# Patient Record
Sex: Female | Born: 1945 | Race: White | Hispanic: No | Marital: Married | Smoking: Never smoker
Health system: Southern US, Community
[De-identification: ages and names within clinical notes are randomized; demographics above are authoritative.]

## PROBLEM LIST (undated history)

## (undated) DIAGNOSIS — H269 Unspecified cataract: Secondary | ICD-10-CM

## (undated) DIAGNOSIS — E0500 Thyrotoxicosis with diffuse goiter without thyrotoxic crisis or storm: Secondary | ICD-10-CM

## (undated) DIAGNOSIS — I10 Essential (primary) hypertension: Secondary | ICD-10-CM

## (undated) DIAGNOSIS — T8859XA Other complications of anesthesia, initial encounter: Secondary | ICD-10-CM

## (undated) DIAGNOSIS — I4891 Unspecified atrial fibrillation: Secondary | ICD-10-CM

## (undated) DIAGNOSIS — N6019 Diffuse cystic mastopathy of unspecified breast: Secondary | ICD-10-CM

## (undated) DIAGNOSIS — E669 Obesity, unspecified: Secondary | ICD-10-CM

## (undated) DIAGNOSIS — S4291XA Fracture of right shoulder girdle, part unspecified, initial encounter for closed fracture: Secondary | ICD-10-CM

## (undated) DIAGNOSIS — Z87442 Personal history of urinary calculi: Secondary | ICD-10-CM

## (undated) DIAGNOSIS — T884XXA Failed or difficult intubation, initial encounter: Secondary | ICD-10-CM

## (undated) DIAGNOSIS — D649 Anemia, unspecified: Secondary | ICD-10-CM

## (undated) DIAGNOSIS — K8020 Calculus of gallbladder without cholecystitis without obstruction: Secondary | ICD-10-CM

## (undated) DIAGNOSIS — T4145XA Adverse effect of unspecified anesthetic, initial encounter: Secondary | ICD-10-CM

## (undated) HISTORY — PX: TUBAL LIGATION: SHX77

## (undated) HISTORY — PX: ESOPHAGOGASTRODUODENOSCOPY ENDOSCOPY: SHX5814

## (undated) HISTORY — DX: Anemia, unspecified: D64.9

## (undated) HISTORY — DX: Fracture of right shoulder girdle, part unspecified, initial encounter for closed fracture: S42.91XA

## (undated) HISTORY — DX: Obesity, unspecified: E66.9

## (undated) HISTORY — DX: Unspecified atrial fibrillation: I48.91

## (undated) HISTORY — DX: Unspecified cataract: H26.9

## (undated) HISTORY — DX: Diffuse cystic mastopathy of unspecified breast: N60.19

## (undated) HISTORY — DX: Essential (primary) hypertension: I10

## (undated) HISTORY — DX: Thyrotoxicosis with diffuse goiter without thyrotoxic crisis or storm: E05.00

---

## 1998-12-26 ENCOUNTER — Other Ambulatory Visit: Admission: RE | Admit: 1998-12-26 | Discharge: 1998-12-26 | Payer: Self-pay | Admitting: *Deleted

## 1999-08-29 ENCOUNTER — Other Ambulatory Visit: Admission: RE | Admit: 1999-08-29 | Discharge: 1999-08-29 | Payer: Self-pay | Admitting: Family Medicine

## 2000-10-15 ENCOUNTER — Other Ambulatory Visit: Admission: RE | Admit: 2000-10-15 | Discharge: 2000-10-15 | Payer: Self-pay | Admitting: Family Medicine

## 2004-07-09 ENCOUNTER — Encounter (HOSPITAL_COMMUNITY): Admission: RE | Admit: 2004-07-09 | Discharge: 2004-10-07 | Payer: Self-pay | Admitting: Family Medicine

## 2004-08-16 ENCOUNTER — Encounter: Admission: RE | Admit: 2004-08-16 | Discharge: 2004-08-16 | Payer: Self-pay | Admitting: General Surgery

## 2005-01-25 ENCOUNTER — Observation Stay (HOSPITAL_COMMUNITY): Admission: RE | Admit: 2005-01-25 | Discharge: 2005-01-25 | Payer: Self-pay | Admitting: *Deleted

## 2005-01-25 ENCOUNTER — Encounter (INDEPENDENT_AMBULATORY_CARE_PROVIDER_SITE_OTHER): Payer: Self-pay | Admitting: *Deleted

## 2005-02-28 ENCOUNTER — Encounter: Admission: RE | Admit: 2005-02-28 | Discharge: 2005-02-28 | Payer: Self-pay | Admitting: Family Medicine

## 2005-04-10 ENCOUNTER — Ambulatory Visit: Payer: Self-pay | Admitting: Family Medicine

## 2005-08-01 ENCOUNTER — Encounter: Admission: RE | Admit: 2005-08-01 | Discharge: 2005-08-01 | Payer: Self-pay | Admitting: *Deleted

## 2005-08-27 ENCOUNTER — Ambulatory Visit: Payer: Self-pay | Admitting: Family Medicine

## 2005-09-16 ENCOUNTER — Ambulatory Visit: Payer: Self-pay | Admitting: Family Medicine

## 2006-09-05 ENCOUNTER — Encounter: Admission: RE | Admit: 2006-09-05 | Discharge: 2006-09-05 | Payer: Self-pay | Admitting: Family Medicine

## 2006-09-09 ENCOUNTER — Ambulatory Visit: Payer: Self-pay | Admitting: Family Medicine

## 2006-10-13 ENCOUNTER — Ambulatory Visit: Payer: Self-pay | Admitting: Family Medicine

## 2007-09-25 ENCOUNTER — Ambulatory Visit: Payer: Self-pay | Admitting: Family Medicine

## 2008-01-18 ENCOUNTER — Encounter (INDEPENDENT_AMBULATORY_CARE_PROVIDER_SITE_OTHER): Payer: Self-pay | Admitting: *Deleted

## 2008-04-26 ENCOUNTER — Ambulatory Visit: Payer: Self-pay | Admitting: Family Medicine

## 2008-04-26 DIAGNOSIS — E038 Other specified hypothyroidism: Secondary | ICD-10-CM | POA: Insufficient documentation

## 2008-04-26 DIAGNOSIS — I10 Essential (primary) hypertension: Secondary | ICD-10-CM | POA: Insufficient documentation

## 2008-04-26 DIAGNOSIS — E669 Obesity, unspecified: Secondary | ICD-10-CM | POA: Insufficient documentation

## 2008-04-26 DIAGNOSIS — E0590 Thyrotoxicosis, unspecified without thyrotoxic crisis or storm: Secondary | ICD-10-CM | POA: Insufficient documentation

## 2008-04-27 LAB — CONVERTED CEMR LAB
ALT: 15 units/L (ref 0–35)
AST: 20 units/L (ref 0–37)
Albumin: 3.7 g/dL (ref 3.5–5.2)
Alkaline Phosphatase: 83 units/L (ref 39–117)
BUN: 13 mg/dL (ref 6–23)
Basophils Absolute: 0 10*3/uL (ref 0.0–0.1)
Basophils Relative: 0.8 % (ref 0.0–1.0)
Bilirubin, Direct: 0.1 mg/dL (ref 0.0–0.3)
CO2: 29 meq/L (ref 19–32)
Calcium: 8.9 mg/dL (ref 8.4–10.5)
Chloride: 107 meq/L (ref 96–112)
Cholesterol: 160 mg/dL (ref 0–200)
Creatinine, Ser: 0.9 mg/dL (ref 0.4–1.2)
Eosinophils Absolute: 0.2 10*3/uL (ref 0.0–0.7)
Eosinophils Relative: 4.3 % (ref 0.0–5.0)
Free T4: 1.1 ng/dL (ref 0.6–1.6)
GFR calc Af Amer: 82 mL/min
GFR calc non Af Amer: 67 mL/min
Glucose, Bld: 92 mg/dL (ref 70–99)
HCT: 43.3 % (ref 36.0–46.0)
HDL: 35.5 mg/dL — ABNORMAL LOW (ref >39.0)
Hemoglobin: 14.8 g/dL (ref 12.0–15.0)
LDL Cholesterol: 102 mg/dL — ABNORMAL HIGH (ref 0–99)
Lymphocytes Relative: 29.2 % (ref 12.0–46.0)
MCHC: 34.3 g/dL (ref 30.0–36.0)
MCV: 88.4 fL (ref 78.0–100.0)
Monocytes Absolute: 0.5 10*3/uL (ref 0.1–1.0)
Monocytes Relative: 9.3 % (ref 3.0–12.0)
Neutro Abs: 3.1 10*3/uL (ref 1.4–7.7)
Neutrophils Relative %: 56.4 % (ref 43.0–77.0)
Phosphorus: 3.5 mg/dL (ref 2.3–4.6)
Platelets: 207 10*3/uL (ref 150–400)
Potassium: 4.1 meq/L (ref 3.5–5.1)
RBC: 4.9 M/uL (ref 3.87–5.11)
RDW: 12.4 % (ref 11.5–14.6)
Sodium: 141 meq/L (ref 135–145)
TSH: 1.31 microintl units/mL (ref 0.35–5.50)
Total Bilirubin: 0.7 mg/dL (ref 0.3–1.2)
Total CHOL/HDL Ratio: 4.5
Total Protein: 6.2 g/dL (ref 6.0–8.3)
Triglycerides: 115 mg/dL (ref 0–149)
VLDL: 23 mg/dL (ref 0–40)
WBC: 5.4 10*3/uL (ref 4.5–10.5)

## 2008-05-17 ENCOUNTER — Encounter: Admission: RE | Admit: 2008-05-17 | Discharge: 2008-05-17 | Payer: Self-pay | Admitting: Family Medicine

## 2008-05-18 ENCOUNTER — Encounter (INDEPENDENT_AMBULATORY_CARE_PROVIDER_SITE_OTHER): Payer: Self-pay | Admitting: *Deleted

## 2008-08-22 ENCOUNTER — Ambulatory Visit: Payer: Self-pay | Admitting: Family Medicine

## 2009-06-19 ENCOUNTER — Encounter: Admission: RE | Admit: 2009-06-19 | Discharge: 2009-06-19 | Payer: Self-pay | Admitting: Family Medicine

## 2009-06-21 ENCOUNTER — Ambulatory Visit: Payer: Self-pay | Admitting: Family Medicine

## 2009-06-21 ENCOUNTER — Encounter (INDEPENDENT_AMBULATORY_CARE_PROVIDER_SITE_OTHER): Payer: Self-pay | Admitting: *Deleted

## 2009-06-21 DIAGNOSIS — M8580 Other specified disorders of bone density and structure, unspecified site: Secondary | ICD-10-CM | POA: Insufficient documentation

## 2009-06-23 ENCOUNTER — Encounter: Payer: Self-pay | Admitting: Family Medicine

## 2009-06-23 LAB — CONVERTED CEMR LAB
ALT: 18 units/L (ref 0–35)
AST: 22 units/L (ref 0–37)
Albumin: 4.3 g/dL (ref 3.5–5.2)
Alkaline Phosphatase: 89 units/L (ref 39–117)
BUN: 15 mg/dL (ref 6–23)
Basophils Absolute: 0 10*3/uL (ref 0.0–0.1)
Basophils Relative: 0 % (ref 0.0–3.0)
Bilirubin, Direct: 0.1 mg/dL (ref 0.0–0.3)
CO2: 27 meq/L (ref 19–32)
Calcium: 9.3 mg/dL (ref 8.4–10.5)
Chloride: 108 meq/L (ref 96–112)
Cholesterol: 177 mg/dL (ref 0–200)
Creatinine, Ser: 1 mg/dL (ref 0.4–1.2)
Eosinophils Absolute: 0.2 10*3/uL (ref 0.0–0.7)
Eosinophils Relative: 2.9 % (ref 0.0–5.0)
Glucose, Bld: 82 mg/dL (ref 70–99)
HCT: 45.7 % (ref 36.0–46.0)
HDL: 43.9 mg/dL (ref >39.00)
Hemoglobin: 15.3 g/dL — ABNORMAL HIGH (ref 12.0–15.0)
LDL Cholesterol: 115 mg/dL — ABNORMAL HIGH (ref 0–99)
Lymphocytes Relative: 28.4 % (ref 12.0–46.0)
Lymphs Abs: 2 10*3/uL (ref 0.7–4.0)
MCHC: 33.5 g/dL (ref 30.0–36.0)
MCV: 91.4 fL (ref 78.0–100.0)
Monocytes Absolute: 0.3 10*3/uL (ref 0.1–1.0)
Monocytes Relative: 4.4 % (ref 3.0–12.0)
Neutro Abs: 4.7 10*3/uL (ref 1.4–7.7)
Neutrophils Relative %: 64.3 % (ref 43.0–77.0)
Phosphorus: 4.2 mg/dL (ref 2.3–4.6)
Platelets: 213 10*3/uL (ref 150.0–400.0)
Potassium: 4 meq/L (ref 3.5–5.1)
RBC: 5 M/uL (ref 3.87–5.11)
RDW: 13.1 % (ref 11.5–14.6)
Sodium: 142 meq/L (ref 135–145)
TSH: 1.13 microintl units/mL (ref 0.35–5.50)
Total Bilirubin: 0.8 mg/dL (ref 0.3–1.2)
Total CHOL/HDL Ratio: 4
Total Protein: 7 g/dL (ref 6.0–8.3)
Triglycerides: 90 mg/dL (ref 0.0–149.0)
VLDL: 18 mg/dL (ref 0.0–40.0)
Vit D, 25-Hydroxy: 27 ng/mL — ABNORMAL LOW (ref 30–89)
WBC: 7.2 10*3/uL (ref 4.5–10.5)

## 2009-10-12 ENCOUNTER — Encounter: Payer: Self-pay | Admitting: Family Medicine

## 2010-07-11 ENCOUNTER — Encounter (INDEPENDENT_AMBULATORY_CARE_PROVIDER_SITE_OTHER): Payer: Self-pay | Admitting: *Deleted

## 2010-08-14 ENCOUNTER — Encounter: Payer: Self-pay | Admitting: Family Medicine

## 2010-08-16 ENCOUNTER — Telehealth: Payer: Self-pay | Admitting: Family Medicine

## 2010-09-21 ENCOUNTER — Telehealth (INDEPENDENT_AMBULATORY_CARE_PROVIDER_SITE_OTHER): Payer: Self-pay | Admitting: *Deleted

## 2010-09-25 ENCOUNTER — Ambulatory Visit: Payer: Self-pay | Admitting: Family Medicine

## 2010-09-25 LAB — CONVERTED CEMR LAB
ALT: 16 units/L (ref 0–35)
AST: 18 units/L (ref 0–37)
Albumin: 3.9 g/dL (ref 3.5–5.2)
Alkaline Phosphatase: 81 units/L (ref 39–117)
BUN: 13 mg/dL (ref 6–23)
Basophils Absolute: 0 10*3/uL (ref 0.0–0.1)
Basophils Relative: 0.8 % (ref 0.0–3.0)
Bilirubin, Direct: 0.1 mg/dL (ref 0.0–0.3)
CO2: 27 meq/L (ref 19–32)
Calcium: 9 mg/dL (ref 8.4–10.5)
Chloride: 106 meq/L (ref 96–112)
Cholesterol: 169 mg/dL (ref 0–200)
Creatinine, Ser: 0.8 mg/dL (ref 0.4–1.2)
Eosinophils Absolute: 0.2 10*3/uL (ref 0.0–0.7)
Eosinophils Relative: 3.2 % (ref 0.0–5.0)
GFR calc non Af Amer: 74.32 mL/min (ref >60)
Glucose, Bld: 119 mg/dL — ABNORMAL HIGH (ref 70–99)
HCT: 43 % (ref 36.0–46.0)
HDL: 47.7 mg/dL (ref >39.00)
Hemoglobin: 14.9 g/dL (ref 12.0–15.0)
LDL Cholesterol: 106 mg/dL — ABNORMAL HIGH (ref 0–99)
Lymphocytes Relative: 27 % (ref 12.0–46.0)
Lymphs Abs: 1.6 10*3/uL (ref 0.7–4.0)
MCHC: 34.6 g/dL (ref 30.0–36.0)
MCV: 89.6 fL (ref 78.0–100.0)
Monocytes Absolute: 0.3 10*3/uL (ref 0.1–1.0)
Monocytes Relative: 5.9 % (ref 3.0–12.0)
Neutro Abs: 3.7 10*3/uL (ref 1.4–7.7)
Neutrophils Relative %: 63.1 % (ref 43.0–77.0)
Phosphorus: 3.1 mg/dL (ref 2.3–4.6)
Platelets: 220 10*3/uL (ref 150.0–400.0)
Potassium: 4.1 meq/L (ref 3.5–5.1)
RBC: 4.8 M/uL (ref 3.87–5.11)
RDW: 13.6 % (ref 11.5–14.6)
Sodium: 139 meq/L (ref 135–145)
TSH: 1.61 microintl units/mL (ref 0.35–5.50)
Total Bilirubin: 0.4 mg/dL (ref 0.3–1.2)
Total CHOL/HDL Ratio: 4
Total Protein: 6.2 g/dL (ref 6.0–8.3)
Triglycerides: 77 mg/dL (ref 0.0–149.0)
VLDL: 15.4 mg/dL (ref 0.0–40.0)
WBC: 5.9 10*3/uL (ref 4.5–10.5)

## 2010-09-26 LAB — CONVERTED CEMR LAB: Vit D, 25-Hydroxy: 33 ng/mL (ref 30–89)

## 2010-09-28 ENCOUNTER — Other Ambulatory Visit: Admission: RE | Admit: 2010-09-28 | Discharge: 2010-09-28 | Payer: Self-pay | Admitting: Family Medicine

## 2010-09-28 ENCOUNTER — Ambulatory Visit: Payer: Self-pay | Admitting: Family Medicine

## 2010-09-28 DIAGNOSIS — R7309 Other abnormal glucose: Secondary | ICD-10-CM | POA: Insufficient documentation

## 2010-09-28 LAB — CONVERTED CEMR LAB: Pap Smear: NORMAL

## 2010-10-08 ENCOUNTER — Encounter: Payer: Self-pay | Admitting: Family Medicine

## 2010-10-08 ENCOUNTER — Encounter (INDEPENDENT_AMBULATORY_CARE_PROVIDER_SITE_OTHER): Payer: Self-pay | Admitting: *Deleted

## 2010-10-08 LAB — CONVERTED CEMR LAB: Pap Smear: NEGATIVE

## 2010-10-09 ENCOUNTER — Ambulatory Visit: Payer: Self-pay | Admitting: Internal Medicine

## 2010-10-09 ENCOUNTER — Encounter: Payer: Self-pay | Admitting: Family Medicine

## 2010-10-31 ENCOUNTER — Encounter (INDEPENDENT_AMBULATORY_CARE_PROVIDER_SITE_OTHER): Payer: Self-pay | Admitting: *Deleted

## 2010-11-02 ENCOUNTER — Encounter
Admission: RE | Admit: 2010-11-02 | Discharge: 2010-11-02 | Payer: Self-pay | Source: Home / Self Care | Attending: Family Medicine | Admitting: Family Medicine

## 2010-11-06 ENCOUNTER — Encounter: Payer: Self-pay | Admitting: Family Medicine

## 2010-11-06 ENCOUNTER — Encounter (INDEPENDENT_AMBULATORY_CARE_PROVIDER_SITE_OTHER): Payer: Self-pay | Admitting: *Deleted

## 2010-11-26 ENCOUNTER — Telehealth: Payer: Self-pay | Admitting: Family Medicine

## 2010-11-26 DIAGNOSIS — R4589 Other symptoms and signs involving emotional state: Secondary | ICD-10-CM | POA: Insufficient documentation

## 2010-12-04 ENCOUNTER — Ambulatory Visit
Admission: RE | Admit: 2010-12-04 | Discharge: 2010-12-04 | Payer: Self-pay | Source: Home / Self Care | Attending: Psychology | Admitting: Psychology

## 2010-12-11 ENCOUNTER — Ambulatory Visit: Admit: 2010-12-11 | Payer: Self-pay | Admitting: Psychology

## 2010-12-11 NOTE — Assessment & Plan Note (Signed)
Summary: CPX/CLE   Vital Signs:  Patient profile:   65 year old female Height:      60.5 inches Weight:      168.75 pounds BMI:     32.53 Temp:     98 degrees F oral Pulse rate:   84 / minute Pulse rhythm:   regular BP sitting:   124 / 80  (left arm) Cuff size:   regular  Vitals Entered By: Lewanda Rife LPN (September 28, 2010 10:43 AM) CC: chronic med check up   Vision Screening:Left eye with correction: 20 / 20 Right eye with correction: 20 / 20 Both eyes with correction: 20 / 20        Vision Entered By: Lewanda Rife LPN (September 28, 2010 10:49 AM)  Hearing Screen 25db HL: Left  500 hz: 25db 1000 hz: 25db 2000 hz: 25db 4000 hz: 25db Right  500 hz: 25db 1000 hz: 25db 2000 hz: 20db 4000 hz: 25db    History of Present Illness: here for check up of chronic med problems and to review her health mt list   is feeling good no new medical issues   wt is down 22 lb with bmi of 32 was a stressful year and also cutting down eating  gained and then lost 30 lb   Htn well controlled with 124/80 today   mam 8/10-- did not get one this summer  she wants to make her own appt self exam   pap-- about 3 years ago  did see gyn  abn pap years and years ago with cryo  time for 3 year pap today   Td- 2004  had flu shot  pneumovax -- is interested today  never had shingles - is candidate   osteopenia -- dexa over 2y ago  is not taking ca and d for bones  vit D is 33   hx of graves dz tsh nl today  lipids are improved with trig 77 adnHDL 47 and LDL 106  sugar fasting was 119 father had mild DM  she is a big sugar eater - but uses splenda for sweetening things loves sweets      Allergies (verified): No Known Drug Allergies  Past History:  Past Medical History: Last updated: 04/26/2008 graves dz-- s/p tx with PTU iron def anemia obesity HTN fibrocystic breasts   endo- [facility]-- Idolina Primer   Family History: Last updated: 04/26/2008 F-  DM/HTN M HTN B HTN, prostate cancer  B- CAD with bypass   Social History: Last updated: 04/26/2008 never smoked  no alcohol 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 Eyes:  Denies blurring and eye irritation. CV:  Denies chest pain or discomfort, lightheadness, palpitations, and shortness of breath with exertion. Resp:  Denies cough, shortness of breath, and wheezing. GI:  Denies abdominal pain, change in bowel habits, indigestion, nausea, and vomiting. GU:  Denies abnormal vaginal bleeding, discharge, and dysuria. MS:  Denies joint pain, joint redness, and joint swelling. Derm:  Denies itching, lesion(s), poor wound healing, and rash. Neuro:  Denies numbness and tingling. Psych:  Denies anxiety and depression. Endo:  Denies cold intolerance, excessive thirst, excessive urination, and heat intolerance. Heme:  Denies abnormal bruising and bleeding.  Physical Exam  General:  overweight but generally well appearing wt loss  noted   Head:  normocephalic, atraumatic, and no abnormalities observed.   Eyes:  vision grossly intact, pupils equal, pupils round, and pupils reactive to light.  no conjunctival pallor, injection or icterus  Ears:  R ear normal and L ear normal.   Nose:  no nasal discharge.   Mouth:  pharynx pink and moist.   Neck:  supple with full rom and no masses or thyromegally, no JVD or carotid bruit  Chest Wall:  No deformities, masses, or tenderness noted. Breasts:  No mass, nodules, thickening, tenderness, bulging, retraction, inflamation, nipple discharge or skin changes noted.   Lungs:  Normal respiratory effort, chest expands symmetrically. Lungs are clear to auscultation, no crackles or wheezes. Heart:  Normal rate and regular rhythm. S1 and S2  normal without gallop, murmur, click, rub or other extra sounds. Abdomen:  Bowel sounds positive,abdomen soft and non-tender without masses, organomegaly or hernias noted. no renal bruits Genitalia:  Normal introitus for age, no external lesions, no vaginal discharge, mucosa pink and moist, no vaginal or cervical lesions, no vaginal atrophy, no friaility or hemorrhage, normal uterus size and position, no adnexal masses or tenderness Msk:  No deformity or scoliosis noted of thoracic or lumbar spine.  no acute joint changes petie frame Pulses:  R and L carotid,radial,femoral,dorsalis pedis and posterior tibial pulses are full and equal bilaterally Extremities:  No clubbing, cyanosis, edema, or deformity noted with normal full range of motion of all joints.   Neurologic:  sensation intact to light touch, gait normal, and DTRs symmetrical and normal.   Skin:  Intact without suspicious lesions or rashes lentigos diffusely  Cervical Nodes:  No lymphadenopathy noted Axillary Nodes:  No palpable lymphadenopathy Inguinal Nodes:  No significant adenopathy Psych:  normal affect, talkative and pleasant    Impression & Recommendations:  Problem # 1:  OSTEOPENIA (ICD-733.90) Assessment Unchanged due for dexa  rev ca and vit d- which she is not taking  rev vit D level - want to inc that  disc wt bearing exercise The following medications were removed from the medication list:    Vitamin D (ergocalciferol) 50000 Unit Caps (Ergocalciferol) .Marland Kitchen... Take one by mouth once a week for 10 weeks  Orders: Radiology Referral (Radiology)  Problem # 2:  HYPERTHYROIDISM (ICD-242.90) Assessment: Unchanged no changes and tsh is theraputic  rev lab with pt  Problem # 3:  HYPERTENSION, ESSENTIAL NOS (ICD-401.9) Assessment: Unchanged  this is well controlled- esp with wt loss  no change in med labs rev with pt  lifestyle is also improved - urged to get regular exercise  Her updated medication list for this  problem includes:    Sular 34 Mg Tb24 (Nisoldipine) .Marland Kitchen... Take one by mouth once daily    Fosinopril Sodium 20 Mg Tabs (Fosinopril sodium) .Marland Kitchen... Take one by mouth daily  BP today: 124/80 Prior BP: 110/80 (06/21/2009)  Labs Reviewed: K+: 4.1 (09/25/2010) Creat: : 0.8 (09/25/2010)   Chol: 169 (09/25/2010)   HDL: 47.70 (09/25/2010)   LDL: 106 (09/25/2010)   TG: 77.0 (09/25/2010)  Problem # 4:  HYPERGLYCEMIA (ICD-790.29) Assessment: New new and mild fasting hyperglycemia we need to follow disc healthy diet (low simple sugar/ choose complex carbs/ low sat fat) diet and exercise in detail  will try low glycemic diet - recommended sugarbusters book keep loosing wt check AIC 3 mo and comment  Problem # 5:  ROUTINE GYNECOLOGICAL EXAMINATION (ICD-V72.31) Assessment: Comment Only  exam done without symptoms or problems  will keep on  3 year schedule if no problems  Orders: Obtaining Screening PAP Smear (F6213) Pelvic & Breast Exam ( Medicare)  (Y8657)  Complete Medication List: 1)  Sular 34 Mg Tb24 (Nisoldipine) .... Take one by mouth once daily 2)  Fosinopril Sodium 20 Mg Tabs (Fosinopril sodium) .... Take one by mouth daily 3)  Fish Oil 1000 Mg Caps (Omega-3 fatty acids) .... Three capsules by mouth daily 4)  Vitamin B-12 1000 Mcg Tabs (Cyanocobalamin) .... Take 1 tablet by mouth once a day 5)  Aleve 220 Mg Tabs (Naproxen sodium) .... Otc as directed. 6)  Calcium  .Marland KitchenMarland Kitchen. 1200-1500 mg daily 7)  Vitamin D  .Marland Kitchen.. 1000 international units daily  Other Orders: Pneumococcal Vaccine (84696) Admin 1st Vaccine (29528)  Patient Instructions: 1)  get the book SUGAR BUSTERS   2)  try to eat a low glycemic diet  3)  keep working on weight loss  4)  you are overdue for a mammogram - please schedule that yourself  5)  if you are interested in shingles vaccine - call your insurance and then call us in 1 month or more to schedule  6)  the current recommendation for calcium intake is 1200-1500 mg  daily with 1000 IU of vitamin D  7)  we will schedule bone density test at check out  8)  schedule non fasting labs 3 months after better diet for [facility] for hyperglycemia  9)  pneumonia vaccine today    Orders Added: 1)  Obtaining Screening PAP Smear [Q0091] 2)  Pelvic & Breast Exam ( Medicare)  [G0101] 3)  Radiology Referral [Radiology] 4)  Pneumococcal Vaccine [90732] 5)  Admin 1st Vaccine [90471] 6)  Est. Patient Level IV [41324]   Immunizations Administered:  Pneumonia Vaccine:    Vaccine Type: Pneumovax    Site: left deltoid    Mfr: Merck    Dose: 0.5 ml    Route: IM    Given by: Lewanda Rife LPN    Exp. Date: 03/07/2012    Lot #: [id number]    VIS given: 10/16/09 version given September 28, 2010.   Immunizations Administered:  Pneumonia Vaccine:    Vaccine Type: Pneumovax    Site: left deltoid    Mfr: Merck    Dose: 0.5 ml    Route: IM    Given by: Lewanda Rife LPN    Exp. Date: 03/07/2012    Lot #: [id number]    VIS given: 10/16/09 version given September 28, 2010.  Current Allergies (reviewed today): No known allergies    Preventive Care Screening  Colonoscopy:    Date:  06/11/2004    Next Due:  06/2014    Results:  normal

## 2010-12-11 NOTE — Miscellaneous (Signed)
Summary: flu shot at [facility]   Clinical Lists Changes  Observations: Added new observation of FLU VAX: Historical (07/11/2010 15:30)      Immunization History:  Influenza Immunization History:    Influenza:  historical (07/11/2010) Pt received flu vaccine at [facility] [address] in [place].              Lowella Petties CMA  July 11, 2010 3:31 PM

## 2010-12-11 NOTE — Letter (Signed)
Summary: Results Follow up Letter  [place] at [facility]  [address] [place], [place] [address]   Phone: [phone]  Fax: [phone]    10/08/2010 MRN: [id number]  [facility] [address] [place], [place]  [address]  Dear Ms. Carrender,  The following are the results of your recent test(s):  Test         Result    Pap Smear:        Normal __X___  Not Normal _____ Comments: ______________________________________________________ Cholesterol: LDL(Bad cholesterol):         Your goal is less than:         HDL (Good cholesterol):       Your goal is more than: Comments:  ______________________________________________________ Mammogram:        Normal _____  Not Normal _____ Comments:  ___________________________________________________________________ Hemoccult:        Normal _____  Not normal _______ Comments:    _____________________________________________________________________ Other Tests:    We routinely do not discuss normal results over the telephone.  If you desire a copy of the results, or you have any questions about this information we can discuss them at your next office visit.   Sincerely,      Janee Morn, CMA for Dr. Roxy Manns

## 2010-12-11 NOTE — Miscellaneous (Signed)
Summary: BONE DENSITY  Clinical Lists Changes  Orders: Added new Test order of T-Bone Densitometry (77080) - Signed Added new Test order of T-Lumbar Vertebral Assessment (77082) - Signed 

## 2010-12-11 NOTE — Miscellaneous (Signed)
Summary: BONE DENSITY  Clinical Lists Changes 

## 2010-12-11 NOTE — Progress Notes (Signed)
----   Converted from flag ---- ---- 09/20/2010 6:45 PM, Colon Flattery Tower MD wrote: please check lipid/ renal / hepatic/ cbc with diff/ tsh/ vit D for 401.1 and 733.0   ---- 09/20/2010 1:50 PM, Liane Comber CMA (AAMA) wrote: Lab orders please! Good Morning! This pt is scheduled for cpx labs Tuesday, which labs to draw and dx codes to use? Thanks Tasha ------------------------------

## 2010-12-11 NOTE — Progress Notes (Signed)
Summary: refill request for sular, fosinopril  Phone Note Refill Request Message from:  Fax from Pharmacy  Refills Requested: Medication #1:  SULAR 34 MG  TB24 take one by mouth once daily  Medication #2:  FOSINOPRIL SODIUM 20 MG TABS take one by mouth daily Forms from right source are on your shelf.  Initial call taken by: Lowella Petties CMA,  August 16, 2010 9:26 AM  Follow-up for Phone Call        she has f/u with me in nov form done and in nurse in box  Follow-up by: Judith Part MD,  August 16, 2010 12:39 PM  Additional Follow-up for Phone Call Additional follow up Details #1::        Forms faxed.            Lowella Petties CMA  August 16, 2010 2:17 PM     Prescriptions: FOSINOPRIL SODIUM 20 MG TABS (FOSINOPRIL SODIUM) take one by mouth daily  #90 x 3   Entered and Authorized by:   Judith Part MD   Signed by:   Lowella Petties CMA on 08/16/2010   Method used:   Historical   RxID:   [id number] SULAR 34 MG  TB24 (NISOLDIPINE) take one by mouth once daily  #90 x 3   Entered and Authorized by:   Judith Part MD   Signed by:   Lowella Petties CMA on 08/16/2010   Method used:   Historical   RxID:   [id number]

## 2010-12-11 NOTE — Medication Information (Signed)
Summary: Nisoldipine/[facility]  Nisoldipine/[facility]   Imported By: Lanelle Bal 08/21/2010 12:17:06  _____________________________________________________________________  External Attachment:    Type:   Image     Comment:   External Document

## 2010-12-11 NOTE — Miscellaneous (Signed)
   Clinical Lists Changes  Observations: Added new observation of PAP SMEAR: normal (09/28/2010 13:19)      Preventive Care Screening  Pap Smear:    Date:  09/28/2010    Results:  normal

## 2010-12-13 NOTE — Letter (Signed)
Summary: Results Follow up Letter  [name] at [facility]  [address] [place], [place] [address]   Phone: [phone]  Fax: [phone]    10/31/2010 MRN: [id number]    [facility] [address] [place], [place]  [address]    Dear [name],  The following are the results of your recent test(s):  Test         Result    Pap Smear:        Normal _____  Not Normal _____ Comments: ______________________________________________________ Cholesterol: LDL(Bad cholesterol):         Your goal is less than:         HDL (Good cholesterol):       Your goal is more than: Comments:  ______________________________________________________ Mammogram:        Normal _____  Not Normal _____ Comments:  ___________________________________________________________________ Hemoccult:        Normal _____  Not normal _______ Comments:    _____________________________________________________________________ Other Tests:   Dexa scan shows osteopenia in the hip.  There  is a normal score in the spine.  It is  important to take the Calcium and Vitamin  D and exercise.  We can discuss this  in more detail at your next appointment.     We routinely do not discuss normal results over the telephone.  If you desire a copy of the results, or you have any questions about this information we can discuss them at your next office visit.   Sincerely,   Marne A. Milinda Antis, M.D.  MAT:lsf

## 2010-12-13 NOTE — Progress Notes (Signed)
Summary: wants referral to see Dr. Laymond Purser   Phone Note Call from Patient Call back at Home Phone [phone] Call back at [phone]   Caller: Patient Call For: Judith Part MD Summary of Call: Patient says that she is under alot of stress, she is having problems with her marriage, etc. She is asking if she can get a referral to see Dr. Laymond Purser.  Initial call taken by: Melody Comas,  November 26, 2010 1:40 PM  Follow-up for Phone Call        I will go ahead and do ref for stress reaction for [facility] Follow-up by: Judith Part MD,  November 26, 2010 1:59 PM  New Problems: STRESS REACTION, ACUTE, WITH EMOTIONAL DISTURBANCE (ICD-308.0)   New Problems: STRESS REACTION, ACUTE, WITH EMOTIONAL DISTURBANCE (ICD-308.0)

## 2010-12-13 NOTE — Letter (Signed)
Summary: Results Follow up Letter  [place] at [facility]  [address] [place], [place] [address]   Phone: [phone]  Fax: [phone]    11/06/2010 MRN: [id number]  [facility] [address] [place], [place]  [address]  Dear Kerry Simpson,  The following are the results of your recent test(s):  Test         Result    Pap Smear:        Normal _____  Not Normal _____ Comments: ______________________________________________________ Cholesterol: LDL(Bad cholesterol):         Your goal is less than:         HDL (Good cholesterol):       Your goal is more than: Comments:  ______________________________________________________ Mammogram:        Normal __X__  Not Normal _____ Comments: Repeat in 1 year  ___________________________________________________________________ Hemoccult:        Normal _____  Not normal _______ Comments:    _____________________________________________________________________ Other Tests:    We routinely do not discuss normal results over the telephone.  If you desire a copy of the results, or you have any questions about this information we can discuss them at your next office visit.   Sincerely,      Sharilyn Sites for Dr. Roxy Manns

## 2010-12-13 NOTE — Miscellaneous (Signed)
Summary: Mammogram update   Clinical Lists Changes  Observations: Added new observation of MAMMO DUE: 11/2011 (11/06/2010 13:28) Added new observation of MAMMOGRAM: normal (11/02/2010 13:28)      Preventive Care Screening  Mammogram:    Date:  11/02/2010    Next Due:  11/2011    Results:  normal

## 2010-12-19 ENCOUNTER — Ambulatory Visit (INDEPENDENT_AMBULATORY_CARE_PROVIDER_SITE_OTHER): Payer: Medicare Other | Admitting: Psychology

## 2010-12-19 DIAGNOSIS — F4323 Adjustment disorder with mixed anxiety and depressed mood: Secondary | ICD-10-CM

## 2010-12-25 ENCOUNTER — Ambulatory Visit: Payer: Self-pay | Admitting: Psychology

## 2010-12-25 ENCOUNTER — Ambulatory Visit: Payer: Medicare Other | Admitting: Family Medicine

## 2010-12-27 ENCOUNTER — Encounter: Payer: Self-pay | Admitting: Family Medicine

## 2010-12-27 ENCOUNTER — Ambulatory Visit (INDEPENDENT_AMBULATORY_CARE_PROVIDER_SITE_OTHER): Payer: Medicare Other | Admitting: Family Medicine

## 2010-12-27 DIAGNOSIS — R4589 Other symptoms and signs involving emotional state: Secondary | ICD-10-CM

## 2011-01-08 NOTE — Assessment & Plan Note (Signed)
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 infidelity (with a friend)- feels very betrayed  married 47 years  she just could not spend much time on her marriage  then son lost her job  daughter is pregnant again  husband is working a lot and no signs of retirement   thinks she may need some medication to get through this  her symptoms include anxiety over going to the events she is at  also wants to isolate herself in general  gets overwhelmed easily inc HR and sweaty/ shaky  occ symptoms while shopping and driving     Allergies (verified): No Known Drug Allergies  Past History:  Past Medical History: Last updated: 10/31/2010 graves dz-- s/p tx with PTU iron def anemia obesity HTN fibrocystic breasts osteopenia    endo- [facility]-- Idolina Primer   Past Surgical History: Last updated: 09/28/2010 fx shoulder dexa osteopenia 05 cervical stenosis  8/05 nl colonoscopy many years ago cervical dysplasia - cryo   Family History: Last updated: 04/26/2008 F- DM/HTN M HTN B HTN, prostate cancer  B- CAD with bypass   Social History: Last updated: 04/26/2008 never smoked  no alcohol 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 Eyes:  Denies blurring and eye irritation. CV:  Denies chest pain or discomfort, lightheadness, and palpitations. Resp:  Denies cough, shortness of breath, and wheezing. GI:  Denies abdominal pain, indigestion, and nausea. GU:  Denies dysuria and urinary frequency. Derm:  Denies rash. Neuro:  Complains of tremors; denies headaches, numbness, and tingling. Psych:  Complains of anxiety, depression, easily tearful, irritability, and panic attacks; denies sense of great danger and suicidal thoughts/plans. Endo:  Denies cold intolerance, excessive thirst, excessive urination, and heat intolerance. Heme:  Denies abnormal bruising and bleeding.  Physical Exam  General:  overwt and anxious appearing  Head:  normocephalic, atraumatic, and no abnormalities observed.   Eyes:  vision grossly intact, pupils equal, pupils round, and pupils reactive to light.  no conjunctival pallor, injection or icterus  Mouth:  pharynx pink and moist.   Neck:  supple with full rom and no masses or thyromegally, no JVD or carotid bruit  Chest Wall:  No deformities, masses, or tenderness noted. Lungs:  Normal respiratory effort, chest expands symmetrically. Lungs are clear to auscultation, no crackles or wheezes. Heart:  Normal rate and regular rhythm. S1 and S2 normal without gallop, murmur, click, rub or other extra sounds. Abdomen:  Bowel sounds positive,abdomen soft and non-tender without masses, organomegaly or hernias  noted. no renal bruits Msk:  No deformity or scoliosis noted of thoracic or lumbar spine.  no acute joint changes petie frame Extremities:  No clubbing, cyanosis, edema, or deformity noted with normal full range of motion of all joints.   Neurologic:  sensation intact to light touch, gait normal, and DTRs symmetrical and normal.  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 Take one by mouth once daily 2)  Fosinopril Sodium 20 Mg Tabs (Fosinopril sodium) .... Take one by mouth daily 3)  Fish Oil 1000 Mg Caps (Omega-3 fatty acids) .... Three capsules by mouth daily 4)  Vitamin B-12 1000 Mcg Tabs (Cyanocobalamin) .... Take 1 tablet by mouth once a day 5)  Aleve 220 Mg Tabs (Naproxen sodium) .... Two tablets by mouth daily 6)  Vitamin D  .Marland Kitchen.. 1000 international units daily 7)  Tums ?mg  .... Chews two tums daily 8)  St Johns Wort ?mg  .... Six capsules by mouth daily 9)  Paxil 10 Mg Tabs (Paroxetine hcl) .Marland Kitchen.. 1 by mouth each evening  Patient Instructions: 1)  continue open conversation with your husband  2)  consider couples counseling in the future  3)  start paxil 10 mg each eve 4)  if side effects or this makes you worse- stop it and let me know  5)  follow up with me in 1 month Prescriptions: PAXIL 10 MG TABS (PAROXETINE HCL) 1 by mouth each evening  #30 x 11   Entered and Authorized by:   Judith Part MD   Signed by:   Judith Part MD on 12/27/2010   Method used:   Electronically to        [id number] (retail)       [address]       [place], [place]  [address]       Ph: [phone]       Fax: [phone]   RxID:   [id number]    Orders Added: 1)  Est. Patient Level IV  [24401]    Current Allergies (reviewed today): No known allergies

## 2011-01-10 ENCOUNTER — Encounter: Payer: Self-pay | Admitting: Family Medicine

## 2011-01-10 LAB — HM COLONOSCOPY

## 2011-01-10 LAB — HM MAMMOGRAPHY

## 2011-01-10 LAB — HM PAP SMEAR

## 2011-01-17 ENCOUNTER — Ambulatory Visit: Payer: Medicare Other | Admitting: Family Medicine

## 2011-01-23 ENCOUNTER — Ambulatory Visit: Payer: Medicare Other | Admitting: Family Medicine

## 2011-02-26 ENCOUNTER — Ambulatory Visit: Payer: Medicare Other | Admitting: Family Medicine

## 2011-03-06 ENCOUNTER — Encounter: Payer: Medicare Other | Admitting: Family Medicine

## 2011-03-29 NOTE — Discharge Summary (Signed)
NAMEHALENA, Kerry Simpson               ACCOUNT NO.:  [id number]   MEDICAL RECORD NO.:  [id number]          PATIENT TYPE:  OBV   LOCATION:  9316                          FACILITY:  [facility]   PHYSICIAN:  Pershing Cox, M.D.DATE OF BIRTH:  [birth year]   DATE OF ADMISSION:  01/25/2005  DATE OF DISCHARGE:  01/25/2005                                 DISCHARGE SUMMARY   ADMISSION DIAGNOSES:  Status post uterine perforation following ultrasound  assisted attempt at cervical dilatation. The patient has postmenopausal  bleeding.   HOSPITAL COURSE:  The patient was admitted from the recovery room to the  floor for close observation. Hemoglobin was obtained in the recovery room  and then on the floor. The patient has been up and about, has no symptoms,  needed no medications for pain, has been voiding spontaneously and is ready  for discharge. A repeat hemoglobin on the floor is stable equivalent to the  hemoglobin on admission. I examined the patient this afternoon, her abdomen  is soft. Her vital signs are stable and her laboratory values are stable as  well.   PLAN:  The patient will be discharged to home now and will return to my  office in one week. At that time we will make a disposition regarding  further workup of her postmenopausal bleeding.      MAJ/MEDQ  D:  01/25/2005  T:  01/25/2005  Job:  [id number]

## 2011-03-29 NOTE — Op Note (Signed)
NAMEHANA, Kerry Simpson               ACCOUNT NO.:  [id number]   MEDICAL RECORD NO.:  [id number]          PATIENT TYPE:  OBV   LOCATION:  9399                          FACILITY:  [facility]   PHYSICIAN:  Pershing Cox, M.D.DATE OF BIRTH:  05/03/1946   DATE OF PROCEDURE:  01/25/2005  DATE OF DISCHARGE:                                 OPERATIVE REPORT   PREOPERATIVE DIAGNOSIS:  Postmenopausal bleeding and severe cervical  stenosis.   POSTOPERATIVE DIAGNOSIS:  Uterine perforation.   PROCEDURE:  Examination under anesthesia, endocervical curettage, ultrasound-  guided attempt at cervical dilatation with uterine perforation and  hysteroscopy.   SURGEON:  Pershing Cox, M.D.   ASSISTANT:  None.   ANESTHESIA:  General by LMA.   INDICATIONS FOR PROCEDURE:  Kerry Simpson was seen for her annual  examination in September.  At that time, she had no bleeding for 1 year.  She had a spontaneous bleed in December, bleeding from December 9 to  December 13, and then has continued to spot.  She had recently been  diagnosed with Graves disease and we decided to bring her to our office for  sonogram.  At that time, uterus was 8 weeks in size, retroverted with 0.8 cm  lining.  Endometrial biopsy was obtained at that time, passing to 7 cm.  This showed focal simple hyperplasia without atypia, no carcinoma noted.  After another bleed in January, a sonogram was again thick with a question  of a small polyp.  Hydrosonogram was attempted, but due to significant  cervical stenosis at that time, I was not able to place the balloon.  At  that time, we decided that she should proceed for [facility] hysteroscopy.  The  patient was seen on the day of surgery for placement of a Laminaria to  attempt dilatation of her cervix.  The medium size Laminaria was placed  about 1 cm into her cervix and packed with gauze.  The patient retained this  in her vagina until this morning at which time it fell out while voiding.  She is brought to the operating room today to try to enter her endometrial  canal under ultrasound guidance.   FINDINGS:  The patient's cervix was somewhat patulous and endocervical  curettings were obtained from the lower endocervix.  The uterus was in a  more midline position than it had been before.  Ultrasound was able to  visualize the cervix and uterus throughout the procedure.  Under ultrasound  guidance, the cervix was dilated, but when the hysteroscope was passed, we  were clearly not in the endometrial canal and the procedure was terminated.   DESCRIPTION OF PROCEDURE:  Kerry Simpson was brought to the operating room  with an IV in place.  She received 1 gram of Ancef in the holding area.  She  was brought to the operating room where on the OR table, IV sedation was  administered and an LMA was placed.  She was then placed into Allen stirrups  and examination under anesthesia was performed.  Betadine was used to prep  and the anterior abdominal wall,  perineum, and vagina and she was draped for  a sterile vaginal procedure.  Foley catheter was inserted into her bladder  and 200 mL of saline were instilled into the cavity.  The ultrasound was  done transabdominally.  We could see the cervix easily and a bivalve  speculum was placed.  0.25% Marcaine was injected into the anterior cervix  and a paracervical block was administered at the 3, 4, 7, and 8 positions  using a total volume of 10 mL.  Kevorkian curet was used to obtain  endocervical curettings.  A second tenaculum was placed on the posterior  cervix so that we would have maximal resistance when dilating.  A uterine  sound would not pass through her cervix.  I used a Materials engineer dilator  started at size 11 and then dilating to size 25.  We were able to watch  throughout the procedure and it appeared that we were in the uterine canal  with the dilators.  The hysteroscope was then introduced into the  endometrial cavity and  through-and-through Sorbitol irrigation was  administered.  Watching with the hysteroscope, I was clearly not in the  endometrial cavity.  The procedure was promptly halted and using the  transvaginal sonogram we were able to confirm that there was extravasation  of fluid into the left broad ligament and beneath the bladder.  The bladder  itself was intact throughout the procedure.  The procedure was terminated.  The patient was taken to the recovery room in excellent condition and will  be admitted for observation.      MAJ/MEDQ  D:  01/25/2005  T:  01/25/2005  Job:  [id number]

## 2011-08-15 ENCOUNTER — Encounter: Payer: Self-pay | Admitting: Family Medicine

## 2011-09-08 ENCOUNTER — Other Ambulatory Visit: Payer: Self-pay | Admitting: Family Medicine

## 2011-10-25 ENCOUNTER — Other Ambulatory Visit: Payer: Self-pay | Admitting: Family Medicine

## 2011-10-25 DIAGNOSIS — Z1231 Encounter for screening mammogram for malignant neoplasm of breast: Secondary | ICD-10-CM

## 2011-10-31 ENCOUNTER — Other Ambulatory Visit: Payer: Medicare Other

## 2011-11-06 ENCOUNTER — Ambulatory Visit: Payer: Medicare Other

## 2011-12-12 ENCOUNTER — Other Ambulatory Visit: Payer: Self-pay | Admitting: Family Medicine

## 2011-12-12 NOTE — Telephone Encounter (Signed)
Medication refill... Pharmacy [facility]

## 2011-12-13 ENCOUNTER — Other Ambulatory Visit: Payer: Self-pay | Admitting: *Deleted

## 2011-12-13 MED ORDER — NISOLDIPINE ER 34 MG PO TB24: 34.0000 mg | ORAL_TABLET | Freq: Every day | ORAL | Status: DC

## 2011-12-13 MED ORDER — FOSINOPRIL SODIUM 20 MG PO TABS: 20.0000 mg | ORAL_TABLET | Freq: Every day | ORAL | Status: DC

## 2011-12-13 NOTE — Telephone Encounter (Signed)
Opened in error

## 2011-12-13 NOTE — Telephone Encounter (Signed)
error 

## 2011-12-16 ENCOUNTER — Other Ambulatory Visit: Payer: Medicare Other

## 2011-12-17 ENCOUNTER — Other Ambulatory Visit: Payer: Self-pay | Admitting: *Deleted

## 2011-12-17 MED ORDER — PAROXETINE HCL 10 MG PO TABS: 10.0000 mg | ORAL_TABLET | Freq: Every evening | ORAL | Status: DC

## 2011-12-17 NOTE — Telephone Encounter (Signed)
Patient had cpx appt later this month

## 2011-12-31 ENCOUNTER — Telehealth: Payer: Self-pay | Admitting: Family Medicine

## 2011-12-31 DIAGNOSIS — I10 Essential (primary) hypertension: Secondary | ICD-10-CM

## 2011-12-31 DIAGNOSIS — M949 Disorder of cartilage, unspecified: Secondary | ICD-10-CM

## 2011-12-31 DIAGNOSIS — E0590 Thyrotoxicosis, unspecified without thyrotoxic crisis or storm: Secondary | ICD-10-CM

## 2011-12-31 DIAGNOSIS — R7309 Other abnormal glucose: Secondary | ICD-10-CM

## 2011-12-31 DIAGNOSIS — M899 Disorder of bone, unspecified: Secondary | ICD-10-CM

## 2011-12-31 NOTE — Telephone Encounter (Signed)
Message copied by Judy Pimple on Tue Dec 31, 2011  9:51 PM ------      Message from: Alvina Chou      Created: Wed Dec 25, 2011 12:51 PM      Regarding: lab orders for 2.20.13       Patient is scheduled for CPX labs, please order future labs, Thanks , Camelia Eng

## 2012-01-01 ENCOUNTER — Other Ambulatory Visit (INDEPENDENT_AMBULATORY_CARE_PROVIDER_SITE_OTHER): Payer: Medicare Other

## 2012-01-01 DIAGNOSIS — E0590 Thyrotoxicosis, unspecified without thyrotoxic crisis or storm: Secondary | ICD-10-CM

## 2012-01-01 DIAGNOSIS — R7309 Other abnormal glucose: Secondary | ICD-10-CM

## 2012-01-01 DIAGNOSIS — M899 Disorder of bone, unspecified: Secondary | ICD-10-CM

## 2012-01-01 DIAGNOSIS — I10 Essential (primary) hypertension: Secondary | ICD-10-CM

## 2012-01-01 LAB — COMPREHENSIVE METABOLIC PANEL
ALT: 18 U/L (ref 0–35)
Albumin: 3.8 g/dL (ref 3.5–5.2)
CO2: 26 mEq/L (ref 19–32)
Chloride: 103 mEq/L (ref 96–112)
GFR: 70.07 mL/min (ref >60.00)
Glucose, Bld: 99 mg/dL (ref 70–99)
Potassium: 3.8 mEq/L (ref 3.5–5.1)
Sodium: 141 mEq/L (ref 135–145)
Total Protein: 6.9 g/dL (ref 6.0–8.3)

## 2012-01-01 LAB — TSH: TSH: 1.18 u[IU]/mL (ref 0.35–5.50)

## 2012-01-01 LAB — LIPID PANEL: Total CHOL/HDL Ratio: 3

## 2012-01-01 LAB — CBC WITH DIFFERENTIAL/PLATELET
Eosinophils Relative: 4.1 % (ref 0.0–5.0)
HCT: 42.9 % (ref 36.0–46.0)
Lymphocytes Relative: 21.3 % (ref 12.0–46.0)
Lymphs Abs: 1.6 10*3/uL (ref 0.7–4.0)
Monocytes Relative: 6.6 % (ref 3.0–12.0)
Neutrophils Relative %: 67.5 % (ref 43.0–77.0)
Platelets: 270 10*3/uL (ref 150.0–400.0)
WBC: 7.5 10*3/uL (ref 4.5–10.5)

## 2012-01-01 LAB — HEMOGLOBIN A1C: Hgb A1c MFr Bld: 5.9 % (ref 4.6–6.5)

## 2012-01-06 ENCOUNTER — Other Ambulatory Visit: Payer: Self-pay | Admitting: *Deleted

## 2012-01-08 ENCOUNTER — Telehealth: Payer: Self-pay | Admitting: Family Medicine

## 2012-01-08 ENCOUNTER — Encounter: Payer: Self-pay | Admitting: Family Medicine

## 2012-01-08 ENCOUNTER — Ambulatory Visit (INDEPENDENT_AMBULATORY_CARE_PROVIDER_SITE_OTHER): Payer: Medicare Other | Admitting: Family Medicine

## 2012-01-08 VITALS — BP 124/80 | HR 88 | Temp 98.0°F | Ht 60.25 in | Wt 175.0 lb

## 2012-01-08 DIAGNOSIS — E0590 Thyrotoxicosis, unspecified without thyrotoxic crisis or storm: Secondary | ICD-10-CM

## 2012-01-08 DIAGNOSIS — R7309 Other abnormal glucose: Secondary | ICD-10-CM

## 2012-01-08 DIAGNOSIS — M949 Disorder of cartilage, unspecified: Secondary | ICD-10-CM

## 2012-01-08 DIAGNOSIS — M899 Disorder of bone, unspecified: Secondary | ICD-10-CM

## 2012-01-08 DIAGNOSIS — Z1231 Encounter for screening mammogram for malignant neoplasm of breast: Secondary | ICD-10-CM

## 2012-01-08 DIAGNOSIS — I10 Essential (primary) hypertension: Secondary | ICD-10-CM

## 2012-01-08 DIAGNOSIS — E669 Obesity, unspecified: Secondary | ICD-10-CM

## 2012-01-08 MED ORDER — PAROXETINE HCL 10 MG PO TABS: 10.0000 mg | ORAL_TABLET | Freq: Every evening | ORAL | Status: DC

## 2012-01-08 MED ORDER — FOSINOPRIL SODIUM 20 MG PO TABS: 20.0000 mg | ORAL_TABLET | Freq: Every day | ORAL | Status: DC

## 2012-01-08 MED ORDER — NISOLDIPINE ER 34 MG PO TB24: 34.0000 mg | ORAL_TABLET | Freq: Every day | ORAL | Status: DC

## 2012-01-08 MED ORDER — ZOSTER VACCINE LIVE 19400 UNT/0.65ML ~~LOC~~ SOLR: 0.6500 mL | Freq: Once | SUBCUTANEOUS | Status: AC

## 2012-01-08 NOTE — Progress Notes (Signed)
Subjective:    Patient ID: Kerry Simpson, female    DOB: June 10, 1946, 66 y.o.   MRN: [id number]  HPI Here for check up of chronic medical conditions and to review health mt list    Wt is up 4lb  bmi is over 30 Does not have a plan for wt loss  Will loose in summer  Not motivated    bp is  124/80   Today No cp or palpitations or headaches or edema  No side effects to medicines   Pays for 90 d supply -- upper tier  Will call insurance and see what is affordible  Hypothyroid Lab Results  Component Value Date   TSH 1.18 01/01/2012  clinically-- feels about the same No change in her skin/ hair or energy level   Osteopenia  Last dexa--- more than 2 years ago  Ca and d- is good about taking that tums and vit D pills  Will schedule dexa  Not a lot of exercise- chases a grandchild  Will start walking   Hyperglycemia Lab Results  Component Value Date   HGBA1C 5.9 01/01/2012   overall good - not staying away from the sugar- has a sweet tooth  Knows she needs to loose weight   Due for tetnus shot--has been within 10 years- she will call with the date   Zoster status-- needs px for pharmacy - will cover some - she can pay the 95$ Sent to [facility]   mammo 12/11-needs to schedule that -- at breast  Self exam - no lumps   colonosc 8/05 Recall - was 10 years   Pap 11/11 -normal  No problems  No abn paps No new partners   Lab Results  Component Value Date   CHOL 137 01/01/2012   CHOL 169 09/25/2010   CHOL 177 06/21/2009   Lab Results  Component Value Date   HDL 42.70 01/01/2012   HDL 44.01 09/25/2010   HDL 43.90 06/21/2009   Lab Results  Component Value Date   LDLCALC 69 01/01/2012   LDLCALC 106* 09/25/2010   LDLCALC 115* 06/21/2009   Lab Results  Component Value Date   TRIG 126.0 01/01/2012   TRIG 77.0 09/25/2010   TRIG 90.0 06/21/2009   Lab Results  Component Value Date   CHOLHDL 3 01/01/2012   CHOLHDL 4 09/25/2010   CHOLHDL 4 06/21/2009   No results found  for this basename: LDLDIRECT   came down this year   Patient Active Problem List  Diagnoses  . HYPERTHYROIDISM  . OBESITY  . HYPERTENSION, ESSENTIAL NOS  . OSTEOPENIA  . HYPERGLYCEMIA  . STRESS REACTION, ACUTE, WITH EMOTIONAL DISTURBANCE  . Other screening mammogram   Past Medical History  Diagnosis Date  . Grave's disease   . Anemia     iron def  . Obesity   . Hypertension   . Fibrocystic breast   . Osteoporosis     osteopenia   Past Surgical History  Procedure Date  . Fracture surgery     shoulder  . Cervical stenosis    History  Substance Use Topics  . Smoking status: Never Smoker   . Smokeless tobacco: Not on file  . Alcohol Use: No   Family History  Problem Relation Age of Onset  . Hypertension Mother   . Diabetes Father   . Hypertension Father   . Hypertension Brother   . Cancer Brother     prostate  . Heart disease Brother     with  bypass   No Known Allergies Current Outpatient Prescriptions on File Prior to Visit  Medication Sig Dispense Refill  . calcium carbonate (TUMS) 500 MG chewable tablet Chew 2 tablets by mouth daily.        . cholecalciferol (VITAMIN D) 1000 UNITS tablet Take 1,000 Units by mouth daily.        . fish oil-omega-3 fatty acids 1000 MG capsule Three capsules by mouth daily       . naproxen sodium (ANAPROX) 220 MG tablet Take 220 mg by mouth 2 (two) times daily with a meal.        . vitamin B-12 (CYANOCOBALAMIN) 1000 MCG tablet Take 1,000 mcg by mouth daily.             Review of Systems Review of Systems  Constitutional: Negative for fever, appetite change, fatigue and unexpected weight change.  Eyes: Negative for pain and visual disturbance.  Respiratory: Negative for cough and shortness of breath.   Cardiovascular: Negative for cp or palpitations    Gastrointestinal: Negative for nausea, diarrhea and constipation.  Genitourinary: Negative for urgency and frequency.  Skin: Negative for pallor or rash   Neurological:  Negative for weakness, light-headedness, numbness and headaches.  Hematological: Negative for adenopathy. Does not bruise/bleed easily.  Psychiatric/Behavioral: Negative for dysphoric mood. The patient is not nervous/anxious.          Objective:   Physical Exam  Constitutional: She appears well-developed and well-nourished. No distress.       Obese and well appearing   HENT:  Head: Normocephalic and atraumatic.  Right Ear: External ear normal.  Left Ear: External ear normal.  Mouth/Throat: Oropharynx is clear and moist.  Eyes: Conjunctivae and EOM are normal. Pupils are equal, round, and reactive to light. No scleral icterus.  Neck: Normal range of motion. Neck supple. No JVD present. Carotid bruit is not present. No thyromegaly present.  Cardiovascular: Normal rate, regular rhythm, normal heart sounds and intact distal pulses.  Exam reveals no gallop.   Pulmonary/Chest: Effort normal and breath sounds normal. No respiratory distress. She has no wheezes.  Abdominal: Soft. Bowel sounds are normal. She exhibits no distension, no abdominal bruit and no mass. There is no tenderness.  Genitourinary: No breast swelling, tenderness, discharge or bleeding.       Breast exam: No mass, nodules, thickening, tenderness, bulging, retraction, inflamation, nipple discharge or skin changes noted.  No axillary or clavicular LA.  Chaperoned exam.    Musculoskeletal: Normal range of motion. She exhibits no edema and no tenderness.  Lymphadenopathy:    She has no cervical adenopathy.  Neurological: She is alert. She has normal reflexes. No cranial nerve deficit. She exhibits normal muscle tone. Coordination normal.  Skin: Skin is warm and dry. No rash noted. No erythema. No pallor.  Psychiatric: She has a normal mood and affect.          Assessment & Plan:

## 2012-01-08 NOTE — Assessment & Plan Note (Signed)
Overdue dexa  Disc ca and D and exercise Enc to start walking

## 2012-01-08 NOTE — Assessment & Plan Note (Signed)
tsh stable and tx  No symptoms

## 2012-01-08 NOTE — Assessment & Plan Note (Signed)
bp in fair control at this time  No changes needed  Disc lifstyle change with low sodium diet and exercise   Rev labs 

## 2012-01-08 NOTE — Assessment & Plan Note (Signed)
Stable with a1c of 5.9 Adv to work on wt loss and low glycemic diet and exercise

## 2012-01-08 NOTE — Assessment & Plan Note (Signed)
Scheduled annual screening mammogram Nl breast exam today  Encouraged monthly self exams   

## 2012-01-08 NOTE — Telephone Encounter (Signed)
Great - she will be due in 2014 then, thanks

## 2012-01-08 NOTE — Telephone Encounter (Signed)
Patient notified as instructed by telephone. 

## 2012-01-08 NOTE — Telephone Encounter (Signed)
Patient called to let you know her last tetanus shot was 07/21/03.

## 2012-01-08 NOTE — Patient Instructions (Addendum)
We will refer you for dexa and mammogram at check out  Call us with the date of your last tetnus shot  I sent a px for shingles vaccine to [facility]  Think about working on weight loss

## 2012-01-08 NOTE — Assessment & Plan Note (Signed)
Discussed how this problem influences overall health and the risks it imposes  Reviewed plan for weight loss with lower calorie diet (via better food choices and also portion control or program like weight watchers) and exercise building up to or more than 30 minutes 5 days per week including some aerobic activity   Pt just not motivated or willing to make wt loss a priority yet- she is honest about that

## 2012-01-15 ENCOUNTER — Ambulatory Visit: Payer: Medicare Other

## 2012-01-17 ENCOUNTER — Ambulatory Visit
Admission: RE | Admit: 2012-01-17 | Discharge: 2012-01-17 | Disposition: A | Discharge status: Home / Self Care | Payer: Medicare Other | Source: Ambulatory Visit | Attending: Family Medicine | Admitting: Family Medicine

## 2012-01-17 DIAGNOSIS — Z1231 Encounter for screening mammogram for malignant neoplasm of breast: Secondary | ICD-10-CM

## 2012-01-20 ENCOUNTER — Encounter: Payer: Self-pay | Admitting: *Deleted

## 2012-11-23 ENCOUNTER — Ambulatory Visit (INDEPENDENT_AMBULATORY_CARE_PROVIDER_SITE_OTHER): Payer: Medicare Other | Admitting: Family Medicine

## 2012-11-23 ENCOUNTER — Encounter: Payer: Self-pay | Admitting: Family Medicine

## 2012-11-23 VITALS — BP 146/74 | HR 92 | Temp 98.4°F | Ht 60.25 in | Wt 184.2 lb

## 2012-11-23 DIAGNOSIS — J209 Acute bronchitis, unspecified: Secondary | ICD-10-CM

## 2012-11-23 MED ORDER — GUAIFENESIN-CODEINE 100-10 MG/5ML PO SYRP: 5.0000 mL | ORAL_SOLUTION | Freq: Four times a day (QID) | ORAL | Status: DC | PRN

## 2012-11-23 MED ORDER — AZITHROMYCIN 250 MG PO TABS: ORAL_TABLET | ORAL | Status: DC

## 2012-11-23 MED ORDER — PREDNISONE 10 MG PO TABS: ORAL_TABLET | ORAL | Status: DC

## 2012-11-23 NOTE — Patient Instructions (Addendum)
You have bronchitis with some reactive airways (wheezing)  Drink fluids and get some rest  Take zpak and prednisone as directed Prednisone will make you hungry and thirsty and hyper- avoid sweets/ sugar while on it because it raises blood sugar Take the codeine cough syrup with caution Update if not starting to improve in a week or if worsening  - especially if wheezing worsens

## 2012-11-23 NOTE — Progress Notes (Signed)
Subjective:    Patient ID: Kerry Simpson, female    DOB: 1946-02-05, 67 y.o.   MRN: [id number]  HPI Here with c/o of a chronic cough   Has HTN and is on ace inhibitor  She had a uri both before and after xmas (was ok until then)  Is worse at night  Cough is overall dry / occ productive and mucous is thick white in color  If any fever/ low grade -no aches or chills  No post nasal drip / not clearing her throat a lot  No sinus pain   occ a little wheeze/ but not sob occ hears a rattle   She has taken Vics (day and night cold and flu) and delsym They help a little   Patient Active Problem List  Diagnosis  . HYPERTHYROIDISM  . OBESITY  . HYPERTENSION, ESSENTIAL NOS  . OSTEOPENIA  . HYPERGLYCEMIA  . STRESS REACTION, ACUTE, WITH EMOTIONAL DISTURBANCE  . Other screening mammogram   Past Medical History  Diagnosis Date  . Grave's disease   . Anemia     iron def  . Obesity   . Hypertension   . Fibrocystic breast   . Osteoporosis     osteopenia   Past Surgical History  Procedure Date  . Fracture surgery     shoulder  . Cervical stenosis    History  Substance Use Topics  . Smoking status: Never Smoker   . Smokeless tobacco: Not on file  . Alcohol Use: No   Family History  Problem Relation Age of Onset  . Hypertension Mother   . Diabetes Father   . Hypertension Father   . Hypertension Brother   . Cancer Brother     prostate  . Heart disease Brother     with bypass   No Known Allergies Current Outpatient Prescriptions on File Prior to Visit  Medication Sig Dispense Refill  . calcium carbonate (TUMS) 500 MG chewable tablet Chew 2 tablets by mouth daily.        . fish oil-omega-3 fatty acids 1000 MG capsule Three capsules by mouth daily       . fosinopril (MONOPRIL) 20 MG tablet Take 1 tablet (20 mg total) by mouth daily.  90 tablet  3  . naproxen sodium (ANAPROX) 220 MG tablet Take 220 mg by mouth 2 (two) times daily with a meal.       . nisoldipine (SULAR)  34 MG 24 hr tablet Take 1 tablet (34 mg total) by mouth daily.  90 tablet  3  . PARoxetine (PAXIL) 10 MG tablet Take 1 tablet (10 mg total) by mouth every evening.  90 tablet  3  . cholecalciferol (VITAMIN D) 1000 UNITS tablet Take 1,000 Units by mouth daily.             Review of Systems Review of Systems  Constitutional: Negative for , appetite change,  and unexpected weight change.  ENT pos for drainage and neg for sinus pain  Eyes: Negative for pain and visual disturbance.  Respiratory: Negative for shortness of breath.   Cardiovascular: Negative for cp or palpitations    Gastrointestinal: Negative for nausea, diarrhea and constipation.  Genitourinary: Negative for urgency and frequency.  Skin: Negative for pallor or rash   Neurological: Negative for weakness, light-headedness, numbness and headaches.  Hematological: Negative for adenopathy. Does not bruise/bleed easily.  Psychiatric/Behavioral: Negative for dysphoric mood. The patient is not nervous/anxious.         Objective:  Physical Exam  Constitutional: She appears well-developed and well-nourished. No distress.  HENT:  Head: Normocephalic and atraumatic.  Right Ear: External ear normal.  Left Ear: External ear normal.  Mouth/Throat: Oropharynx is clear and moist. No oropharyngeal exudate.       Nares are injected and congested  No sinus tenderness  Eyes: Conjunctivae normal and EOM are normal. Pupils are equal, round, and reactive to light. Right eye exhibits no discharge. Left eye exhibits no discharge.  Neck: Normal range of motion. Neck supple.  Cardiovascular: Normal rate and regular rhythm.   Pulmonary/Chest: Effort normal. No respiratory distress. She has wheezes. She has no rales.       Harsh bs Wheeze on forced exp  Diffuse rhonchi Loud cough  Lymphadenopathy:    She has no cervical adenopathy.  Neurological: She is alert.  Skin: Skin is warm and dry. No rash noted.  Psychiatric: She has a normal mood  and affect.          Assessment & Plan:

## 2012-11-23 NOTE — Assessment & Plan Note (Signed)
Will tx with zpak (given length of illness) and also prednisone 30 mg taper (disc side eff) Disc symptomatic care - see instructions on AVS  Will try robitussin ac for cough with caution  Update if not starting to improve in a week or if worsening  - esp if worse wheezing

## 2012-12-04 ENCOUNTER — Telehealth: Payer: Self-pay

## 2012-12-04 NOTE — Telephone Encounter (Signed)
Patient advised.

## 2012-12-04 NOTE — Telephone Encounter (Signed)
Pt seen 11/23/12 pt has taken medicine,wheezing is gone but still slight cough; main issue is pt is extremely weak and wants to know if needs to have lab test done.Please advise.

## 2012-12-04 NOTE — Telephone Encounter (Signed)
Please have her follow up next week if not feeling better - she could be experiencing a post viral/ post illness fatigue syndrome, but I want to make sure she does not have pneumonia or other new issue - and we will do labs if warranted

## 2012-12-07 ENCOUNTER — Ambulatory Visit (INDEPENDENT_AMBULATORY_CARE_PROVIDER_SITE_OTHER)
Admission: RE | Admit: 2012-12-07 | Discharge: 2012-12-07 | Disposition: A | Discharge status: Home / Self Care | Payer: Medicare Other | Source: Ambulatory Visit | Attending: Family Medicine | Admitting: Family Medicine

## 2012-12-07 ENCOUNTER — Ambulatory Visit (INDEPENDENT_AMBULATORY_CARE_PROVIDER_SITE_OTHER): Payer: Medicare Other | Admitting: Family Medicine

## 2012-12-07 ENCOUNTER — Encounter: Payer: Self-pay | Admitting: Family Medicine

## 2012-12-07 VITALS — BP 156/86 | HR 91 | Temp 98.4°F | Ht 60.25 in | Wt 184.8 lb

## 2012-12-07 DIAGNOSIS — J209 Acute bronchitis, unspecified: Secondary | ICD-10-CM

## 2012-12-07 DIAGNOSIS — R071 Chest pain on breathing: Secondary | ICD-10-CM

## 2012-12-07 DIAGNOSIS — R0789 Other chest pain: Secondary | ICD-10-CM

## 2012-12-07 NOTE — Assessment & Plan Note (Signed)
Overall much improved after zpak and prednisone with reassuring exam- but pt is worried by excessive fatigue and chest wall soreness cxr and update  Adv to use warm compress on R ribs as needed  Adv if acutely sob to go to ER

## 2012-12-07 NOTE — Patient Instructions (Addendum)
I think you are recovering slowly but well  For chest wall pain and fatigue - we will do a chest xray on the way out and update you with results  Use a warm compress on chest/ ribs when needed If short of breath or other symptoms/ especially if severe call and go to the ER

## 2012-12-07 NOTE — Assessment & Plan Note (Signed)
Suspect strain/ costochondritis from recent bronchitis and cough cxr today

## 2012-12-07 NOTE — Progress Notes (Signed)
Subjective:    Patient ID: Kerry Simpson, female    DOB: Jan 30, 1946, 67 y.o.   MRN: [id number]  HPI Pt here for f/u of bronchitis - with weakness  Was seen 1/13 Given zpak/prednisone/ robitussin ac for bronchitis with bronchospasm Called fri saying she was weak  Wheezing is much much better  Cough is almost gone but not quite - mucous is thick and white  Is really really tired -moreso than she thinks she should be   Last night - sharp pain in R lower ribs sitting in recliner last night  Took husband's hydrocodone 10-325mg  - and it helped some  More uncomfortable lying down- had to sit up  Not as bad today- just a twinge   Patient Active Problem List  Diagnosis  . HYPERTHYROIDISM  . OBESITY  . HYPERTENSION, ESSENTIAL NOS  . OSTEOPENIA  . HYPERGLYCEMIA  . STRESS REACTION, ACUTE, WITH EMOTIONAL DISTURBANCE  . Other screening mammogram  . Bronchitis with bronchospasm   Past Medical History  Diagnosis Date  . Grave's disease   . Anemia     iron def  . Obesity   . Hypertension   . Fibrocystic breast   . Osteoporosis     osteopenia   Past Surgical History  Procedure Date  . Fracture surgery     shoulder  . Cervical stenosis    History  Substance Use Topics  . Smoking status: Never Smoker   . Smokeless tobacco: Not on file  . Alcohol Use: No   Family History  Problem Relation Age of Onset  . Hypertension Mother   . Diabetes Father   . Hypertension Father   . Hypertension Brother   . Cancer Brother     prostate  . Heart disease Brother     with bypass   No Known Allergies Current Outpatient Prescriptions on File Prior to Visit  Medication Sig Dispense Refill  . aspirin 325 MG EC tablet Take 325 mg by mouth daily.      . calcium carbonate (TUMS) 500 MG chewable tablet Chew 2 tablets by mouth daily.        . cholecalciferol (VITAMIN D) 1000 UNITS tablet Take 1,000 Units by mouth daily.        . fish oil-omega-3 fatty acids 1000 MG capsule Three capsules by  mouth daily       . fosinopril (MONOPRIL) 20 MG tablet Take 1 tablet (20 mg total) by mouth daily.  90 tablet  3  . naproxen sodium (ANAPROX) 220 MG tablet Take 220 mg by mouth 2 (two) times daily with a meal.       . nisoldipine (SULAR) 34 MG 24 hr tablet Take 1 tablet (34 mg total) by mouth daily.  90 tablet  3  . PARoxetine (PAXIL) 10 MG tablet Take 1 tablet (10 mg total) by mouth every evening.  90 tablet  3          Review of Systems Review of Systems  Constitutional: Negative for fever, appetite change,  and unexpected weight change. pos for fatigue  Eyes: Negative for pain and visual disturbance.  ENT pos for congestion and post nasal drip  Respiratory: Negative for shortness of breath. Pos for chest wall pain   Cardiovascular: Negative for  palpitations    Gastrointestinal: Negative for nausea, diarrhea and constipation.  Genitourinary: Negative for urgency and frequency.  Skin: Negative for pallor or rash   Neurological: Negative for weakness, light-headedness, numbness and headaches.  Hematological: Negative for  adenopathy. Does not bruise/bleed easily.  Psychiatric/Behavioral: Negative for dysphoric mood. The patient is not nervous/anxious.         Objective:   Physical Exam  Constitutional: She appears well-developed and well-nourished. No distress.       obese and well appearing   HENT:  Head: Normocephalic and atraumatic.  Right Ear: External ear normal.  Left Ear: External ear normal.  Mouth/Throat: Oropharynx is clear and moist. No oropharyngeal exudate.       Nares are boggy but clear No sinus tenderness  Eyes: Conjunctivae normal and EOM are normal. Pupils are equal, round, and reactive to light. Right eye exhibits no discharge. Left eye exhibits no discharge. No scleral icterus.  Neck: Normal range of motion. Neck supple. No JVD present. No thyromegaly present.  Cardiovascular: Normal rate, regular rhythm and normal heart sounds.   Pulmonary/Chest: Effort  normal and breath sounds normal. No respiratory distress. She has no wheezes. She has no rales. She exhibits tenderness.       No crackles or rhonchi- good air exch Mild R anterior rib tenderness  Musculoskeletal: She exhibits no tenderness.  Lymphadenopathy:    She has no cervical adenopathy.  Neurological: She is alert. She has normal reflexes. No cranial nerve deficit. She exhibits normal muscle tone. Coordination normal.  Skin: Skin is warm and dry. No rash noted. No erythema. No pallor.  Psychiatric: She has a normal mood and affect.          Assessment & Plan:

## 2013-01-14 ENCOUNTER — Other Ambulatory Visit: Payer: Self-pay | Admitting: *Deleted

## 2013-01-14 MED ORDER — PAROXETINE HCL 10 MG PO TABS: 10.0000 mg | ORAL_TABLET | Freq: Every evening | ORAL | Status: DC

## 2013-02-11 ENCOUNTER — Ambulatory Visit
Admission: RE | Admit: 2013-02-11 | Discharge: 2013-02-11 | Disposition: A | Discharge status: Home / Self Care | Payer: Medicare Other | Source: Ambulatory Visit | Attending: Family Medicine | Admitting: Family Medicine

## 2013-02-11 DIAGNOSIS — Z1231 Encounter for screening mammogram for malignant neoplasm of breast: Secondary | ICD-10-CM

## 2013-02-12 ENCOUNTER — Encounter: Payer: Self-pay | Admitting: *Deleted

## 2013-02-24 ENCOUNTER — Other Ambulatory Visit: Payer: Self-pay | Admitting: *Deleted

## 2013-02-24 MED ORDER — FOSINOPRIL SODIUM 20 MG PO TABS: 20.0000 mg | ORAL_TABLET | Freq: Every day | ORAL | Status: DC

## 2013-03-02 ENCOUNTER — Other Ambulatory Visit: Payer: Self-pay | Admitting: Family Medicine

## 2013-03-02 MED ORDER — NISOLDIPINE ER 34 MG PO TB24: 34.0000 mg | ORAL_TABLET | Freq: Every day | ORAL | Status: DC

## 2013-03-30 ENCOUNTER — Telehealth: Payer: Self-pay | Admitting: Family Medicine

## 2013-03-30 DIAGNOSIS — R7309 Other abnormal glucose: Secondary | ICD-10-CM

## 2013-03-30 DIAGNOSIS — Z0000 Encounter for general adult medical examination without abnormal findings: Secondary | ICD-10-CM

## 2013-03-30 DIAGNOSIS — E0590 Thyrotoxicosis, unspecified without thyrotoxic crisis or storm: Secondary | ICD-10-CM

## 2013-03-30 DIAGNOSIS — M899 Disorder of bone, unspecified: Secondary | ICD-10-CM

## 2013-03-30 DIAGNOSIS — I10 Essential (primary) hypertension: Secondary | ICD-10-CM

## 2013-03-30 NOTE — Telephone Encounter (Signed)
Message copied by Judy Pimple on Tue Mar 30, 2013 10:22 PM ------      Message from: Alvina Chou      Created: Thu Mar 18, 2013  3:01 PM      Regarding: lab orders for Wednesday, 5.21.14       Patient is scheduled for CPX labs, please order future labs, Thanks , Terri       ------

## 2013-03-31 ENCOUNTER — Other Ambulatory Visit (INDEPENDENT_AMBULATORY_CARE_PROVIDER_SITE_OTHER): Payer: Medicare Other

## 2013-03-31 DIAGNOSIS — E0590 Thyrotoxicosis, unspecified without thyrotoxic crisis or storm: Secondary | ICD-10-CM

## 2013-03-31 DIAGNOSIS — I10 Essential (primary) hypertension: Secondary | ICD-10-CM

## 2013-03-31 DIAGNOSIS — M899 Disorder of bone, unspecified: Secondary | ICD-10-CM

## 2013-03-31 DIAGNOSIS — R7309 Other abnormal glucose: Secondary | ICD-10-CM

## 2013-03-31 DIAGNOSIS — Z0000 Encounter for general adult medical examination without abnormal findings: Secondary | ICD-10-CM

## 2013-03-31 LAB — CBC WITH DIFFERENTIAL/PLATELET
Basophils Absolute: 0.1 10*3/uL (ref 0.0–0.1)
Hemoglobin: 15.3 g/dL — ABNORMAL HIGH (ref 12.0–15.0)
Lymphocytes Relative: 33.5 % (ref 12.0–46.0)
Monocytes Relative: 8.6 % (ref 3.0–12.0)
Neutro Abs: 2.9 10*3/uL (ref 1.4–7.7)
Neutrophils Relative %: 51.3 % (ref 43.0–77.0)
RBC: 5.12 Mil/uL — ABNORMAL HIGH (ref 3.87–5.11)
RDW: 13.3 % (ref 11.5–14.6)

## 2013-03-31 LAB — COMPREHENSIVE METABOLIC PANEL
ALT: 21 U/L (ref 0–35)
AST: 21 U/L (ref 0–37)
Albumin: 3.9 g/dL (ref 3.5–5.2)
CO2: 29 mEq/L (ref 19–32)
Calcium: 9.1 mg/dL (ref 8.4–10.5)
Chloride: 107 mEq/L (ref 96–112)
Creatinine, Ser: 0.9 mg/dL (ref 0.4–1.2)
GFR: 70.76 mL/min (ref >60.00)
Potassium: 4.4 mEq/L (ref 3.5–5.1)
Total Protein: 7 g/dL (ref 6.0–8.3)

## 2013-03-31 LAB — LIPID PANEL
LDL Cholesterol: 90 mg/dL (ref 0–99)
Total CHOL/HDL Ratio: 3
Triglycerides: 61 mg/dL (ref 0.0–149.0)

## 2013-03-31 LAB — TSH: TSH: 1.67 u[IU]/mL (ref 0.35–5.50)

## 2013-04-07 ENCOUNTER — Encounter: Payer: Medicare Other | Admitting: Family Medicine

## 2013-04-12 ENCOUNTER — Other Ambulatory Visit: Payer: Self-pay | Admitting: *Deleted

## 2013-04-12 MED ORDER — PAROXETINE HCL 10 MG PO TABS: 10.0000 mg | ORAL_TABLET | Freq: Every evening | ORAL | Status: DC

## 2013-04-14 ENCOUNTER — Encounter: Payer: Self-pay | Admitting: Family Medicine

## 2013-04-14 ENCOUNTER — Other Ambulatory Visit (HOSPITAL_COMMUNITY)
Admission: RE | Admit: 2013-04-14 | Discharge: 2013-04-14 | Disposition: A | Discharge status: Home / Self Care | Payer: Medicare Other | Source: Ambulatory Visit | Attending: Family Medicine | Admitting: Family Medicine

## 2013-04-14 ENCOUNTER — Ambulatory Visit (INDEPENDENT_AMBULATORY_CARE_PROVIDER_SITE_OTHER): Payer: Medicare Other | Admitting: Family Medicine

## 2013-04-14 VITALS — BP 142/80 | HR 69 | Temp 98.6°F | Ht 60.25 in | Wt 180.5 lb

## 2013-04-14 DIAGNOSIS — Z0000 Encounter for general adult medical examination without abnormal findings: Secondary | ICD-10-CM

## 2013-04-14 DIAGNOSIS — R7309 Other abnormal glucose: Secondary | ICD-10-CM

## 2013-04-14 DIAGNOSIS — Z124 Encounter for screening for malignant neoplasm of cervix: Secondary | ICD-10-CM | POA: Insufficient documentation

## 2013-04-14 DIAGNOSIS — Z01419 Encounter for gynecological examination (general) (routine) without abnormal findings: Secondary | ICD-10-CM

## 2013-04-14 DIAGNOSIS — I10 Essential (primary) hypertension: Secondary | ICD-10-CM

## 2013-04-14 DIAGNOSIS — E0590 Thyrotoxicosis, unspecified without thyrotoxic crisis or storm: Secondary | ICD-10-CM

## 2013-04-14 DIAGNOSIS — M899 Disorder of bone, unspecified: Secondary | ICD-10-CM

## 2013-04-14 DIAGNOSIS — M949 Disorder of cartilage, unspecified: Secondary | ICD-10-CM

## 2013-04-14 DIAGNOSIS — E669 Obesity, unspecified: Secondary | ICD-10-CM

## 2013-04-14 NOTE — Progress Notes (Signed)
Subjective:    Patient ID: Kerry Simpson, female    DOB: 1946-03-20, 67 y.o.   MRN: [id number]  HPI I have personally reviewed the Medicare Annual Wellness questionnaire and have noted 1. The patient's medical and social history 2. Their use of alcohol, tobacco or illicit drugs 3. Their current medications and supplements 4. The patient's functional ability including ADL's, fall risks, home safety risks and hearing or visual             impairment. 5. Diet and physical activities 6. Evidence for depression or mood disorders  No new medicine problems - feels good   The patients weight, height, BMI have been recorded in the chart and visual acuity is per eye clinic.  I have made referrals, counseling and provided education to the patient based review of the above and I have provided the pt with a written personalized care plan for preventive services.  See scanned forms.  Routine anticipatory guidance given to patient.  See health maintenance. Flu 10/13  Shingles vaccine was 2/13 PNA was 11/11- complete  Tetanus 9/04  Colon 2005 - will be due in a year  Breast cancer screening- mammogram in 4/14  Self exam- no lumps at all  Pap 11/11 normal - no gyn problems or symptoms  Advance directive--has that set up with living will  Cognitive function addressed- see scanned forms- and if abnormal then additional documentation follows. No memory concerns    PMH and SH reviewed  Meds, vitals, and allergies reviewed.   ROS: See HPI.  Otherwise negative.    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   Falls- one fall / slipped on gravel in flip flops - landed on buttocks and was fine   Mood-has been very good - better now  Does well with her paxil- it works well   Hx of graves dz Lab Results  Component Value Date   TSH 1.67 03/31/2013    bp is stable today  No cp or  palpitations or headaches or edema  No side effects to medicines  BP Readings from Last 3 Encounters:  04/14/13 142/80  12/07/12 156/86  11/23/12 146/74      Lab Results  Component Value Date   CHOL 146 03/31/2013   HDL 43.70 03/31/2013   LDLCALC 90 03/31/2013   TRIG 61.0 03/31/2013   CHOLHDL 3 03/31/2013    Hyperglycemia Lab Results  Component Value Date   HGBA1C 5.8 03/31/2013    Stable overall, knows she needs to loose weight  Diet- has good knowledge but not committed to weight loss  Exercise- active      Patient Active Problem List   Diagnosis Date Noted  . Encounter for routine gynecological examination 04/14/2013  . Encounter for Medicare annual wellness exam 03/30/2013  . Right-sided chest wall pain 12/07/2012  . Other screening mammogram 01/08/2012  . STRESS REACTION, ACUTE, WITH EMOTIONAL DISTURBANCE 11/26/2010  . HYPERGLYCEMIA 09/28/2010  . OSTEOPENIA 06/21/2009  . HYPERTHYROIDISM 04/26/2008  . OBESITY 04/26/2008  . HYPERTENSION, ESSENTIAL NOS 04/26/2008   Past Medical History  Diagnosis Date  . Grave's disease   . Anemia     iron def  . Obesity   . Hypertension   . Fibrocystic breast   . Osteoporosis     osteopenia   Past Surgical History  Procedure Laterality Date  .  Fracture surgery      shoulder  . Cervical stenosis     History  Substance Use Topics  . Smoking status: Never Smoker   . Smokeless tobacco: Not on file  . Alcohol Use: No   Family History  Problem Relation Age of Onset  . Hypertension Mother   . Diabetes Father   . Hypertension Father   . Hypertension Brother   . Cancer Brother     prostate  . Heart disease Brother     with bypass   No Known Allergies Current Outpatient Prescriptions on File Prior to Visit  Medication Sig Dispense Refill  . aspirin 325 MG EC tablet Take 325 mg by mouth daily.      . calcium carbonate (TUMS) 500 MG chewable tablet Chew 2 tablets by mouth daily.        . fish oil-omega-3 fatty acids  1000 MG capsule Three capsules by mouth daily       . fosinopril (MONOPRIL) 20 MG tablet Take 1 tablet (20 mg total) by mouth daily.  90 tablet  0  . naproxen sodium (ANAPROX) 220 MG tablet Take 220 mg by mouth 2 (two) times daily with a meal.       . nisoldipine (SULAR) 34 MG 24 hr tablet Take 1 tablet (34 mg total) by mouth daily.  90 tablet  1  . PARoxetine (PAXIL) 10 MG tablet Take 1 tablet (10 mg total) by mouth every evening.  90 tablet  1   No current facility-administered medications on file prior to visit.    Review of Systems Review of Systems  Constitutional: Negative for fever, appetite change, fatigue and unexpected weight change.  Eyes: Negative for pain and visual disturbance.  Respiratory: Negative for cough and shortness of breath.   Cardiovascular: Negative for cp or palpitations    Gastrointestinal: Negative for nausea, diarrhea and constipation.  Genitourinary: Negative for urgency and frequency.  Skin: Negative for pallor or rash   Neurological: Negative for weakness, light-headedness, numbness and headaches.  Hematological: Negative for adenopathy. Does not bruise/bleed easily.  Psychiatric/Behavioral: Negative for dysphoric mood. The patient is not nervous/anxious.         Objective:   Physical Exam  Constitutional: She appears well-developed and well-nourished. No distress.  obese and well appearing   HENT:  Head: Normocephalic and atraumatic.  Right Ear: External ear normal.  Left Ear: External ear normal.  Nose: Nose normal.  Mouth/Throat: Oropharynx is clear and moist. No oropharyngeal exudate.  Eyes: Conjunctivae and EOM are normal. Pupils are equal, round, and reactive to light. Right eye exhibits no discharge. Left eye exhibits no discharge. No scleral icterus.  Neck: Normal range of motion. Neck supple. No JVD present. Carotid bruit is not present. No thyromegaly present.  Cardiovascular: Normal rate, regular rhythm, normal heart sounds and intact  distal pulses.  Exam reveals no gallop.   Pulmonary/Chest: Effort normal and breath sounds normal. No respiratory distress. She has no wheezes. She exhibits no tenderness.  Abdominal: Soft. Bowel sounds are normal. She exhibits no distension, no abdominal bruit and no mass. There is no tenderness.  Genitourinary: Vagina normal and uterus normal. No vaginal discharge found.  o External genitalia nl appearing  o Urethral meatus nl appearing  o Urethra  Nl appearing/nt and nl mobility o Bladder nt and nl by palpation o Vagina nl appearing-no bleeding or d/c o Cervix  Nl appearing/ posterior /no lesions o Uterus  Not enl/ tender or fixed  o  Adnexa/parametria nl by palp o Anus and perineum nl app and nl tone  Breast exam: No mass, nodules, thickening, tenderness, bulging, retraction, inflamation, nipple discharge or skin changes noted.  No axillary or clavicular LA.  Chaperoned exam.    Musculoskeletal: Normal range of motion. She exhibits no edema and no tenderness.  Lymphadenopathy:    She has no cervical adenopathy.  Neurological: She is alert. She has normal reflexes. No cranial nerve deficit. She exhibits normal muscle tone. Coordination normal.  Skin: Skin is warm and dry. No rash noted. No erythema. No pallor.  Psychiatric: She has a normal mood and affect.          Assessment & Plan:

## 2013-04-14 NOTE — Assessment & Plan Note (Signed)
3 year exam and pap done Pt is doing well -no complaints

## 2013-04-14 NOTE — Assessment & Plan Note (Signed)
tsh tx and stable  Clinically no change Remote hx of graves dz

## 2013-04-14 NOTE — Assessment & Plan Note (Signed)
bp in fair control at this time  No changes needed  Disc lifstyle change with low sodium diet and exercise   Rev labs 

## 2013-04-14 NOTE — Assessment & Plan Note (Signed)
Discussed how this problem influences overall health and the risks it imposes  Reviewed plan for weight loss with lower calorie diet (via better food choices and also portion control or program like weight watchers) and exercise building up to or more than 30 minutes 5 days per week including some aerobic activity    

## 2013-04-14 NOTE — Patient Instructions (Addendum)
Call the health department to see about getting a tetanus shot  Gyn exam and pap today  We will schedule bone density testing when you check out  Keep working on weight loss and take care of yourself

## 2013-04-14 NOTE — Assessment & Plan Note (Signed)
Scheduled dexa D level is tx  Enc ca and D and exercise Fall risk prev discussed Pt has hx of hyperthyroidism and 1 fall this year

## 2013-04-14 NOTE — Assessment & Plan Note (Signed)
Stable A1c Enc wt loss to prevent DM

## 2013-04-14 NOTE — Assessment & Plan Note (Signed)
Reviewed health habits including diet and exercise and skin cancer prevention Also reviewed health mt list, fam hx and immunizations  See HPI Wellness labs rev

## 2013-04-20 ENCOUNTER — Encounter: Payer: Self-pay | Admitting: Family Medicine

## 2013-04-20 ENCOUNTER — Ambulatory Visit: Payer: Self-pay | Admitting: Family Medicine

## 2013-04-20 ENCOUNTER — Encounter: Payer: Self-pay | Admitting: *Deleted

## 2013-04-23 ENCOUNTER — Encounter: Payer: Self-pay | Admitting: *Deleted

## 2013-04-23 ENCOUNTER — Encounter: Payer: Self-pay | Admitting: Family Medicine

## 2013-06-04 ENCOUNTER — Other Ambulatory Visit: Payer: Self-pay | Admitting: *Deleted

## 2013-06-04 MED ORDER — FOSINOPRIL SODIUM 20 MG PO TABS: 20.0000 mg | ORAL_TABLET | Freq: Every day | ORAL | Status: DC

## 2013-08-24 ENCOUNTER — Ambulatory Visit: Payer: Medicare Other | Admitting: Family Medicine

## 2013-09-06 ENCOUNTER — Other Ambulatory Visit: Payer: Self-pay | Admitting: Family Medicine

## 2013-09-06 MED ORDER — NISOLDIPINE ER 34 MG PO TB24: 34.0000 mg | ORAL_TABLET | Freq: Every day | ORAL | Status: DC

## 2013-09-07 ENCOUNTER — Ambulatory Visit (INDEPENDENT_AMBULATORY_CARE_PROVIDER_SITE_OTHER): Payer: Medicare Other | Admitting: Family Medicine

## 2013-09-07 ENCOUNTER — Encounter: Payer: Self-pay | Admitting: Family Medicine

## 2013-09-07 VITALS — BP 122/74 | HR 84 | Temp 98.1°F | Ht 60.25 in | Wt 182.5 lb

## 2013-09-07 DIAGNOSIS — T148XXA Other injury of unspecified body region, initial encounter: Secondary | ICD-10-CM

## 2013-09-07 NOTE — Patient Instructions (Signed)
I think you have a traumatic bruise on your abdomen Not sure what caused it  Keep an eye on it and if no improvement or new symptoms

## 2013-09-07 NOTE — Progress Notes (Signed)
Subjective:    Patient ID: Kerry Simpson, female    DOB: [birth year], 67 y.o.   MRN: [id number]  HPI Here to check a spot on her stomach- on skin It looks kind of like a bruise -comes and goes for 4 months  Does not itch or hurt   She occ lifts heavy objects and may prop them there ? Trauma ? Leaning on counters   Patient Active Problem List   Diagnosis Date Noted  . Encounter for routine gynecological examination 04/14/2013  . Encounter for Medicare annual wellness exam 03/30/2013  . Right-sided chest wall pain 12/07/2012  . Other screening mammogram 01/08/2012  . STRESS REACTION, ACUTE, WITH EMOTIONAL DISTURBANCE 11/26/2010  . HYPERGLYCEMIA 09/28/2010  . OSTEOPENIA 06/21/2009  . HYPERTHYROIDISM 04/26/2008  . OBESITY 04/26/2008  . HYPERTENSION, ESSENTIAL NOS 04/26/2008   Past Medical History  Diagnosis Date  . Grave's disease   . Anemia     iron def  . Obesity   . Hypertension   . Fibrocystic breast   . Osteoporosis     osteopenia   Past Surgical History  Procedure Laterality Date  . Fracture surgery      shoulder  . Cervical stenosis     History  Substance Use Topics  . Smoking status: Never Smoker   . Smokeless tobacco: Not on file  . Alcohol Use: No   Family History  Problem Relation Age of Onset  . Hypertension Mother   . Diabetes Father   . Hypertension Father   . Hypertension Brother   . Cancer Brother     prostate  . Heart disease Brother     with bypass   No Known Allergies Current Outpatient Prescriptions on File Prior to Visit  Medication Sig Dispense Refill  . aspirin 325 MG EC tablet Take 325 mg by mouth daily.      . calcium carbonate (TUMS) 500 MG chewable tablet Chew 2 tablets by mouth daily.        . fish oil-omega-3 fatty acids 1000 MG capsule Three capsules by mouth daily       . fosinopril (MONOPRIL) 20 MG tablet Take 1 tablet (20 mg total) by mouth daily.  90 tablet  1  . Multiple Vitamin (MULTIVITAMIN) capsule Take 1 capsule by  mouth daily.      . naproxen sodium (ANAPROX) 220 MG tablet Take 220 mg by mouth 2 (two) times daily with a meal.       . nisoldipine (SULAR) 34 MG 24 hr tablet Take 1 tablet (34 mg total) by mouth daily.  90 tablet  0  . PARoxetine (PAXIL) 10 MG tablet Take 1 tablet (10 mg total) by mouth every evening.  90 tablet  1   No current facility-administered medications on file prior to visit.    Review of Systems Review of Systems  Constitutional: Negative for fever, appetite change, fatigue and unexpected weight change.  Eyes: Negative for pain and visual disturbance.  Respiratory: Negative for cough and shortness of breath.   Cardiovascular: Negative for cp or palpitations    Gastrointestinal: Negative for nausea, diarrhea and constipation.  Genitourinary: Negative for urgency and frequency.  Skin: Negative for pallor or rash   Neurological: Negative for weakness, light-headedness, numbness and headaches.  Hematological: Negative for adenopathy. Does not bruise/bleed easily.  Psychiatric/Behavioral: Negative for dysphoric mood. The patient is not nervous/anxious.         Objective:   Physical Exam  Constitutional: She appears well-developed and well-nourished.  obese and well appearing   HENT:  Head: Normocephalic and atraumatic.  Eyes: EOM are normal. Pupils are equal, round, and reactive to light. No scleral icterus.  Neck: Normal range of motion. Neck supple.  Cardiovascular: Normal rate and regular rhythm.   Abdominal: Soft. Bowel sounds are normal. She exhibits no distension and no mass. There is no tenderness. There is no rebound and no guarding.  No HSM  Musculoskeletal: She exhibits no edema.  Lymphadenopathy:    She has no cervical adenopathy.  Neurological: She is alert.  Skin: Skin is warm and dry.  3-4 cm oval ecchymosis on L abdomen- purple in color-no swelling or deep hematoma noted  Medial is a yellow discoloration of prev bruise No tenderness  Psychiatric: She  has a normal mood and affect.          Assessment & Plan:

## 2013-09-08 NOTE — Assessment & Plan Note (Signed)
Recurrent on L abd Looks to be traumatic ? What she is doing to create pressure in this area Watch for resolution and update if more occur  Reassuring exam

## 2013-10-18 ENCOUNTER — Other Ambulatory Visit: Payer: Self-pay

## 2013-10-18 MED ORDER — PAROXETINE HCL 10 MG PO TABS: 10.0000 mg | ORAL_TABLET | Freq: Every evening | ORAL | Status: DC

## 2013-11-29 ENCOUNTER — Other Ambulatory Visit: Payer: Self-pay | Admitting: Family Medicine

## 2013-12-06 ENCOUNTER — Other Ambulatory Visit: Payer: Self-pay | Admitting: Family Medicine

## 2014-03-07 ENCOUNTER — Other Ambulatory Visit: Payer: Self-pay | Admitting: Family Medicine

## 2014-03-07 NOTE — Telephone Encounter (Signed)
Please schedule an annual exam with labs prior and refill until then thanks 

## 2014-03-07 NOTE — Telephone Encounter (Signed)
Electronic refill request, no recent/future appt., please advise  

## 2014-03-10 NOTE — Telephone Encounter (Signed)
appt scheduled and med refilled 

## 2014-04-09 ENCOUNTER — Other Ambulatory Visit: Payer: Self-pay | Admitting: Family Medicine

## 2014-04-11 ENCOUNTER — Other Ambulatory Visit: Payer: Self-pay | Admitting: *Deleted

## 2014-04-11 NOTE — Telephone Encounter (Signed)
Please refill for a month 

## 2014-04-11 NOTE — Telephone Encounter (Signed)
Electronic refill request, next OV is a CPE on 04/18/14, please advise

## 2014-04-11 NOTE — Telephone Encounter (Signed)
Done, pt gets 90 day supplies so refilled x once

## 2014-04-13 ENCOUNTER — Telehealth: Payer: Self-pay | Admitting: Family Medicine

## 2014-04-13 ENCOUNTER — Encounter: Payer: Self-pay | Admitting: Internal Medicine

## 2014-04-13 DIAGNOSIS — R7309 Other abnormal glucose: Secondary | ICD-10-CM

## 2014-04-13 DIAGNOSIS — E0590 Thyrotoxicosis, unspecified without thyrotoxic crisis or storm: Secondary | ICD-10-CM

## 2014-04-13 DIAGNOSIS — M949 Disorder of cartilage, unspecified: Secondary | ICD-10-CM

## 2014-04-13 DIAGNOSIS — M899 Disorder of bone, unspecified: Secondary | ICD-10-CM

## 2014-04-13 DIAGNOSIS — I10 Essential (primary) hypertension: Secondary | ICD-10-CM

## 2014-04-13 NOTE — Telephone Encounter (Signed)
Message copied by Abner Greenspan on Wed Apr 13, 2014 10:08 PM ------      Message from: Marchia Bond      Created: Mon Apr 11, 2014 10:15 AM      Regarding: Cpx labs 04/15/14       Please order  future cpx labs for pt's upcoming lab appt.      Thanks      Tasha       ------

## 2014-04-15 ENCOUNTER — Other Ambulatory Visit (INDEPENDENT_AMBULATORY_CARE_PROVIDER_SITE_OTHER): Payer: Medicare Other

## 2014-04-15 DIAGNOSIS — M899 Disorder of bone, unspecified: Secondary | ICD-10-CM

## 2014-04-15 DIAGNOSIS — E0590 Thyrotoxicosis, unspecified without thyrotoxic crisis or storm: Secondary | ICD-10-CM

## 2014-04-15 DIAGNOSIS — Z0000 Encounter for general adult medical examination without abnormal findings: Secondary | ICD-10-CM

## 2014-04-15 DIAGNOSIS — R7309 Other abnormal glucose: Secondary | ICD-10-CM

## 2014-04-15 DIAGNOSIS — M949 Disorder of cartilage, unspecified: Secondary | ICD-10-CM

## 2014-04-15 DIAGNOSIS — I10 Essential (primary) hypertension: Secondary | ICD-10-CM

## 2014-04-15 LAB — LIPID PANEL
CHOLESTEROL: 142 mg/dL (ref 0–200)
HDL: 41.9 mg/dL (ref >39.00)
LDL Cholesterol: 86 mg/dL (ref 0–99)
NonHDL: 100.1
Total CHOL/HDL Ratio: 3
Triglycerides: 73 mg/dL (ref 0.0–149.0)
VLDL: 14.6 mg/dL (ref 0.0–40.0)

## 2014-04-15 LAB — CBC WITH DIFFERENTIAL/PLATELET
Basophils Absolute: 0 10*3/uL (ref 0.0–0.1)
Basophils Relative: 0.6 % (ref 0.0–3.0)
EOS PCT: 4.4 % (ref 0.0–5.0)
Eosinophils Absolute: 0.3 10*3/uL (ref 0.0–0.7)
HCT: 44.2 % (ref 36.0–46.0)
Hemoglobin: 14.8 g/dL (ref 12.0–15.0)
Lymphocytes Relative: 26.6 % (ref 12.0–46.0)
Lymphs Abs: 1.7 10*3/uL (ref 0.7–4.0)
MCHC: 33.5 g/dL (ref 30.0–36.0)
MCV: 87.6 fl (ref 78.0–100.0)
MONOS PCT: 7.7 % (ref 3.0–12.0)
Monocytes Absolute: 0.5 10*3/uL (ref 0.1–1.0)
NEUTROS PCT: 60.7 % (ref 43.0–77.0)
Neutro Abs: 3.9 10*3/uL (ref 1.4–7.7)
PLATELETS: 250 10*3/uL (ref 150.0–400.0)
RBC: 5.04 Mil/uL (ref 3.87–5.11)
RDW: 13.7 % (ref 11.5–15.5)
WBC: 6.4 10*3/uL (ref 4.0–10.5)

## 2014-04-15 LAB — COMPREHENSIVE METABOLIC PANEL
ALBUMIN: 3.7 g/dL (ref 3.5–5.2)
ALK PHOS: 89 U/L (ref 39–117)
ALT: 18 U/L (ref 0–35)
AST: 19 U/L (ref 0–37)
BUN: 16 mg/dL (ref 6–23)
CALCIUM: 9 mg/dL (ref 8.4–10.5)
CO2: 28 meq/L (ref 19–32)
Chloride: 107 mEq/L (ref 96–112)
Creatinine, Ser: 0.8 mg/dL (ref 0.4–1.2)
GFR: 80.26 mL/min (ref >60.00)
GLUCOSE: 104 mg/dL — AB (ref 70–99)
POTASSIUM: 4.2 meq/L (ref 3.5–5.1)
Sodium: 140 mEq/L (ref 135–145)
Total Bilirubin: 0.2 mg/dL (ref 0.2–1.2)
Total Protein: 6.6 g/dL (ref 6.0–8.3)

## 2014-04-15 LAB — VITAMIN D 25 HYDROXY (VIT D DEFICIENCY, FRACTURES): VITD: 30.66 ng/mL

## 2014-04-15 LAB — HEMOGLOBIN A1C: Hgb A1c MFr Bld: 5.9 % (ref 4.6–6.5)

## 2014-04-15 LAB — TSH: TSH: 0.64 u[IU]/mL (ref 0.35–4.50)

## 2014-04-18 ENCOUNTER — Encounter (INDEPENDENT_AMBULATORY_CARE_PROVIDER_SITE_OTHER): Payer: Self-pay

## 2014-04-18 ENCOUNTER — Encounter: Payer: Self-pay | Admitting: Family Medicine

## 2014-04-18 ENCOUNTER — Ambulatory Visit (INDEPENDENT_AMBULATORY_CARE_PROVIDER_SITE_OTHER): Payer: Medicare Other | Admitting: Family Medicine

## 2014-04-18 VITALS — BP 128/88 | HR 79 | Temp 98.4°F | Ht 60.5 in | Wt 187.8 lb

## 2014-04-18 DIAGNOSIS — M949 Disorder of cartilage, unspecified: Secondary | ICD-10-CM

## 2014-04-18 DIAGNOSIS — E669 Obesity, unspecified: Secondary | ICD-10-CM

## 2014-04-18 DIAGNOSIS — I10 Essential (primary) hypertension: Secondary | ICD-10-CM

## 2014-04-18 DIAGNOSIS — E0590 Thyrotoxicosis, unspecified without thyrotoxic crisis or storm: Secondary | ICD-10-CM

## 2014-04-18 DIAGNOSIS — M899 Disorder of bone, unspecified: Secondary | ICD-10-CM

## 2014-04-18 DIAGNOSIS — Z0000 Encounter for general adult medical examination without abnormal findings: Secondary | ICD-10-CM

## 2014-04-18 DIAGNOSIS — R7309 Other abnormal glucose: Secondary | ICD-10-CM

## 2014-04-18 DIAGNOSIS — Z23 Encounter for immunization: Secondary | ICD-10-CM

## 2014-04-18 MED ORDER — PAROXETINE HCL 10 MG PO TABS: ORAL_TABLET | ORAL | Status: DC

## 2014-04-18 MED ORDER — NISOLDIPINE ER 34 MG PO TB24: ORAL_TABLET | ORAL | Status: DC

## 2014-04-18 MED ORDER — FOSINOPRIL SODIUM 20 MG PO TABS: ORAL_TABLET | ORAL | Status: DC

## 2014-04-18 NOTE — Patient Instructions (Signed)
Don't forget to schedule your colonoscopy and also your mammogram  Get your tetanus shot at the [facility] department prevnar vaccine today  Get vitamin D3 over the counter - and take 2000 iu daily   Labs are ok  So is blood pressure  Work on weight loss and a low sugar diet

## 2014-04-18 NOTE — Assessment & Plan Note (Signed)
tsh stable  No clinical changes Remote hx of graves  Nl exam

## 2014-04-18 NOTE — Assessment & Plan Note (Signed)
Reviewed health habits including diet and exercise and skin cancer prevention Reviewed appropriate screening tests for age  Also reviewed health mt list, fam hx and immunization status , as well as social and family history   Pt will schedule her own mammogram/ colonosc and Td prevnar today Lab rev

## 2014-04-18 NOTE — Progress Notes (Signed)
Subjective:    Patient ID: Kerry Simpson, female    DOB: 1946-08-30, 68 y.o.   MRN: [id number]  HPI I have personally reviewed the Medicare Annual Wellness questionnaire and have noted 1. The patient's medical and social history 2. Their use of alcohol, tobacco or illicit drugs 3. Their current medications and supplements 4. The patient's functional ability including ADL's, fall risks, home safety risks and hearing or visual             impairment. 5. Diet and physical activities 6. Evidence for depression or mood disorders  The patients weight, height, BMI have been recorded in the chart and visual acuity is per eye clinic.  I have made referrals, counseling and provided education to the patient based review of the above and I have provided the pt with a written personalized care plan for preventive services.  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  Routine anticipatory guidance given to patient.  See health maintenance. Colon cancer screening 3/12 colonoscopy - just got a letter that it is due - will schedule that  Breast cancer screening 4/14 mammogram - will schedule that also- has the phone number  Self breast exam-no lumps  Did pap in 2014- up to date  Flu vaccine did not get the flu vaccine this past season  Tetanus vaccine - needs that , last 9/04 -- will call health dept about that  Pneumovax 11/11- the pneumovax 23- will get prevnar today Zoster vaccine 2/13- up to date   Advance directive- has a living will set up  Cognitive function addressed- see scanned forms- and if abnormal then additional documentation follows. - no major worries , occ forgets names   PMH and SH reviewed  Meds, vitals, and allergies reviewed.   ROS: See HPI.  Otherwise negative  .   bp is stable today    No cp or palpitations or headaches or edema  No side effects to medicines  BP Readings from Last 3 Encounters:  04/18/14 128/88  09/07/13 122/74  04/14/13 142/80     Lab Results  Component Value Date   TSH 0.64 04/15/2014   hx of Graves in the past   D level 30.65- low normal range  Not taking any D  dexa 6/14 -osteopenia  No fractures  She will begin taking supplements   Hyperglycemia Lab Results  Component Value Date   HGBA1C 5.9 04/15/2014   knows goal is to loose wt  No excessive thirst or urination Eats too much sugar     Lab Results  Component Value Date   CHOL 142 04/15/2014   HDL 41.90 04/15/2014   LDLCALC 86 04/15/2014   TRIG 73.0 04/15/2014   CHOLHDL 3 04/15/2014   good profile    Patient Active Problem List   Diagnosis Date Noted  . Bruise 09/07/2013  . Encounter for routine gynecological examination 04/14/2013  . Encounter for Medicare annual wellness exam 03/30/2013  . Right-sided chest wall pain 12/07/2012  . Other screening mammogram 01/08/2012  . STRESS REACTION, ACUTE, WITH EMOTIONAL DISTURBANCE 11/26/2010  . HYPERGLYCEMIA 09/28/2010  . OSTEOPENIA 06/21/2009  . HYPERTHYROIDISM 04/26/2008  . OBESITY 04/26/2008  . HYPERTENSION, ESSENTIAL NOS 04/26/2008   Past Medical History  Diagnosis Date  .  Grave's disease   . Anemia     iron def  . Obesity   . Hypertension   . Fibrocystic breast   . Osteoporosis     osteopenia   Past Surgical History  Procedure Laterality Date  . Fracture surgery      shoulder  . Cervical stenosis     History  Substance Use Topics  . Smoking status: Never Smoker   . Smokeless tobacco: Not on file  . Alcohol Use: No   Family History  Problem Relation Age of Onset  . Hypertension Mother   . Diabetes Father   . Hypertension Father   . Hypertension Brother   . Cancer Brother     prostate  . Heart disease Brother     with bypass   No Known Allergies Current Outpatient Prescriptions on File Prior to Visit   Medication Sig Dispense Refill  . aspirin 325 MG EC tablet Take 325 mg by mouth daily.      . fosinopril (MONOPRIL) 20 MG tablet TAKE 1 TABLET BY MOUTH ONCE A DAY  90 tablet  1  . naproxen sodium (ANAPROX) 220 MG tablet Take 220 mg by mouth 2 (two) times daily with a meal.       . nisoldipine (SULAR) 34 MG 24 hr tablet TAKE 1 TABLET BY MOUTH ONCE A DAY  90 tablet  0  . PARoxetine (PAXIL) 10 MG tablet TAKE 1 TABLET BY MOUTH EVERY EVENING  90 tablet  0   No current facility-administered medications on file prior to visit.    Review of Systems Review of Systems  Constitutional: Negative for fever, appetite change, fatigue and unexpected weight change.  Eyes: Negative for pain and visual disturbance.  Respiratory: Negative for cough and shortness of breath.   Cardiovascular: Negative for cp or palpitations    Gastrointestinal: Negative for nausea, diarrhea and constipation.  Genitourinary: Negative for urgency and frequency.  Skin: Negative for pallor or rash   Neurological: Negative for weakness, light-headedness, numbness and headaches.  Hematological: Negative for adenopathy. Does not bruise/bleed easily.  Psychiatric/Behavioral: Negative for dysphoric mood. The patient is not nervous/anxious.         Objective:   Physical Exam  Constitutional: She appears well-developed and well-nourished. No distress.  obese and well appearing   HENT:  Head: Normocephalic and atraumatic.  Right Ear: External ear normal.  Left Ear: External ear normal.  Nose: Nose normal.  Mouth/Throat: Oropharynx is clear and moist.  Eyes: Conjunctivae and EOM are normal. Pupils are equal, round, and reactive to light. Right eye exhibits no discharge. Left eye exhibits no discharge. No scleral icterus.  Neck: Normal range of motion. Neck supple. No JVD present. Carotid bruit is not present. No thyromegaly present.  Cardiovascular: Normal rate, regular rhythm, normal heart sounds and intact distal pulses.  Exam  reveals no gallop.   Pulmonary/Chest: Effort normal and breath sounds normal. No respiratory distress. She has no wheezes. She has no rales.  Abdominal: Soft. Bowel sounds are normal. She exhibits no distension and no mass. There is no tenderness.  Genitourinary: No breast swelling, tenderness, discharge or bleeding.  Breast exam: No mass, nodules, thickening, tenderness, bulging, retraction, inflamation, nipple discharge or skin changes noted.  No axillary or clavicular LA.      Musculoskeletal: She exhibits no edema and no tenderness.  Lymphadenopathy:    She has no cervical adenopathy.  Neurological: She is alert. She has normal reflexes. No cranial nerve deficit. She exhibits normal  muscle tone. Coordination normal.  Skin: Skin is warm and dry. No rash noted. No erythema. No pallor.  Psychiatric: She has a normal mood and affect.          Assessment & Plan:   Problem List Items Addressed This Visit     Cardiovascular and Mediastinum   HYPERTENSION, ESSENTIAL NOS - Primary   Relevant Medications      fosinopril (MONOPRIL) tablet      nisoldipine (SULAR) 24 hr tablet     Endocrine   HYPERTHYROIDISM     tsh stable  No clinical changes Remote hx of graves  Nl exam      Musculoskeletal and Integument   OSTEOPENIA     dexa 6/14 Urged to start vit D(level in 30s) Disc need for calcium/ vitamin D/ wt bearing exercise and bone density test every 2 y to monitor Disc safety/ fracture risk in detail        Other   OBESITY     Discussed how this problem influences overall health and the risks it imposes  Reviewed plan for weight loss with lower calorie diet (via better food choices and also portion control or program like weight watchers) and exercise building up to or more than 30 minutes 5 days per week including some aerobic activity       HYPERGLYCEMIA      Urged wt loss for prev of DM Stable A1C  Lab Results  Component Value Date   HGBA1C 5.9 04/15/2014         Encounter for Medicare annual wellness exam     Reviewed health habits including diet and exercise and skin cancer prevention Reviewed appropriate screening tests for age  Also reviewed health mt list, fam hx and immunization status , as well as social and family history   Pt will schedule her own mammogram/ colonosc and Td prevnar today Lab rev     Other Visit Diagnoses   Need for vaccination with 13-polyvalent pneumococcal conjugate vaccine        Relevant Orders       Pneumococcal conjugate vaccine 13-valent (Completed)

## 2014-04-18 NOTE — Assessment & Plan Note (Signed)
Discussed how this problem influences overall health and the risks it imposes  Reviewed plan for weight loss with lower calorie diet (via better food choices and also portion control or program like weight watchers) and exercise building up to or more than 30 minutes 5 days per week including some aerobic activity    

## 2014-04-18 NOTE — Assessment & Plan Note (Signed)
dexa 6/14 Urged to start vit D(level in 30s) Disc need for calcium/ vitamin D/ wt bearing exercise and bone density test every 2 y to monitor Disc safety/ fracture risk in detail

## 2014-04-18 NOTE — Progress Notes (Signed)
Pre visit review using our clinic review tool, if applicable. No additional management support is needed unless otherwise documented below in the visit note. 

## 2014-04-18 NOTE — Assessment & Plan Note (Signed)
Urged wt loss for prev of DM Stable A1C  Lab Results  Component Value Date   HGBA1C 5.9 04/15/2014

## 2014-04-19 ENCOUNTER — Telehealth: Payer: Self-pay | Admitting: Family Medicine

## 2014-04-19 NOTE — Telephone Encounter (Signed)
Relevant patient education assigned to patient using Emmi. ° °

## 2014-07-01 ENCOUNTER — Other Ambulatory Visit: Payer: Self-pay

## 2014-07-01 DIAGNOSIS — Z1231 Encounter for screening mammogram for malignant neoplasm of breast: Secondary | ICD-10-CM

## 2014-07-15 ENCOUNTER — Encounter (INDEPENDENT_AMBULATORY_CARE_PROVIDER_SITE_OTHER): Payer: Self-pay

## 2014-07-15 ENCOUNTER — Ambulatory Visit
Admission: RE | Admit: 2014-07-15 | Discharge: 2014-07-15 | Disposition: A | Discharge status: Home / Self Care | Payer: Medicare Other | Source: Ambulatory Visit

## 2014-07-15 DIAGNOSIS — Z1231 Encounter for screening mammogram for malignant neoplasm of breast: Secondary | ICD-10-CM

## 2014-07-19 ENCOUNTER — Encounter: Payer: Self-pay | Admitting: *Deleted

## 2014-09-15 ENCOUNTER — Ambulatory Visit (AMBULATORY_SURGERY_CENTER): Payer: Self-pay | Admitting: *Deleted

## 2014-09-15 VITALS — Ht 60.0 in | Wt 176.0 lb

## 2014-09-15 DIAGNOSIS — Z1211 Encounter for screening for malignant neoplasm of colon: Secondary | ICD-10-CM

## 2014-09-15 MED ORDER — MOVIPREP 100 G PO SOLR
ORAL | Status: DC
Start: 2014-09-15 — End: 2014-09-29

## 2014-09-15 NOTE — Progress Notes (Signed)
Patient denies any allergies to eggs or soy. Patient denies any problems with anesthesia/sedation. Patient denies any oxygen use at home and does not take any diet/weight loss medications. EMMI education assisgned to patient on colonoscopy, this was explained and instructions given to patient. 

## 2014-09-29 ENCOUNTER — Ambulatory Visit (AMBULATORY_SURGERY_CENTER): Payer: Medicare Other | Admitting: Internal Medicine

## 2014-09-29 ENCOUNTER — Encounter: Payer: Self-pay | Admitting: Internal Medicine

## 2014-09-29 VITALS — BP 140/77 | HR 72 | Temp 97.6°F | Resp 14 | Ht 61.0 in | Wt 176.0 lb

## 2014-09-29 DIAGNOSIS — Z1211 Encounter for screening for malignant neoplasm of colon: Secondary | ICD-10-CM

## 2014-09-29 HISTORY — PX: COLONOSCOPY: SHX174

## 2014-09-29 MED ORDER — SODIUM CHLORIDE 0.9 % IV SOLN: 500.0000 mL | INTRAVENOUS | Status: DC

## 2014-09-29 NOTE — Patient Instructions (Signed)

## 2014-09-29 NOTE — Progress Notes (Signed)
Report to PACU, RN, vss, BBS= Clear.  

## 2014-09-29 NOTE — Op Note (Signed)
[facility]  [address] [place], [address]   COLONOSCOPY PROCEDURE REPORT  PATIENT: Kerry Simpson, Kerry Simpson  MR#: [id number] BIRTHDATE: 09/16/1946 , 68  yrs. old GENDER: female ENDOSCOPIST: Lafayette Dragon, MD REFERRED QJ:JHERD Vernell Morgans, M.D. PROCEDURE DATE:  09/29/2014 PROCEDURE:   Colonoscopy, screening First Screening Colonoscopy - Avg.  risk and is 50 yrs.  old or older - No.  Prior Negative Screening - Now for repeat screening. 10 or more years since last screening  History of Adenoma - Now for follow-up colonoscopy & has been > or = to 3 yrs.  N/A  Polyps Removed Today? No. ASA CLASS:   Class II INDICATIONS:last colon 06/2004 was normal. MEDICATIONS: Monitored anesthesia care and Propofol 200 mg IV  DESCRIPTION OF PROCEDURE:   After the risks benefits and alternatives of the procedure were thoroughly explained, informed consent was obtained.  The digital rectal exam revealed no abnormalities of the rectum.   The LB [id number]  endoscope was introduced through the anus and advanced to the cecum, which was identified by both the appendix and ileocecal valve. No adverse events experienced.   The quality of the prep was good, using MoviPrep  The instrument was then slowly withdrawn as the colon was fully examined.      COLON FINDINGS: A normal appearing cecum, ileocecal valve, and appendiceal orifice were identified.  The ascending, transverse, descending, sigmoid colon, and rectum appeared unremarkable. Retroflexed views revealed no abnormalities. The time to cecum=5 minutes 47 seconds.  Withdrawal time=6 minutes 35 seconds.  The scope was withdrawn and the procedure completed. COMPLICATIONS: There were no immediate complications.  ENDOSCOPIC IMPRESSION: Normal colonoscopy  RECOMMENDATIONS: High fiber diet recall colonoscopy 10 years  eSigned:  Lafayette Dragon, MD 09/29/2014 9:11 AM   cc:

## 2014-09-30 ENCOUNTER — Telehealth: Payer: Self-pay

## 2014-09-30 NOTE — Telephone Encounter (Signed)
No answer, left vm 

## 2014-12-05 ENCOUNTER — Ambulatory Visit (INDEPENDENT_AMBULATORY_CARE_PROVIDER_SITE_OTHER): Payer: Medicare Other | Admitting: Family Medicine

## 2014-12-05 ENCOUNTER — Encounter: Payer: Self-pay | Admitting: Family Medicine

## 2014-12-05 VITALS — BP 116/70 | HR 104 | Temp 98.5°F | Ht 61.0 in | Wt 169.4 lb

## 2014-12-05 DIAGNOSIS — R1011 Right upper quadrant pain: Secondary | ICD-10-CM

## 2014-12-05 LAB — HEPATIC FUNCTION PANEL
ALT: 16 U/L (ref 0–35)
AST: 18 U/L (ref 0–37)
Albumin: 3.6 g/dL (ref 3.5–5.2)
Alkaline Phosphatase: 89 U/L (ref 39–117)
BILIRUBIN DIRECT: 0.1 mg/dL (ref 0.0–0.3)
TOTAL PROTEIN: 6.3 g/dL (ref 6.0–8.3)
Total Bilirubin: 0.2 mg/dL (ref 0.2–1.2)

## 2014-12-05 LAB — CBC WITH DIFFERENTIAL/PLATELET
Basophils Absolute: 0 10*3/uL (ref 0.0–0.1)
Basophils Relative: 0.7 % (ref 0.0–3.0)
Eosinophils Absolute: 0.2 10*3/uL (ref 0.0–0.7)
Eosinophils Relative: 3.7 % (ref 0.0–5.0)
HCT: 32.2 % — ABNORMAL LOW (ref 36.0–46.0)
Hemoglobin: 10.7 g/dL — ABNORMAL LOW (ref 12.0–15.0)
LYMPHS ABS: 1.5 10*3/uL (ref 0.7–4.0)
Lymphocytes Relative: 21.7 % (ref 12.0–46.0)
MCHC: 33.3 g/dL (ref 30.0–36.0)
MCV: 84.1 fl (ref 78.0–100.0)
Monocytes Absolute: 0.7 10*3/uL (ref 0.1–1.0)
Monocytes Relative: 10.7 % (ref 3.0–12.0)
Neutro Abs: 4.3 10*3/uL (ref 1.4–7.7)
Neutrophils Relative %: 63.2 % (ref 43.0–77.0)
PLATELETS: 376 10*3/uL (ref 150.0–400.0)
RBC: 3.84 Mil/uL — AB (ref 3.87–5.11)
RDW: 14.4 % (ref 11.5–15.5)
WBC: 6.8 10*3/uL (ref 4.0–10.5)

## 2014-12-05 LAB — LIPASE: LIPASE: 29 U/L (ref 11.0–59.0)

## 2014-12-05 LAB — BASIC METABOLIC PANEL
BUN: 16 mg/dL (ref 6–23)
CALCIUM: 8.8 mg/dL (ref 8.4–10.5)
CO2: 25 meq/L (ref 19–32)
Chloride: 105 mEq/L (ref 96–112)
Creatinine, Ser: 0.71 mg/dL (ref 0.40–1.20)
GFR: 86.66 mL/min (ref >60.00)
GLUCOSE: 106 mg/dL — AB (ref 70–99)
Potassium: 4.1 mEq/L (ref 3.5–5.1)
SODIUM: 138 meq/L (ref 135–145)

## 2014-12-05 LAB — AMYLASE: Amylase: 57 U/L (ref 27–131)

## 2014-12-05 NOTE — Assessment & Plan Note (Signed)
With tenderness  Also nausea/bloating after eating  Suspect gallbladder etiology -disc this  Lab today abd [place] planned  Then make plan  inst to call/ seek care if suddenly worse

## 2014-12-05 NOTE — Patient Instructions (Signed)
I wonder if your gallbladder is bothering you  Lab today  Stop at check out to order an ultrasound  Then we will make a plan  Eat a low fat bland diet - avoid grease   Update me if symptoms suddenly worsen

## 2014-12-05 NOTE — Progress Notes (Signed)
Subjective:    Patient ID: Kerry Simpson, female    DOB: 10-06-1946, 69 y.o.   MRN: [id number]  HPI Here with RUQ abd pain   Spells  Sometimes a sharp pain  occ radiates to the left and down  When she eats - she feels very bloated and sometimes nausea (no vomiting) occ loose stool Once had a dark stool - that stopped (no iron pills/ occ pepto bismol)  No heartburn   She still has a gallbladder   Stomach bubbles a lot   Patient Active Problem List   Diagnosis Date Noted  . Encounter for routine gynecological examination 04/14/2013  . Encounter for Medicare annual wellness exam 03/30/2013  . Other screening mammogram 01/08/2012  . STRESS REACTION, ACUTE, WITH EMOTIONAL DISTURBANCE 11/26/2010  . HYPERGLYCEMIA 09/28/2010  . OSTEOPENIA 06/21/2009  . HYPERTHYROIDISM 04/26/2008  . OBESITY 04/26/2008  . HYPERTENSION, ESSENTIAL NOS 04/26/2008   Past Medical History  Diagnosis Date  . Grave's disease   . Anemia     iron def  . Obesity   . Hypertension   . Fibrocystic breast   . Osteoporosis     osteopenia  . Fracture of right shoulder    Past Surgical History  Procedure Laterality Date  . Tubal ligation     History  Substance Use Topics  . Smoking status: Never Smoker   . Smokeless tobacco: Never Used  . Alcohol Use: No   Family History  Problem Relation Age of Onset  . Hypertension Mother   . Diabetes Father   . Hypertension Father   . Hypertension Brother   . Cancer Brother     prostate  . Heart disease Brother     with bypass  . Colon cancer Neg Hx    No Known Allergies Current Outpatient Prescriptions on File Prior to Visit  Medication Sig Dispense Refill  . aspirin 325 MG EC tablet Take 325 mg by mouth daily.    . Cholecalciferol (VITAMIN D PO) Take 1 tablet by mouth daily.    . Cyanocobalamin (VITAMIN B 12 PO) Take 1 tablet by mouth daily.    . fosinopril (MONOPRIL) 20 MG tablet TAKE 1 TABLET BY MOUTH ONCE A DAY 90 tablet 3  . Multiple Vitamin  (MULTIVITAMIN) tablet Take 1 tablet by mouth daily.    . naproxen sodium (ANAPROX) 220 MG tablet Take 220 mg by mouth 2 (two) times daily with a meal.     . nisoldipine (SULAR) 34 MG 24 hr tablet TAKE 1 TABLET BY MOUTH ONCE A DAY 90 tablet 3  . PARoxetine (PAXIL) 10 MG tablet TAKE 1 TABLET BY MOUTH EVERY EVENING 90 tablet 3   No current facility-administered medications on file prior to visit.     Review of Systems Review of Systems  Constitutional: Negative for fever, appetite change, fatigue and unexpected weight change.  Eyes: Negative for pain and visual disturbance.  Respiratory: Negative for cough and shortness of breath.   Cardiovascular: Negative for cp or palpitations    Gastrointestinal: Negative for nausea, diarrhea and constipation. pos for RUQ pain and bloating/ indigestion , neg for blood in stool  Genitourinary: Negative for urgency and frequency. neg for change in urine color  Skin: Negative for pallor or rash   Neurological: Negative for weakness, light-headedness, numbness and headaches.  Hematological: Negative for adenopathy. Does not bruise/bleed easily.  Psychiatric/Behavioral: Negative for dysphoric mood. The patient is not nervous/anxious.         Objective:  Physical Exam  Constitutional: She appears well-developed and well-nourished. No distress.  obese and well appearing   HENT:  Head: Normocephalic and atraumatic.  Mouth/Throat: Oropharynx is clear and moist.  Eyes: Conjunctivae and EOM are normal. Pupils are equal, round, and reactive to light. No scleral icterus.  Neck: Normal range of motion. Neck supple. No JVD present.  Cardiovascular: Normal rate, regular rhythm and normal heart sounds.   Pulmonary/Chest: Effort normal and breath sounds normal. No respiratory distress. She has no wheezes. She has no rales.  Abdominal: Soft. Bowel sounds are normal. She exhibits no distension and no mass. There is no hepatosplenomegaly. There is tenderness in the  right upper quadrant. There is no rebound, no guarding and no CVA tenderness.  Musculoskeletal: She exhibits no edema.  Lymphadenopathy:    She has no cervical adenopathy.  Neurological: She is alert. She has normal reflexes.  Skin: Skin is warm and dry. No rash noted. No erythema. No pallor.  No jaundice   Psychiatric: She has a normal mood and affect.          Assessment & Plan:   Problem List Items Addressed This Visit      Other   RUQ pain - Primary    With tenderness  Also nausea/bloating after eating  Suspect gallbladder etiology -disc this  Lab today abd [place] planned  Then make plan  inst to call/ seek care if suddenly worse       Relevant Orders   CBC with Differential/Platelet (Completed)   Hepatic function panel (Completed)   Basic metabolic panel (Completed)   Amylase (Completed)   Lipase (Completed)   US Abdomen Complete

## 2014-12-05 NOTE — Progress Notes (Signed)
Pre visit review using our clinic review tool, if applicable. No additional management support is needed unless otherwise documented below in the visit note. 

## 2014-12-06 ENCOUNTER — Other Ambulatory Visit: Payer: Self-pay | Admitting: *Deleted

## 2014-12-06 MED ORDER — OMEPRAZOLE 20 MG PO CPDR: 20.0000 mg | DELAYED_RELEASE_CAPSULE | Freq: Every day | ORAL | Status: DC

## 2014-12-06 NOTE — Telephone Encounter (Signed)
Called in Rx and inform patient.

## 2014-12-07 ENCOUNTER — Telehealth: Payer: Self-pay | Admitting: Family Medicine

## 2014-12-07 ENCOUNTER — Ambulatory Visit: Payer: Self-pay | Admitting: Family Medicine

## 2014-12-07 ENCOUNTER — Encounter: Payer: Self-pay | Admitting: Family Medicine

## 2014-12-07 DIAGNOSIS — K8020 Calculus of gallbladder without cholecystitis without obstruction: Secondary | ICD-10-CM | POA: Insufficient documentation

## 2014-12-07 DIAGNOSIS — R1011 Right upper quadrant pain: Secondary | ICD-10-CM

## 2014-12-07 NOTE — Telephone Encounter (Signed)
Will ref to gen surgery for eval of gallstone

## 2014-12-07 NOTE — Telephone Encounter (Signed)
-----   Message from [place], [place] sent at 12/07/2014  6:22 PM EST ----- Called patient and inform her of Dr. Alba Cory comments. She agree to the referral. Patient states pain on her right side mainly when she eats and feels bloated.

## 2014-12-09 ENCOUNTER — Telehealth: Payer: Self-pay | Admitting: Family Medicine

## 2014-12-09 NOTE — Telephone Encounter (Signed)
Kerry Simpson stated she was not getting any labs results through my chart

## 2014-12-10 ENCOUNTER — Encounter: Payer: Self-pay | Admitting: Family Medicine

## 2014-12-12 ENCOUNTER — Other Ambulatory Visit: Payer: Self-pay | Admitting: General Surgery

## 2014-12-12 ENCOUNTER — Ambulatory Visit (INDEPENDENT_AMBULATORY_CARE_PROVIDER_SITE_OTHER): Payer: Medicare Other | Admitting: General Surgery

## 2014-12-12 ENCOUNTER — Encounter: Payer: Self-pay | Admitting: General Surgery

## 2014-12-12 VITALS — BP 110/66 | HR 74 | Resp 12 | Ht 60.0 in | Wt 174.0 lb

## 2014-12-12 DIAGNOSIS — K8020 Calculus of gallbladder without cholecystitis without obstruction: Secondary | ICD-10-CM

## 2014-12-12 DIAGNOSIS — Z8719 Personal history of other diseases of the digestive system: Secondary | ICD-10-CM

## 2014-12-12 NOTE — Patient Instructions (Addendum)

## 2014-12-12 NOTE — Progress Notes (Signed)
Patient ID: Kerry Simpson, female   DOB: 1946-09-21, 69 y.o.   MRN: [id number]  Chief Complaint  Patient presents with  . Abdominal Pain    HPI Kerry Simpson is a 69 y.o. female here today for abdominal pain. Patient states she has been having abdominal pain for six months now .Patient states pain starts in her right outer quadrant with radiation to the back and occasionally into the epigastium. This occurs when she eats, with sharp pain fleeting but a longer period where she  feels bloated. She has not been able to pinpoint any particular food that is troublesome. No report of nocturnal awakening or reflux symptoms. A very short trial of Prilosec (1-2 days last week)was associated with increased loading and she discontinued the medication. Patient had a ultrasound done 12/07/14 showing cholelithiasis. The patient reports making use of phentermine beginning in September 2015 with a 20 pound weight loss over the next 4 months. She reports at this time her weight has plateaued. Her present symptoms of abdominal pain pre-date the initiation of phentermine.  She had dark black stool during the first week of January 2016. This lasted for about 5 days. She denied any change in her abdominal symptoms during this time. The patient was not making use of Pepto-Bismol during this time.  HPI  Past Medical History  Diagnosis Date  . Grave's disease   . Anemia     iron def  . Obesity   . Hypertension   . Fibrocystic breast   . Osteoporosis     osteopenia  . Fracture of right shoulder     Past Surgical History  Procedure Laterality Date  . Tubal ligation    . Colonoscopy  09/29/2014    Normal, Delfin Edis, MD    Family History  Problem Relation Age of Onset  . Hypertension Mother   . Diabetes Father   . Hypertension Father   . Hypertension Brother   . Cancer Brother     prostate  . Heart disease Brother     with bypass  . Colon cancer Neg Hx     Social History History  Substance  Use Topics  . Smoking status: Never Smoker   . Smokeless tobacco: Never Used  . Alcohol Use: No    No Known Allergies  Current Outpatient Prescriptions  Medication Sig Dispense Refill  . aspirin 325 MG EC tablet Take 325 mg by mouth daily.    . Cholecalciferol (VITAMIN D PO) Take 1 tablet by mouth daily.    . Cyanocobalamin (VITAMIN B 12 PO) Take 1 tablet by mouth daily.    . fosinopril (MONOPRIL) 20 MG tablet TAKE 1 TABLET BY MOUTH ONCE A DAY 90 tablet 3  . Multiple Vitamin (MULTIVITAMIN) tablet Take 1 tablet by mouth daily.    . naproxen sodium (ANAPROX) 220 MG tablet Take 220 mg by mouth 2 (two) times daily with a meal.     . nisoldipine (SULAR) 34 MG 24 hr tablet TAKE 1 TABLET BY MOUTH ONCE A DAY 90 tablet 3  . omeprazole (PRILOSEC) 20 MG capsule Take 1 capsule (20 mg total) by mouth daily. 30 capsule 1  . PARoxetine (PAXIL) 10 MG tablet TAKE 1 TABLET BY MOUTH EVERY EVENING 90 tablet 3  . phentermine 15 MG capsule Take 15 mg by mouth every morning.    . Probiotic Product (PROBIOTIC DAILY PO) Take 1 capsule by mouth.     No current facility-administered medications for this visit.  Review of Systems Review of Systems  Constitutional: Negative.   Respiratory: Negative.   Cardiovascular: Negative.   Gastrointestinal: Positive for nausea and abdominal pain. Negative for vomiting, diarrhea, constipation, blood in stool, abdominal distention, anal bleeding and rectal pain.    Blood pressure 110/66, pulse 74, resp. rate 12, height 5' (1.524 m), weight 174 lb (78.926 kg).  Physical Exam Physical Exam  Constitutional: She is oriented to person, place, and time. She appears well-developed and well-nourished.  Eyes: Conjunctivae are normal. No scleral icterus.  Neck: Neck supple.  Cardiovascular: Normal rate, regular rhythm and normal heart sounds.   Pulmonary/Chest: Effort normal and breath sounds normal.  Abdominal: Soft. Normal appearance and bowel sounds are normal. There is  no hepatomegaly. There is no tenderness. No hernia.  Lymphadenopathy:    She has no cervical adenopathy.    She has no axillary adenopathy.  Neurological: She is alert and oriented to person, place, and time.  Skin: Skin is warm and dry.    Data Reviewed Laboratory review shows a fall in her hemoglobin from 14.8 8 months ago with an MCV of 87 to 10.7 with an MCV of 84 last week. She reports that she is a regular blood donor, her last donation was Christmas week 2015, one month before the most recent blood sample. Liver function studies are unchanged over the past year and are normal.  The patient was given stool Hemoccult slides to complete but has not done this today.  Abdominal ultrasound of 12/07/2014 was reviewed. Mobile gallstones measuring up to 1.9 cm. No sonographic Murphy sign. No gallbladder wall thickening. Common bile duct 3.2 mm. Suspected parapelvic cyst in the left kidney. Nonobstructing renal calculus on the right.   Assessment    Likely symptomatic cholelithiasis.  History strongly suggestive of upper GI bleeding early last month with a significant fall in her hemoglobin.  Colonoscopy of 09/29/2014 was normal.    Plan Discussed gallbladder surgery, patient agrees. Prior to consideration for surgery upper endoscopy is indicated. The opportunity to return to Dr. Olevia Perches was offered but declined.  Arrangements have been made for patient to have upper endoscopy completed tomorrow, 12-13-14.    Patient's gallbladder surgery has been scheduled for 12-27-14 at [facility]. This patient has been asked to decrease current 325 mg aspirin to 81 mg aspirin starting one week prior to procedure.   PCP:  [place], Kerry Simpson W 12/12/2014, 8:13 PM

## 2014-12-13 ENCOUNTER — Ambulatory Visit: Payer: Self-pay | Admitting: General Surgery

## 2014-12-13 DIAGNOSIS — K921 Melena: Secondary | ICD-10-CM

## 2014-12-13 NOTE — Telephone Encounter (Signed)
Left a voicemail for patient for call back. I have been checking the labs in mychart. It show that the labs are release but some kind of delay.

## 2014-12-14 ENCOUNTER — Encounter: Payer: Self-pay | Admitting: General Surgery

## 2014-12-14 ENCOUNTER — Telehealth: Payer: Self-pay | Admitting: *Deleted

## 2014-12-14 NOTE — Telephone Encounter (Signed)
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 Thanks.

## 2014-12-14 NOTE — Telephone Encounter (Signed)
Patient had called the answering service yesterday at 6:24 pm and informed them that she was having pain in jaw status post upper endoscopy which was completed yesterday. I spoke with patient this morning and she reports that jaw pain has been resolved.  This patient has been scheduled for a CT abdomen with contrast at [facility] for 12-15-14 at 2 pm (arrive 1:45 pm). Prep: no solids 4 hours prior but patient may have clear liquids up until exam time, pick up prep kit, and take medication list. Patient verbalizes understanding.

## 2014-12-15 ENCOUNTER — Ambulatory Visit: Payer: Self-pay | Admitting: General Surgery

## 2014-12-16 ENCOUNTER — Telehealth: Payer: Self-pay | Admitting: General Surgery

## 2014-12-16 ENCOUNTER — Encounter: Payer: Self-pay | Admitting: General Surgery

## 2014-12-16 ENCOUNTER — Telehealth: Payer: Self-pay

## 2014-12-16 NOTE — Telephone Encounter (Signed)
Patient is scheduled for an upper GI series at [facility] on 12/20/14 at 9:30 am. She is to have nothing to eat or drink after midnight the night before. She is to arrive by 9:15 am. Patient is aware of date, time, and instructions.

## 2014-12-16 NOTE — Telephone Encounter (Signed)
Patient called the day after her procedure report bilateral jaw pain. This was likely secondary to the jaw thrust next surgery to provide adequate oxygenation during sedation. This has resolved.  CT scan of the abdomen obtained yesterday was reviewed independently and with the radiologist. There is loss of luminal diameter corresponding to the area seen on endoscopy. No extraluminal source noted. No adenopathy. No abnormality of the head of the pancreas.  We'll arrange for an upper GI to better delineate the defect. 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  Patient may be a candidate for endoscopic ultrasound, this is being investigated with the GI department of [facility].

## 2014-12-16 NOTE — Telephone Encounter (Signed)
-----   Message from Robert Bellow, MD sent at 12/16/2014 12:35 PM EST ----- Please arrange for an upper GI series. 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 Patient will be expecting your call

## 2014-12-20 ENCOUNTER — Encounter: Payer: Self-pay | Admitting: General Surgery

## 2014-12-20 ENCOUNTER — Ambulatory Visit: Payer: Self-pay | Admitting: General Surgery

## 2014-12-21 ENCOUNTER — Other Ambulatory Visit: Payer: Medicare Other

## 2014-12-22 ENCOUNTER — Telehealth: Payer: Self-pay | Admitting: General Surgery

## 2014-12-22 NOTE — Telephone Encounter (Signed)
Reviewed plans for GI evaluation w/ Dr. Ardis Hughs at [facility] prior to cholecystectomy. Clarified that patient has taken two Aleve daily for decades, no recent increase. Stopped after recent EGD.  Case discussed w/ Dr. Ardis Hughs re: abnormal imaging of proximal second portion of the duodenum.  Will hold cholecystectomy pending GI evaluation.

## 2014-12-23 ENCOUNTER — Other Ambulatory Visit: Payer: Medicare Other

## 2014-12-23 ENCOUNTER — Other Ambulatory Visit: Payer: Self-pay | Admitting: Family Medicine

## 2014-12-23 DIAGNOSIS — Z1211 Encounter for screening for malignant neoplasm of colon: Secondary | ICD-10-CM

## 2014-12-26 ENCOUNTER — Other Ambulatory Visit (INDEPENDENT_AMBULATORY_CARE_PROVIDER_SITE_OTHER): Payer: Medicare Other

## 2014-12-26 ENCOUNTER — Other Ambulatory Visit: Payer: Self-pay | Admitting: Family Medicine

## 2014-12-26 DIAGNOSIS — Z1211 Encounter for screening for malignant neoplasm of colon: Secondary | ICD-10-CM

## 2014-12-26 LAB — HEMOCCULT SLIDES (X 3 CARDS)
FECAL OCCULT BLD: NEGATIVE
OCCULT 1: NEGATIVE
OCCULT 2: NEGATIVE
OCCULT 3: NEGATIVE
OCCULT 4: NEGATIVE
OCCULT 5: NEGATIVE

## 2014-12-27 ENCOUNTER — Telehealth: Payer: Self-pay | Admitting: *Deleted

## 2014-12-27 NOTE — Telephone Encounter (Signed)
Pt was calling to see if you have spoken with Dr.Jacobs at [facility]? She was just wanted to know what to do next.

## 2014-12-28 ENCOUNTER — Telehealth: Payer: Self-pay

## 2014-12-28 NOTE — Telephone Encounter (Signed)
I spoke with Abigail Butts at Dr [facility] office and inquired about having the patient being seen by him. I let her know that Selinda Eon had spoken with Patty there last week about this and that the patient had not been contacted yet. She will leave a message with Chong Sicilian about this.

## 2014-12-28 NOTE — Telephone Encounter (Signed)
-----   Message from Kathline Magic sent at 12/28/2014  8:46 AM EST ----- Hoyle Sauer @ [facility] is f/u on getting this patient scheduled for an EUS. [phone].

## 2014-12-28 NOTE — Telephone Encounter (Signed)
-----   Message from Robert Bellow, MD sent at 12/28/2014  7:39 AM EST ----- I spoke with Dr. Ardis Hughs from GI at [facility], and I thought that his office had been contacted to schedule an appointment with the patient for repeat upper endoscopy and endoscopic ultrasound. Last contact with the patient yesterday reported that she had not been contacted by GI. Please contact his office and see what is up. Thanks.

## 2014-12-28 NOTE — Telephone Encounter (Signed)
Dr Jacobs please review for EUS  

## 2014-12-29 ENCOUNTER — Other Ambulatory Visit: Payer: Self-pay

## 2014-12-29 DIAGNOSIS — K319 Disease of stomach and duodenum, unspecified: Secondary | ICD-10-CM

## 2014-12-29 NOTE — Telephone Encounter (Signed)
EUS scheduled, pt instructed and medications reviewed.  Patient instructions in My Chart.  Patient to call with any questions or concerns.

## 2014-12-29 NOTE — Telephone Encounter (Signed)
She needs upper EUS, radial +/- linear, next available EUS Thursday with MAC, for abnormal duodenum.  Thanks

## 2015-01-02 ENCOUNTER — Encounter (HOSPITAL_COMMUNITY): Payer: Self-pay | Admitting: *Deleted

## 2015-01-03 ENCOUNTER — Telehealth: Payer: Self-pay | Admitting: Gastroenterology

## 2015-01-03 NOTE — Telephone Encounter (Signed)
Pt aware she does not need to stop her Iron for EUS

## 2015-01-04 ENCOUNTER — Encounter: Payer: Self-pay | Admitting: Family Medicine

## 2015-01-05 ENCOUNTER — Encounter: Payer: Self-pay | Admitting: General Surgery

## 2015-01-05 ENCOUNTER — Telehealth: Payer: Self-pay | Admitting: Family Medicine

## 2015-01-05 MED ORDER — AMLODIPINE BESYLATE 5 MG PO TABS: 5.0000 mg | ORAL_TABLET | Freq: Every day | ORAL | Status: DC

## 2015-01-05 NOTE — Telephone Encounter (Signed)
Switching sular to amlodipine

## 2015-01-09 ENCOUNTER — Encounter: Payer: Self-pay | Admitting: General Surgery

## 2015-01-10 ENCOUNTER — Telehealth: Payer: Self-pay | Admitting: Gastroenterology

## 2015-01-10 ENCOUNTER — Telehealth: Payer: Self-pay | Admitting: *Deleted

## 2015-01-10 NOTE — Telephone Encounter (Signed)
The patient had sent a Emessage asking about having an EKG prior to her endoscopy Thursday by Dr. Ardis Hughs at [facility]. Advised that they would be the ones to make that decision and to call them, she agrees.

## 2015-01-11 NOTE — Telephone Encounter (Signed)
FYI pt states she stopped breathing during her last EGD.  She wants to make sure you are aware prior to procedure on 01/12/15

## 2015-01-11 NOTE — Telephone Encounter (Signed)
Left message on machine to call back  

## 2015-01-12 ENCOUNTER — Encounter (HOSPITAL_COMMUNITY)
Admission: RE | Disposition: A | Discharge status: Home / Self Care | Payer: Self-pay | Source: Ambulatory Visit | Attending: Gastroenterology

## 2015-01-12 ENCOUNTER — Encounter (HOSPITAL_COMMUNITY): Payer: Self-pay | Admitting: Gastroenterology

## 2015-01-12 ENCOUNTER — Ambulatory Visit (HOSPITAL_COMMUNITY): Payer: Medicare Other | Admitting: Registered Nurse

## 2015-01-12 ENCOUNTER — Ambulatory Visit (HOSPITAL_COMMUNITY)
Admission: RE | Admit: 2015-01-12 | Discharge: 2015-01-12 | Disposition: A | Discharge status: Home / Self Care | Payer: Medicare Other | Source: Ambulatory Visit | Attending: Gastroenterology | Admitting: Gastroenterology

## 2015-01-12 DIAGNOSIS — K219 Gastro-esophageal reflux disease without esophagitis: Secondary | ICD-10-CM | POA: Insufficient documentation

## 2015-01-12 DIAGNOSIS — Z9889 Other specified postprocedural states: Secondary | ICD-10-CM | POA: Insufficient documentation

## 2015-01-12 DIAGNOSIS — K8020 Calculus of gallbladder without cholecystitis without obstruction: Secondary | ICD-10-CM | POA: Diagnosis not present

## 2015-01-12 DIAGNOSIS — Z87442 Personal history of urinary calculi: Secondary | ICD-10-CM | POA: Insufficient documentation

## 2015-01-12 DIAGNOSIS — K319 Disease of stomach and duodenum, unspecified: Secondary | ICD-10-CM

## 2015-01-12 DIAGNOSIS — K2950 Unspecified chronic gastritis without bleeding: Secondary | ICD-10-CM | POA: Insufficient documentation

## 2015-01-12 DIAGNOSIS — R140 Abdominal distension (gaseous): Secondary | ICD-10-CM | POA: Diagnosis present

## 2015-01-12 DIAGNOSIS — M810 Age-related osteoporosis without current pathological fracture: Secondary | ICD-10-CM | POA: Diagnosis not present

## 2015-01-12 DIAGNOSIS — E669 Obesity, unspecified: Secondary | ICD-10-CM | POA: Insufficient documentation

## 2015-01-12 DIAGNOSIS — Z6832 Body mass index (BMI) 32.0-32.9, adult: Secondary | ICD-10-CM | POA: Diagnosis not present

## 2015-01-12 DIAGNOSIS — M8580 Other specified disorders of bone density and structure, unspecified site: Secondary | ICD-10-CM | POA: Insufficient documentation

## 2015-01-12 DIAGNOSIS — K269 Duodenal ulcer, unspecified as acute or chronic, without hemorrhage or perforation: Secondary | ICD-10-CM | POA: Diagnosis not present

## 2015-01-12 DIAGNOSIS — K315 Obstruction of duodenum: Secondary | ICD-10-CM | POA: Insufficient documentation

## 2015-01-12 DIAGNOSIS — R1011 Right upper quadrant pain: Secondary | ICD-10-CM | POA: Diagnosis present

## 2015-01-12 DIAGNOSIS — I10 Essential (primary) hypertension: Secondary | ICD-10-CM | POA: Diagnosis not present

## 2015-01-12 HISTORY — PX: EUS: SHX5427

## 2015-01-12 HISTORY — DX: Personal history of urinary calculi: Z87.442

## 2015-01-12 HISTORY — DX: Adverse effect of unspecified anesthetic, initial encounter: T41.45XA

## 2015-01-12 HISTORY — DX: Other complications of anesthesia, initial encounter: T88.59XA

## 2015-01-12 SURGERY — UPPER ENDOSCOPIC ULTRASOUND (EUS) LINEAR
Anesthesia: Monitor Anesthesia Care

## 2015-01-12 MED ORDER — GLYCOPYRROLATE 0.2 MG/ML IJ SOLN
INTRAMUSCULAR | Status: DC | PRN
  Administered 2015-01-12: 0.2 mg via INTRAVENOUS

## 2015-01-12 MED ORDER — ONDANSETRON HCL 4 MG/2ML IJ SOLN
INTRAMUSCULAR | Status: DC | PRN
  Administered 2015-01-12: 4 mg via INTRAVENOUS

## 2015-01-12 MED ORDER — LIDOCAINE HCL (CARDIAC) 20 MG/ML IV SOLN
INTRAVENOUS | Status: DC | PRN
  Administered 2015-01-12: 100 mg via INTRAVENOUS

## 2015-01-12 MED ORDER — BUTAMBEN-TETRACAINE-BENZOCAINE 2-2-14 % EX AERO
INHALATION_SPRAY | CUTANEOUS | Status: DC | PRN
  Administered 2015-01-12: 2 via TOPICAL

## 2015-01-12 MED ORDER — DEXAMETHASONE SODIUM PHOSPHATE 10 MG/ML IJ SOLN
INTRAMUSCULAR | Status: DC | PRN
  Administered 2015-01-12: 10 mg via INTRAVENOUS

## 2015-01-12 MED ORDER — SUCCINYLCHOLINE CHLORIDE 20 MG/ML IJ SOLN
INTRAMUSCULAR | Status: DC | PRN
  Administered 2015-01-12: 100 mg via INTRAVENOUS

## 2015-01-12 MED ORDER — GLYCOPYRROLATE 0.2 MG/ML IJ SOLN
INTRAMUSCULAR | Status: AC
  Filled 2015-01-12: qty 1

## 2015-01-12 MED ORDER — LACTATED RINGERS IV SOLN
INTRAVENOUS | Status: DC | PRN
  Administered 2015-01-12: 08:00:00 via INTRAVENOUS

## 2015-01-12 MED ORDER — LIDOCAINE HCL (CARDIAC) 20 MG/ML IV SOLN
INTRAVENOUS | Status: AC
  Filled 2015-01-12: qty 5

## 2015-01-12 MED ORDER — PROPOFOL INFUSION 10 MG/ML OPTIME
INTRAVENOUS | Status: DC | PRN
  Administered 2015-01-12: 50 ug/kg/min via INTRAVENOUS

## 2015-01-12 MED ORDER — PROPOFOL 10 MG/ML IV BOLUS
INTRAVENOUS | Status: AC
  Filled 2015-01-12: qty 20

## 2015-01-12 MED ORDER — PROPOFOL 10 MG/ML IV BOLUS: INTRAVENOUS | Status: DC | PRN

## 2015-01-12 MED ORDER — SODIUM CHLORIDE 0.9 % IV SOLN: INTRAVENOUS | Status: DC

## 2015-01-12 MED ORDER — PROPOFOL 10 MG/ML IV BOLUS
INTRAVENOUS | Status: DC | PRN
  Administered 2015-01-12: 120 mg via INTRAVENOUS

## 2015-01-12 MED ORDER — LACTATED RINGERS IV SOLN
INTRAVENOUS | Status: DC
  Administered 2015-01-12: 1000 mL via INTRAVENOUS

## 2015-01-12 NOTE — Anesthesia Postprocedure Evaluation (Signed)
  Anesthesia Post-op Note  Patient: Kerry Simpson  Procedure(s) Performed: Procedure(s): UPPER ENDOSCOPIC ULTRASOUND (EUS) LINEAR (N/A)  Patient Location: PACU  Anesthesia Type:General  Level of Consciousness: awake and alert   Airway and Oxygen Therapy: Patient Spontanous Breathing  Post-op Pain: none  Post-op Assessment: Post-op Vital signs reviewed, Patient's Cardiovascular Status Stable, Respiratory Function Stable, Patent Airway, No signs of Nausea or vomiting and Pain level controlled  Post-op Vital Signs: Reviewed and stable  Last Vitals:  Filed Vitals:   01/12/15 0923  BP: 141/79  Pulse:   Temp:   Resp:     Complications: No apparent anesthesia complications

## 2015-01-12 NOTE — Op Note (Signed)
[facility] [place], [address]   ENDOSCOPIC ULTRASOUND PROCEDURE REPORT  PATIENT: Kerry Simpson, Kerry Simpson  MR#: [id number] BIRTHDATE: 1946-08-19  GENDER: female ENDOSCOPIST: Milus Banister, MD REFERRED BY:  Hervey Ard, MD PROCEDURE DATE:  01/12/2015 PROCEDURE:   Upper EUS, EGD with biopsy ASA CLASS:      Class II INDICATIONS:   intermitent RUQ pains, bloating; found to have gallstones and also duodenal stricture (Dr.  Bary Castilla EGD 12/2014); CT suggested thickened duodenum, UGI patent lumen. MEDICATIONS: Per Anesthesia  DESCRIPTION OF PROCEDURE:   After the risks benefits and alternatives of the procedure were  explained, informed consent was obtained. The patient was then placed in the left, lateral, decubitus postion and IV sedation was administered. Throughout the procedure, the patients blood pressure, pulse and oxygen saturations were monitored continuously.  Under direct visualization, the Pentax Radial EUS [id number]  endoscope was introduced through the mouth  and advanced to the bulb of duodenum .  Water was used as necessary to provide an acoustic interface. Upon completion of the imaging, water was removed and the patient was sent to the recovery room in satisfactory condition.  Endoscopic findings (with standard adult gastroscope and radial echoendoscope): 1. Normal esophagus. 2. Mild non-specific gastritis (biopsies taken distally). 3. Normal duodenal bulb, however there was a benign appearing stricture immediately post bulbar that limited passage of gastroscope and echoendoscope. The lumen through the stricture was 5-45mm, the mucosa within the stricture was biopsied.  EUS findings: 1. The wall of the duodenum (limited to bulb and proximal aspect of the sticture) was mildly, non-specifically thickened. There was not clear mass, tumor. 2. No perigastric, periportal adenopathy. 3. Pancreatic parenchyma was normal. 4. Main pancreatic  duct was normal. 5. CBD was normal, non-dilated 6. Shadowing 7-27mm gallstone in the gallbladder. 7. Limited  views of liver, spleen, portal and splenic vessels were all normal.  ENDOSCOPIC IMPRESSION: Benign appearing post bulbar stricture, mild gastritis.  Limited views of the duodenum with echoendoscope were normal.  The stomach and duodenal stricture were both biopsied. I suspect the stricture is related to years of daily "unopposed" NSAIDs (alleve and 325 aspirin).  Her symptoms have improved somewhat since limiting the NSAIDs and started daily PPI.  Await final pathology.  Will likely follow clinically.   _______________________________ eSignedMilus Banister, MD 01/12/2015 9:20 AM

## 2015-01-12 NOTE — Anesthesia Preprocedure Evaluation (Signed)
Anesthesia Evaluation  Patient identified by MRN, date of birth, ID band Patient awake    Reviewed: Allergy & Precautions, NPO status , Patient's Chart, lab work & pertinent test results  History of Anesthesia Complications Negative for: history of anesthetic complications  Airway Mallampati: III  TM Distance: <3 FB Neck ROM: Full    Dental  (+) Teeth Intact   Pulmonary neg pulmonary ROS,  breath sounds clear to auscultation        Cardiovascular hypertension, Pt. on medications - angina- Past MI and - CHF Rhythm:Regular     Neuro/Psych PSYCHIATRIC DISORDERS Depression negative neurological ROS     GI/Hepatic Neg liver ROS, GERD-  Medicated and Controlled,  Endo/Other  Morbid obesityH/o Graves disease  Renal/GU negative Renal ROS     Musculoskeletal   Abdominal   Peds  Hematology   Anesthesia Other Findings   Reproductive/Obstetrics                             Anesthesia Physical Anesthesia Plan  ASA: II  Anesthesia Plan: MAC   Post-op Pain Management:    Induction: Intravenous  Airway Management Planned: Natural Airway  Additional Equipment:   Intra-op Plan:   Post-operative Plan:   Informed Consent: I have reviewed the patients History and Physical, chart, labs and discussed the procedure including the risks, benefits and alternatives for the proposed anesthesia with the patient or authorized representative who has indicated his/her understanding and acceptance.   Dental advisory given  Plan Discussed with: CRNA and Surgeon  Anesthesia Plan Comments:         Anesthesia Quick Evaluation

## 2015-01-12 NOTE — Discharge Instructions (Signed)
TRY TO COMPLETELY REFRAIN FROM ANY FURTHER ALLEVE, MOTRIN, IBUPROFEN, ALL OTHER NSAIDS AS BEST AS POSSIBLE.   YOU HAD AN ENDOSCOPIC PROCEDURE TODAY: Refer to the procedure report that was given to you for any specific questions about what was found during the examination.  If the procedure report does not answer your questions, please call your gastroenterologist to clarify.  YOU SHOULD EXPECT: Some feelings of bloating in the abdomen. Passage of more gas than usual.  Walking can help get rid of the air that was put into your GI tract during the procedure and reduce the bloating. If you had a lower endoscopy (such as a colonoscopy or flexible sigmoidoscopy) you may notice spotting of blood in your stool or on the toilet paper.   DIET: Your first meal following the procedure should be a light meal and then it is ok to progress to your normal diet.  A half-sandwich or bowl of soup is an example of a good first meal.  Heavy or fried foods are harder to digest and may make you feel nasueas or bloated.  Drink plenty of fluids but you should avoid alcoholic beverages for 24 hours.  ACTIVITY: Your care partner should take you home directly after the procedure.  You should plan to take it easy, moving slowly for the rest of the day.  You can resume normal activity the day after the procedure however you should NOT DRIVE or use heavy machinery for 24 hours (because of the sedation medicines used during the test).    SYMPTOMS TO REPORT IMMEDIATELY  A gastroenterologist can be reached at any hour.  Please call your doctor's office for any of the following symptoms:   Following lower endoscopy (colonoscopy, flexible sigmoidoscopy)  Excessive amounts of blood in the stool  Significant tenderness, worsening of abdominal pains  Swelling of the abdomen that is new, acute  Fever of 100 or higher  Following upper endoscopy (EGD, EUS, ERCP)  Vomiting of blood or coffee ground material  New, significant  abdominal pain  New, significant chest pain or pain under the shoulder blades  Painful or persistently difficult swallowing  New shortness of breath  Black, tarry-looking stools  FOLLOW UP: If any biopsies were taken you will be contacted by phone or by letter within the next 1-3 weeks.  Call your gastroenterologist if you have not heard about the biopsies in 3 weeks.  Please also call your gastroenterologist's office with any specific questions about appointments or follow up tests.

## 2015-01-12 NOTE — Transfer of Care (Signed)
Immediate Anesthesia Transfer of Care Note  Patient: Kerry Simpson  Procedure(s) Performed: Procedure(s): UPPER ENDOSCOPIC ULTRASOUND (EUS) LINEAR (N/A)  Patient Location: PACU  Anesthesia Type:General  Level of Consciousness: awake, alert , oriented and patient cooperative  Airway & Oxygen Therapy: Patient Spontanous Breathing and Patient connected to face mask oxygen  Post-op Assessment: Report given to RN, Post -op Vital signs reviewed and stable and Patient moving all extremities X 4  Post vital signs: stable  Last Vitals:  Filed Vitals:   01/12/15 0923  BP: 141/79  Pulse:   Temp:   Resp:     Complications: No apparent anesthesia complications

## 2015-01-12 NOTE — Telephone Encounter (Signed)
Dr. Bary Castilla did not tell me about that.  I will pass it on to anesthesia this AM.

## 2015-01-12 NOTE — H&P (Signed)
  HPI: This is a very pleasant woman, sent by Dr. Bary Castilla for EUS.  Has intermittent RUQ pain, bloating, post prandial.  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  CT suggested thickening of duodenum. UGI no obstruction  Has been on alleve 2 pills daily and ASA one pill daily for many years.  Just recently alleve stopped, asa changed to 81 mg and omep started   Past Medical History  Diagnosis Date  . Grave's disease   . Anemia     iron def  . Obesity   . Hypertension   . Fibrocystic breast   . Osteoporosis     osteopenia  . Fracture of right shoulder   . Complication of anesthesia     "quit breathing" -Endoscopy( 12-22-14)  . History of kidney stones     not a problem at present    Past Surgical History  Procedure Laterality Date  . Tubal ligation    . Colonoscopy  09/29/2014    Normal, Delfin Edis, MD  . Esophagogastroduodenoscopy endoscopy      Current Facility-Administered Medications  Medication Dose Route Frequency Provider Last Rate Last Dose  . 0.9 %  sodium chloride infusion   Intravenous Continuous Milus Banister, MD        Allergies as of 12/29/2014  . (No Known Allergies)    Family History  Problem Relation Age of Onset  . Hypertension Mother   . Diabetes Father   . Hypertension Father   . Hypertension Brother   . Cancer Brother     prostate  . Heart disease Brother     with bypass  . Colon cancer Neg Hx     History   Social History  . Marital Status: Married    Spouse Name: N/A  . Number of Children: N/A  . Years of Education: N/A   Occupational History  . Retired    Social History Main Topics  . Smoking status: Never Smoker   . Smokeless tobacco: Never Used  . Alcohol Use: No  . Drug Use: No  . Sexual Activity: Not on file   Other Topics Concern  . 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  For EUS, EGD today.

## 2015-01-13 ENCOUNTER — Encounter (HOSPITAL_COMMUNITY): Payer: Self-pay | Admitting: Gastroenterology

## 2015-01-17 ENCOUNTER — Telehealth: Payer: Self-pay | Admitting: General Surgery

## 2015-01-17 NOTE — Telephone Encounter (Signed)
Just called the  patient to let her know I had received Dr. Ardis Hughs notes as well as the good results on her biopsies. She was encouraged to call if she had recurrent symptoms.  It seems likely that her gallstones are asymptomatic, and may not require surgical intervention.

## 2015-01-18 ENCOUNTER — Encounter: Payer: Self-pay | Admitting: General Surgery

## 2015-01-19 ENCOUNTER — Telehealth: Payer: Self-pay | Admitting: *Deleted

## 2015-01-19 NOTE — Telephone Encounter (Signed)
-----   Message from Robert Bellow, MD sent at 01/19/2015  9:17 AM EST ----- Notify the patient that if she is doing well she does not need to come for a follow up visit. If she begins to have pain under her right rib cage or shoulder blade, that is likely the gallbladder and she should call for re-evaluation. Thanks.

## 2015-01-19 NOTE — Telephone Encounter (Signed)
Notify the patient that if she is doing well she does not need to come for a follow up visit. If she begins to have pain under her right rib cage or shoulder blade, that is likely the gallbladder and she should call for re-evaluation.  Thanks.

## 2015-01-23 ENCOUNTER — Encounter: Payer: Self-pay | Admitting: Family Medicine

## 2015-01-24 ENCOUNTER — Telehealth: Payer: Self-pay | Admitting: Family Medicine

## 2015-01-24 MED ORDER — OMEPRAZOLE 20 MG PO CPDR: 20.0000 mg | DELAYED_RELEASE_CAPSULE | Freq: Every day | ORAL | Status: DC

## 2015-01-24 NOTE — Telephone Encounter (Signed)
Needs px for omeprazole 20  Dr Fleet Contras originally px it  Needs refills and he wants her to stay on it Will send to her pharmacy

## 2015-03-06 LAB — SURGICAL PATHOLOGY

## 2015-06-07 ENCOUNTER — Other Ambulatory Visit: Payer: Self-pay | Admitting: Family Medicine

## 2015-06-07 NOTE — Telephone Encounter (Signed)
Please schedule annual exam for winter and refill until then

## 2015-06-07 NOTE — Telephone Encounter (Signed)
Electronic refill request, no recent/future appt., please advise  

## 2015-06-08 NOTE — Telephone Encounter (Signed)
appt scheduled and med refilled 

## 2015-07-13 ENCOUNTER — Other Ambulatory Visit: Payer: Self-pay | Admitting: Family Medicine

## 2015-07-13 NOTE — Telephone Encounter (Signed)
Electronic refill request, pt has CPE scheduled on 10/13/15, last refilled on 04/18/14 #90 with 3 additional refills, please advise

## 2015-07-13 NOTE — Telephone Encounter (Signed)
Please refil for a year

## 2015-07-13 NOTE — Telephone Encounter (Signed)
done

## 2015-08-25 ENCOUNTER — Ambulatory Visit (INDEPENDENT_AMBULATORY_CARE_PROVIDER_SITE_OTHER): Payer: Medicare Other | Admitting: Family Medicine

## 2015-08-25 ENCOUNTER — Encounter: Payer: Self-pay | Admitting: Family Medicine

## 2015-08-25 VITALS — BP 126/68 | HR 97 | Temp 98.2°F | Ht 60.0 in | Wt 194.2 lb

## 2015-08-25 DIAGNOSIS — E669 Obesity, unspecified: Secondary | ICD-10-CM | POA: Diagnosis not present

## 2015-08-25 DIAGNOSIS — R7309 Other abnormal glucose: Secondary | ICD-10-CM

## 2015-08-25 DIAGNOSIS — R5382 Chronic fatigue, unspecified: Secondary | ICD-10-CM | POA: Diagnosis not present

## 2015-08-25 DIAGNOSIS — Z8719 Personal history of other diseases of the digestive system: Secondary | ICD-10-CM

## 2015-08-25 DIAGNOSIS — R5383 Other fatigue: Secondary | ICD-10-CM | POA: Insufficient documentation

## 2015-08-25 LAB — CBC WITH DIFFERENTIAL/PLATELET
BASOS PCT: 0 % (ref 0–1)
Basophils Absolute: 0 10*3/uL (ref 0.0–0.1)
EOS ABS: 0.3 10*3/uL (ref 0.0–0.7)
EOS PCT: 3 % (ref 0–5)
HCT: 40.2 % (ref 36.0–46.0)
Hemoglobin: 13.4 g/dL (ref 12.0–15.0)
LYMPHS ABS: 2.8 10*3/uL (ref 0.7–4.0)
Lymphocytes Relative: 32 % (ref 12–46)
MCH: 27.8 pg (ref 26.0–34.0)
MCHC: 33.3 g/dL (ref 30.0–36.0)
MCV: 83.4 fL (ref 78.0–100.0)
MPV: 10.3 fL (ref 8.6–12.4)
Monocytes Absolute: 0.9 10*3/uL (ref 0.1–1.0)
Monocytes Relative: 10 % (ref 3–12)
NEUTROS PCT: 55 % (ref 43–77)
Neutro Abs: 4.8 10*3/uL (ref 1.7–7.7)
PLATELETS: 257 10*3/uL (ref 150–400)
RBC: 4.82 MIL/uL (ref 3.87–5.11)
RDW: 14.3 % (ref 11.5–15.5)
WBC: 8.8 10*3/uL (ref 4.0–10.5)

## 2015-08-25 LAB — FERRITIN: FERRITIN: 18 ng/mL (ref 10–291)

## 2015-08-25 LAB — COMPREHENSIVE METABOLIC PANEL
ALBUMIN: 3.8 g/dL (ref 3.6–5.1)
ALK PHOS: 99 U/L (ref 33–130)
ALT: 16 U/L (ref 6–29)
AST: 16 U/L (ref 10–35)
BUN: 14 mg/dL (ref 7–25)
CHLORIDE: 104 mmol/L (ref 98–110)
CO2: 27 mmol/L (ref 20–31)
CREATININE: 0.97 mg/dL — AB (ref 0.60–0.93)
Calcium: 8.9 mg/dL (ref 8.6–10.4)
Glucose, Bld: 93 mg/dL (ref 65–99)
POTASSIUM: 3.9 mmol/L (ref 3.5–5.3)
Sodium: 140 mmol/L (ref 135–146)
TOTAL PROTEIN: 6.2 g/dL (ref 6.1–8.1)
Total Bilirubin: 0.3 mg/dL (ref 0.2–1.2)

## 2015-08-25 LAB — HEMOGLOBIN A1C
Hgb A1c MFr Bld: 5.8 % — ABNORMAL HIGH (ref <5.7)
MEAN PLASMA GLUCOSE: 120 mg/dL — AB (ref <117)

## 2015-08-25 LAB — TSH: TSH: 1.197 u[IU]/mL (ref 0.350–4.500)

## 2015-08-25 LAB — VITAMIN B12: Vitamin B-12: 587 pg/mL (ref 211–911)

## 2015-08-25 NOTE — Progress Notes (Signed)
Subjective:    Patient ID: Kerry Simpson, female    DOB: [birth year], 69 y.o.   MRN: [id number]  HPI Here with fatigue  No energy / "just none" Feels generally weak in all extremities   In March - she had GI problems  Had a stricture  Put her on omeprazole  Enc her to stop aleve (takes it seldomly) No more bleeding at all  Is taking iron - 1 pill daily of ferrous sulfate  Went to give blood 8 weeks ago -they let her Lab Results  Component Value Date   WBC 6.8 12/05/2014   HGB 10.7* 12/05/2014   HCT 32.2* 12/05/2014   MCV 84.1 12/05/2014   PLT 376.0 12/05/2014     Thyroid  Lab Results  Component Value Date   TSH 0.64 04/15/2014   BP Readings from Last 3 Encounters:  08/25/15 126/68  01/12/15 141/79  12/12/14 110/66     Wt is up 21 lb since March Went to a bariatric center - lost 20 lb with phentirmine  Made her heart race  Not watching her diet  A little exercise - [place]   Mood- ? Poss off  A hard summer  Anxious   No pain   Patient Active Problem List   Diagnosis Date Noted  . Fatigue 08/25/2015  . History of melena 12/12/2014  . Gallstone 12/07/2014  . RUQ pain 12/05/2014  . Encounter for routine gynecological examination 04/14/2013  . Encounter for Medicare annual wellness exam 03/30/2013  . Other screening mammogram 01/08/2012  . STRESS REACTION, ACUTE, WITH EMOTIONAL DISTURBANCE 11/26/2010  . HYPERGLYCEMIA 09/28/2010  . OSTEOPENIA 06/21/2009  . HYPERTHYROIDISM 04/26/2008  . OBESITY 04/26/2008  . HYPERTENSION, ESSENTIAL NOS 04/26/2008   Past Medical History  Diagnosis Date  . Grave's disease   . Anemia     iron def  . Obesity   . Hypertension   . Fibrocystic breast   . Osteoporosis     osteopenia  . Fracture of right shoulder   . Complication of anesthesia     "quit breathing" -Endoscopy( 12-22-14)  . History of kidney stones     not a problem at present   Past Surgical History  Procedure Laterality Date  .  Tubal ligation    . Colonoscopy  09/29/2014    Normal, Delfin Edis, MD  . Esophagogastroduodenoscopy endoscopy    . Eus N/A 01/12/2015    Procedure: UPPER ENDOSCOPIC ULTRASOUND (EUS) LINEAR;  Surgeon: Milus Banister, MD;  Location: [facility] ENDOSCOPY;  Service: Endoscopy;  Laterality: N/A;   Social History  Substance Use Topics  . Smoking status: Never Smoker   . Smokeless tobacco: Never Used  . Alcohol Use: No   Family History  Problem Relation Age of Onset  . Hypertension Mother   . Diabetes Father   . Hypertension Father   . Hypertension Brother   . Cancer Brother     prostate  . Heart disease Brother     with bypass  . Colon cancer Neg Hx    No Known Allergies Current Outpatient Prescriptions on File Prior to Visit  Medication Sig Dispense Refill  . amLODipine (NORVASC) 5 MG tablet Take 1 tablet (5 mg total) by mouth daily. 30 tablet 11  . aspirin EC 81 MG tablet Take 81 mg by mouth every morning.    . ferrous sulfate 325 (65 FE) MG tablet Take 1 mg by mouth daily with breakfast.    . fosinopril (  MONOPRIL) 20 MG tablet TAKE 1 TABLET BY MOUTH ONCE A DAY 90 tablet 1  . omeprazole (PRILOSEC) 20 MG capsule Take 1 capsule (20 mg total) by mouth daily. 30 capsule 11  . PARoxetine (PAXIL) 10 MG tablet TAKE 1 TABLET BY MOUTH ONCE EVERY EVENING 90 tablet 3   No current facility-administered medications on file prior to visit.    Review of Systems    Review of Systems  Constitutional: Negative for fever, appetite change, and unexpected weight change.  Eyes: Negative for pain and visual disturbance.  Respiratory: Negative for cough and shortness of breath.   Cardiovascular: Negative for cp or palpitations    Gastrointestinal: Negative for nausea, diarrhea and constipation. neg for blood in stool Genitourinary: Negative for urgency and frequency.  Skin: Negative for pallor or rash   Neurological: Negative for weakness, light-headedness, numbness and headaches.  Hematological:  Negative for adenopathy. Does not bruise/bleed easily.  Psychiatric/Behavioral: pos for occ dysphoric mood . The patient is not nervous/anxious.      Objective:   Physical Exam  Constitutional: She appears well-developed and well-nourished. No distress.  obese and well appearing   HENT:  Head: Normocephalic and atraumatic.  Right Ear: External ear normal.  Left Ear: External ear normal.  Nose: Nose normal.  Mouth/Throat: Oropharynx is clear and moist.  Eyes: Conjunctivae and EOM are normal. Pupils are equal, round, and reactive to light. Right eye exhibits no discharge. Left eye exhibits no discharge. No scleral icterus.  Neck: Normal range of motion. Neck supple. No JVD present. Carotid bruit is not present. No thyromegaly present.  Cardiovascular: Normal rate, regular rhythm, normal heart sounds and intact distal pulses.  Exam reveals no gallop.   Pulmonary/Chest: Effort normal and breath sounds normal. No respiratory distress. She has no wheezes. She has no rales.  Abdominal: Soft. Bowel sounds are normal. She exhibits no distension and no mass. There is no tenderness.  Musculoskeletal: She exhibits no edema or tenderness.  Lymphadenopathy:    She has no cervical adenopathy.  Neurological: She is alert. She has normal reflexes. No cranial nerve deficit. She exhibits normal muscle tone. Coordination normal.  Skin: Skin is warm and dry. No rash noted. No erythema. No pallor.  Psychiatric: She has a normal mood and affect.          Assessment & Plan:   Problem List Items Addressed This Visit      Other   Fatigue - Primary    Suspect multifactorial Lab today ? If mood plays a role  F/u 1 -2 wk to disc labs and eval further       Relevant Orders   Hemoglobin A1c (Completed)   Comprehensive metabolic panel (Completed)   TSH (Completed)   Ferritin (Completed)   CBC with Differential/Platelet (Completed)   Vit D  25 hydroxy (rtn osteoporosis monitoring) (Completed)    Vitamin B12 (Completed)   History of melena    On iron for iron def anemia No longer has melena or blood in stool  Lab today  Is fatigued       Relevant Orders   Hemoglobin A1c (Completed)   Comprehensive metabolic panel (Completed)   TSH (Completed)   Ferritin (Completed)   CBC with Differential/Platelet (Completed)   Vit D  25 hydroxy (rtn osteoporosis monitoring) (Completed)   Vitamin B12 (Completed)   HYPERGLYCEMIA    A1C today Wt gain noted  Fatigue discussed       Relevant Orders   Hemoglobin A1c (Completed)  Comprehensive metabolic panel (Completed)   TSH (Completed)   Ferritin (Completed)   CBC with Differential/Platelet (Completed)   Vit D  25 hydroxy (rtn osteoporosis monitoring) (Completed)   Vitamin B12 (Completed)   OBESITY    Gained wt back after being on phentirmine - had side eff Discussed how this problem influences overall health and the risks it imposes  Reviewed plan for weight loss with lower calorie diet (via better food choices and also portion control or program like weight watchers) and exercise building up to or more than 30 minutes 5 days per week including some aerobic activity

## 2015-08-25 NOTE — Progress Notes (Signed)
Pre visit review using our clinic review tool, if applicable. No additional management support is needed unless otherwise documented below in the visit note. 

## 2015-08-25 NOTE — Patient Instructions (Signed)
Labs today for fatigue  Follow up with me in about 1-2 weeks  I also wonder about sleep apnea -since you snore  Mood may play a role Try to get outdoors Get in some walking - gradually

## 2015-08-26 LAB — VITAMIN D 25 HYDROXY (VIT D DEFICIENCY, FRACTURES): Vit D, 25-Hydroxy: 38 ng/mL (ref 30–100)

## 2015-08-27 NOTE — Assessment & Plan Note (Signed)
Gained wt back after being on phentirmine - had side eff Discussed how this problem influences overall health and the risks it imposes  Reviewed plan for weight loss with lower calorie diet (via better food choices and also portion control or program like weight watchers) and exercise building up to or more than 30 minutes 5 days per week including some aerobic activity

## 2015-08-27 NOTE — Assessment & Plan Note (Signed)
A1C today Wt gain noted  Fatigue discussed

## 2015-08-27 NOTE — Assessment & Plan Note (Signed)
On iron for iron def anemia No longer has melena or blood in stool  Lab today  Is fatigued

## 2015-08-27 NOTE — Assessment & Plan Note (Signed)
Suspect multifactorial Lab today ? If mood plays a role  F/u 1 -2 wk to disc labs and eval further

## 2015-10-13 ENCOUNTER — Encounter: Payer: Self-pay | Admitting: Family Medicine

## 2015-10-13 ENCOUNTER — Ambulatory Visit (INDEPENDENT_AMBULATORY_CARE_PROVIDER_SITE_OTHER): Payer: Medicare Other | Admitting: Family Medicine

## 2015-10-13 VITALS — BP 126/74 | HR 82 | Temp 97.9°F | Ht 60.25 in | Wt 197.5 lb

## 2015-10-13 DIAGNOSIS — E669 Obesity, unspecified: Secondary | ICD-10-CM

## 2015-10-13 DIAGNOSIS — Z0000 Encounter for general adult medical examination without abnormal findings: Secondary | ICD-10-CM

## 2015-10-13 DIAGNOSIS — Z23 Encounter for immunization: Secondary | ICD-10-CM | POA: Diagnosis not present

## 2015-10-13 DIAGNOSIS — M8580 Other specified disorders of bone density and structure, unspecified site: Secondary | ICD-10-CM

## 2015-10-13 DIAGNOSIS — R7309 Other abnormal glucose: Secondary | ICD-10-CM

## 2015-10-13 DIAGNOSIS — I10 Essential (primary) hypertension: Secondary | ICD-10-CM | POA: Diagnosis not present

## 2015-10-13 DIAGNOSIS — E2839 Other primary ovarian failure: Secondary | ICD-10-CM | POA: Insufficient documentation

## 2015-10-13 MED ORDER — FOSINOPRIL SODIUM 20 MG PO TABS: 20.0000 mg | ORAL_TABLET | Freq: Every day | ORAL | Status: DC

## 2015-10-13 NOTE — Progress Notes (Signed)
Pre visit review using our clinic review tool, if applicable. No additional management support is needed unless otherwise documented below in the visit note. 

## 2015-10-13 NOTE — Patient Instructions (Signed)
Don't forget to schedule your mammogram  You are due for a tetanus shot - see the paper I gave you  Flu shot today  Stop at check out for ref for a bone density test  Look at the info on DASH diet   Keep working on weight loss

## 2015-10-13 NOTE — Progress Notes (Signed)
Subjective:    Patient ID: Kerry Simpson, female    DOB: 03-Aug-1946, 69 y.o.   MRN: [id number]  HPI Here for annual medicare wellness visit as well as chronic/acute medical problems and also annual preventative exam  I have personally reviewed the Medicare Annual Wellness questionnaire and have noted 1. The patient's medical and social history 2. Their use of alcohol, tobacco or illicit drugs 3. Their current medications and supplements 4. The patient's functional ability including ADL's, fall risks, home safety risks and hearing or visual             impairment. 5. Diet and physical activities 6. Evidence for depression or mood disorders  The patients weight, height, BMI have been recorded in the chart and visual acuity is per eye clinic.  I have made referrals, counseling and provided education to the patient based review of the above and I have provided the pt with a written personalized care plan for preventive services. Reviewed and updated provider list, see scanned forms.  See scanned forms.  Routine anticipatory guidance given to patient.  See health maintenance. Colon cancer screening 11/15 normal - 10 year recall  Breast cancer screening mm 9/15 nl -will schedule that  Self breast exam - no lumps  Gyn - no problems  Flu vaccine today Tetanus vaccine 9/04- due for that  Pneumovax complete on both  Zoster vaccine 2/13  dexa 6/14 osteopenia - no falls or fx - is taking her vitamin D , some walking (not every day)  Advance directive-has a living will and POA  Cognitive function addressed- see scanned forms- and if abnormal then additional documentation follows. No major problems   PMH and SH reviewed  Meds, vitals, and allergies reviewed.   ROS: See HPI.  Otherwise negative.    Gained from [place] in March Was going to a bariatric clinic and took medication  Gained it back   Had to have an endoscopy  Will f/u with her GI for GI symptoms-still having abd pain  Takes  nsaid very infrequently now -tries not to take it   bp is stable today  No cp or palpitations or headaches or edema  No side effects to medicines  BP Readings from Last 3 Encounters:  10/13/15 126/74  08/25/15 126/68  01/12/15 141/79     Thyroid  Lab Results  Component Value Date   TSH 1.197 08/25/2015    Hyperglycemia Lab Results  Component Value Date   HGBA1C 5.8* 08/25/2015      Chemistry      Component Value Date/Time   NA 140 08/25/2015 1707   K 3.9 08/25/2015 1707   CL 104 08/25/2015 1707   CO2 27 08/25/2015 1707   BUN 14 08/25/2015 1707   CREATININE 0.97* 08/25/2015 1707   CREATININE 0.71 12/05/2014 0930      Component Value Date/Time   CALCIUM 8.9 08/25/2015 1707   ALKPHOS 99 08/25/2015 1707   AST 16 08/25/2015 1707   ALT 16 08/25/2015 1707   BILITOT 0.3 08/25/2015 1707      Lab Results  Component Value Date   WBC 8.8 08/25/2015   HGB 13.4 08/25/2015   HCT 40.2 08/25/2015   MCV 83.4 08/25/2015   PLT 257 08/25/2015     Lab Results  Component Value Date   CHOL 142 04/15/2014   HDL 41.90 04/15/2014   LDLCALC 86 04/15/2014   TRIG 73.0 04/15/2014   CHOLHDL 3 04/15/2014     no chol med  Does not watch fats in diet   Patient Active Problem List   Diagnosis Date Noted  . Routine general medical examination at a health care facility 10/13/2015  . Fatigue 08/25/2015  . History of melena 12/12/2014  . Gallstone 12/07/2014  . RUQ pain 12/05/2014  . Encounter for routine gynecological examination 04/14/2013  . Encounter for Medicare annual wellness exam 03/30/2013  . Other screening mammogram 01/08/2012  . STRESS REACTION, ACUTE, WITH EMOTIONAL DISTURBANCE 11/26/2010  . HYPERGLYCEMIA 09/28/2010  . Osteopenia 06/21/2009  . HYPERTHYROIDISM 04/26/2008  . OBESITY 04/26/2008  . Essential hypertension 04/26/2008   Past Medical History  Diagnosis Date  . Grave's disease   . Anemia     iron def  . Obesity   . Hypertension   . Fibrocystic breast     . Osteoporosis     osteopenia  . Fracture of right shoulder   . Complication of anesthesia     "quit breathing" -Endoscopy( 12-22-14)  . History of kidney stones     not a problem at present   Past Surgical History  Procedure Laterality Date  . Tubal ligation    . Colonoscopy  09/29/2014    Normal, Delfin Edis, MD  . Esophagogastroduodenoscopy endoscopy    . Eus N/A 01/12/2015    Procedure: UPPER ENDOSCOPIC ULTRASOUND (EUS) LINEAR;  Surgeon: Milus Banister, MD;  Location: [facility];  Service: Endoscopy;  Laterality: N/A;   Social History  Substance Use Topics  . Smoking status: Never Smoker   . Smokeless tobacco: Never Used  . Alcohol Use: No   Family History  Problem Relation Age of Onset  . Hypertension Mother   . Diabetes Father   . Hypertension Father   . Hypertension Brother   . Cancer Brother     prostate  . Heart disease Brother     with bypass  . Colon cancer Neg Hx    No Known Allergies Current Outpatient Prescriptions on File Prior to Visit  Medication Sig Dispense Refill  . acetaminophen (TYLENOL) 325 MG tablet Take 650 mg by mouth daily as needed.    Marland Kitchen amLODipine (NORVASC) 5 MG tablet Take 1 tablet (5 mg total) by mouth daily. 30 tablet 11  . aspirin EC 81 MG tablet Take 81 mg by mouth every morning.    . Cholecalciferol (VITAMIN D-3) 1000 UNITS CAPS Take 1 capsule by mouth daily.    . fosinopril (MONOPRIL) 20 MG tablet TAKE 1 TABLET BY MOUTH ONCE A DAY 90 tablet 1  . naproxen sodium (ANAPROX) 220 MG tablet Take 220 mg by mouth as needed.    Marland Kitchen omeprazole (PRILOSEC) 20 MG capsule Take 1 capsule (20 mg total) by mouth daily. 30 capsule 11  . PARoxetine (PAXIL) 10 MG tablet TAKE 1 TABLET BY MOUTH ONCE EVERY EVENING 90 tablet 3  . Polyethyl Glycol-Propyl Glycol (SYSTANE OP) Apply to eye as needed.     No current facility-administered medications on file prior to visit.    Review of Systems Review of Systems  Constitutional: Negative for fever, appetite  change, fatigue and unexpected weight change.  Eyes: Negative for pain and visual disturbance.  Respiratory: Negative for cough and shortness of breath.   Cardiovascular: Negative for cp or palpitations    Gastrointestinal: Negative for nausea, diarrhea and constipation. pos for indigestion/ dyspepsia symptoms- seeing GI, neg for blood in stool Genitourinary: Negative for urgency and frequency.  Skin: Negative for pallor or rash   Neurological: Negative for weakness, light-headedness, numbness  and headaches.  Hematological: Negative for adenopathy. Does not bruise/bleed easily.  Psychiatric/Behavioral: Negative for dysphoric mood. The patient is not nervous/anxious.         Objective:   Physical Exam  Constitutional: She appears well-developed and well-nourished. No distress.  obese and well appearing   HENT:  Head: Normocephalic and atraumatic.  Right Ear: External ear normal.  Left Ear: External ear normal.  Mouth/Throat: Oropharynx is clear and moist.  Eyes: Conjunctivae and EOM are normal. Pupils are equal, round, and reactive to light. No scleral icterus.  Neck: Normal range of motion. Neck supple. No JVD present. Carotid bruit is not present. No thyromegaly present.  Cardiovascular: Normal rate, regular rhythm, normal heart sounds and intact distal pulses.  Exam reveals no gallop.   Pulmonary/Chest: Effort normal and breath sounds normal. No respiratory distress. She has no wheezes. She exhibits no tenderness.  Abdominal: Soft. Bowel sounds are normal. She exhibits no distension, no abdominal bruit and no mass. There is no tenderness.  Genitourinary: No breast swelling, tenderness, discharge or bleeding.  Breast exam: No mass, nodules, thickening, tenderness, bulging, retraction, inflamation, nipple discharge or skin changes noted.  No axillary or clavicular LA.      Musculoskeletal: Normal range of motion. She exhibits no edema or tenderness.  Lymphadenopathy:    She has no  cervical adenopathy.  Neurological: She is alert. She has normal reflexes. No cranial nerve deficit. She exhibits normal muscle tone. Coordination normal.  Skin: Skin is warm and dry. No rash noted. No erythema. No pallor.  Lentigo diffusely  Psychiatric: She has a normal mood and affect.  Pleasant and talkative           Assessment & Plan:   Problem List Items Addressed This Visit      Cardiovascular and Mediastinum   Essential hypertension    bp in fair control at this time  BP Readings from Last 1 Encounters:  10/13/15 126/74   No changes needed Disc lifstyle change with low sodium diet and exercise   Info on DASH diet given  Enc wt loss       Relevant Medications   fosinopril (MONOPRIL) 20 MG tablet     Musculoskeletal and Integument   Osteopenia    Ref for 2 y f/u dexa No falls or fx Disc ca and D  Disc need for calcium/ vitamin D/ wt bearing exercise and bone density test every 2 y to monitor Disc safety/ fracture risk in detail          Other   Encounter for Medicare annual wellness exam - Primary    Reviewed health habits including diet and exercise and skin cancer prevention Reviewed appropriate screening tests for age  Also reviewed health mt list, fam hx and immunization status , as well as social and family history   See HPI Labs reviewed Don't forget to schedule your mammogram  You are due for a tetanus shot - see the paper I gave you  Flu shot today  Stop at check out for ref for a bone density test  Look at the info on DASH diet   Keep working on weight loss       Estrogen deficiency   Relevant Orders   DG Bone Density   HYPERGLYCEMIA    Lab Results  Component Value Date   HGBA1C 5.8* 08/25/2015   This is fairly controlled with diet Enc low glycemic diet and wt loss to prevent DM  OBESITY    Discussed how this problem influences overall health and the risks it imposes  Reviewed plan for weight loss with lower calorie diet (via  better food choices and also portion control or program like weight watchers) and exercise building up to or more than 30 minutes 5 days per week including some aerobic activity   No long term success with bariatric surgery  Disc DASH diet and /or wt watchers program  Also exercise       Routine general medical examination at a health care facility    Reviewed health habits including diet and exercise and skin cancer prevention Reviewed appropriate screening tests for age  Also reviewed health mt list, fam hx and immunization status , as well as social and family history   See HPI Labs reviewed Don't forget to schedule your mammogram  You are due for a tetanus shot - see the paper I gave you  Flu shot today  Stop at check out for ref for a bone density test  Look at the info on DASH diet   Keep working on weight loss        Other Visit Diagnoses    Need for influenza vaccination        Relevant Orders    Flu Vaccine QUAD 36+ mos PF IM (Fluarix & Fluzone Quad PF) (Completed)

## 2015-10-15 NOTE — Assessment & Plan Note (Signed)
Ref for 2 y f/u dexa No falls or fx Disc ca and D  Disc need for calcium/ vitamin D/ wt bearing exercise and bone density test every 2 y to monitor Disc safety/ fracture risk in detail

## 2015-10-15 NOTE — Assessment & Plan Note (Signed)
bp in fair control at this time  BP Readings from Last 1 Encounters:  10/13/15 126/74   No changes needed Disc lifstyle change with low sodium diet and exercise   Info on DASH diet given  Enc wt loss

## 2015-10-15 NOTE — Assessment & Plan Note (Signed)
Discussed how this problem influences overall health and the risks it imposes  Reviewed plan for weight loss with lower calorie diet (via better food choices and also portion control or program like weight watchers) and exercise building up to or more than 30 minutes 5 days per week including some aerobic activity   No long term success with bariatric surgery  Disc DASH diet and /or wt watchers program  Also exercise

## 2015-10-15 NOTE — Assessment & Plan Note (Signed)
Reviewed health habits including diet and exercise and skin cancer prevention Reviewed appropriate screening tests for age  Also reviewed health mt list, fam hx and immunization status , as well as social and family history   See HPI Labs reviewed Don't forget to schedule your mammogram  You are due for a tetanus shot - see the paper I gave you  Flu shot today  Stop at check out for ref for a bone density test  Look at the info on [place]   Keep working on weight loss

## 2015-10-15 NOTE — Assessment & Plan Note (Signed)
Reviewed health habits including diet and exercise and skin cancer prevention Reviewed appropriate screening tests for age  Also reviewed health mt list, fam hx and immunization status , as well as social and family history   See HPI Labs reviewed Don't forget to schedule your mammogram  You are due for a tetanus shot - see the paper I gave you  Flu shot today  Stop at check out for ref for a bone density test  Look at the info on [place]   Keep working on weight loss

## 2015-10-15 NOTE — Assessment & Plan Note (Signed)
Lab Results  Component Value Date   HGBA1C 5.8* 08/25/2015   This is fairly controlled with diet Enc low glycemic diet and wt loss to prevent DM

## 2015-10-17 ENCOUNTER — Other Ambulatory Visit: Payer: Self-pay

## 2015-10-17 DIAGNOSIS — Z1231 Encounter for screening mammogram for malignant neoplasm of breast: Secondary | ICD-10-CM

## 2015-10-18 ENCOUNTER — Ambulatory Visit
Admission: RE | Admit: 2015-10-18 | Discharge: 2015-10-18 | Disposition: A | Discharge status: Home / Self Care | Payer: Medicare Other | Source: Ambulatory Visit

## 2015-10-18 DIAGNOSIS — Z1231 Encounter for screening mammogram for malignant neoplasm of breast: Secondary | ICD-10-CM

## 2015-10-26 ENCOUNTER — Ambulatory Visit
Admission: RE | Admit: 2015-10-26 | Discharge: 2015-10-26 | Disposition: A | Discharge status: Home / Self Care | Payer: Medicare Other | Source: Ambulatory Visit | Attending: Family Medicine | Admitting: Family Medicine

## 2015-10-26 DIAGNOSIS — E2839 Other primary ovarian failure: Secondary | ICD-10-CM | POA: Diagnosis not present

## 2015-10-26 DIAGNOSIS — Z780 Asymptomatic menopausal state: Secondary | ICD-10-CM | POA: Diagnosis not present

## 2016-01-15 ENCOUNTER — Other Ambulatory Visit: Payer: Self-pay | Admitting: Family Medicine

## 2016-01-24 ENCOUNTER — Other Ambulatory Visit: Payer: Self-pay | Admitting: Family Medicine

## 2016-02-14 ENCOUNTER — Encounter: Payer: Self-pay | Admitting: Family Medicine

## 2016-02-14 ENCOUNTER — Ambulatory Visit (INDEPENDENT_AMBULATORY_CARE_PROVIDER_SITE_OTHER): Payer: PPO | Admitting: Family Medicine

## 2016-02-14 VITALS — BP 112/70 | HR 107 | Temp 98.0°F | Ht 60.25 in | Wt 198.0 lb

## 2016-02-14 DIAGNOSIS — I4891 Unspecified atrial fibrillation: Secondary | ICD-10-CM | POA: Diagnosis not present

## 2016-02-14 DIAGNOSIS — R7309 Other abnormal glucose: Secondary | ICD-10-CM | POA: Diagnosis not present

## 2016-02-14 DIAGNOSIS — R5382 Chronic fatigue, unspecified: Secondary | ICD-10-CM

## 2016-02-14 DIAGNOSIS — I4892 Unspecified atrial flutter: Secondary | ICD-10-CM

## 2016-02-14 DIAGNOSIS — R000 Tachycardia, unspecified: Secondary | ICD-10-CM | POA: Diagnosis not present

## 2016-02-14 MED ORDER — METOPROLOL SUCCINATE ER 50 MG PO TB24: 50.0000 mg | ORAL_TABLET | Freq: Every day | ORAL | Status: DC

## 2016-02-14 NOTE — Progress Notes (Signed)
Pre visit review using our clinic review tool, if applicable. No additional management support is needed unless otherwise documented below in the visit note. 

## 2016-02-14 NOTE — Assessment & Plan Note (Signed)
ekg shows afib/a flutter -this is new

## 2016-02-14 NOTE — Assessment & Plan Note (Signed)
In obese female A1C today Lab Results  Component Value Date   HGBA1C 5.8* 08/25/2015

## 2016-02-14 NOTE — Assessment & Plan Note (Signed)
Lab today New a fib/flutter on her EKG

## 2016-02-14 NOTE — Patient Instructions (Addendum)
You are in a heart rhythm called a fib/ a flutter  I am going to check some labs  I am also going to refer you to cardiology -stop up front for referral  Take one regular dose aspirin daily (325 mg)-take it with food Stop amlodipine Start metoprolol xl 50 mg once daily -if any side effects call    If you develop palpitations, chest pain , worse fatigue , shortness of breath - go to the ER please

## 2016-02-14 NOTE — Progress Notes (Signed)
Subjective:    Patient ID: Kerry Simpson, female    DOB: 1945/11/30, 70 y.o.   MRN: [id number]  HPI Here for fatigue  Has been going on since the fall   Had an episode of pain in shoulders /chest and both arms with a twinge in her jaw  It lasted less than a second-fleeting  Sitting still at the computer  Not severe  Was not anxious   bp is stable today  No cp or palpitations or headaches or edema  No side effects to medicines  BP Readings from Last 3 Encounters:  02/14/16 112/70  10/13/15 126/74  08/25/15 126/68     Pulse rate is 107-high today- says she was rushing to get here in time   However EKG shows afib/flutter with HR of 130  Wt is stable 38   No signs of bleeding -no dark stool or blood in stool   Does not stay on iron -takes it on and off  Is hard on her stomach Lab Results  Component Value Date   FERRITIN 18 08/25/2015   Not getting enough exercise due to fatigue  At times she gets sob on exertion  Legs get tired   Office Visit on 08/25/2015  Component Date Value Ref Range Status  . Hgb A1c MFr Bld 08/25/2015 5.8* <5.7 % Final   Comment:                                                                        According to the ADA Clinical Practice Recommendations for 2011, when HbA1c is used as a screening test:     >=6.5%   Diagnostic of Diabetes Mellitus            (if abnormal result is confirmed)   5.7-6.4%   Increased risk of developing Diabetes Mellitus   References:Diagnosis and Classification of Diabetes Mellitus,Diabetes WUJW,1191,47(WGNFA 1):S62-S69 and Standards of Medical Care in         Diabetes - 2011,Diabetes OZHY,8657,84 (Suppl 1):S11-S61.     . Mean Plasma Glucose 08/25/2015 120* <117 mg/dL Final  . Sodium 08/25/2015 140  135 - 146 mmol/L Final  . Potassium 08/25/2015 3.9  3.5 - 5.3 mmol/L Final  . Chloride 08/25/2015 104  98 - 110 mmol/L Final  . CO2 08/25/2015 27  20 - 31 mmol/L Final  . Glucose, Bld 08/25/2015 93  65 - 99  mg/dL Final  . BUN 08/25/2015 14  7 - 25 mg/dL Final  . Creat 08/25/2015 0.97* 0.60 - 0.93 mg/dL Final  . Total Bilirubin 08/25/2015 0.3  0.2 - 1.2 mg/dL Final  . Alkaline Phosphatase 08/25/2015 99  33 - 130 U/L Final  . AST 08/25/2015 16  10 - 35 U/L Final  . ALT 08/25/2015 16  6 - 29 U/L Final  . Total Protein 08/25/2015 6.2  6.1 - 8.1 g/dL Final  . Albumin 08/25/2015 3.8  3.6 - 5.1 g/dL Final  . Calcium 08/25/2015 8.9  8.6 - 10.4 mg/dL Final  . TSH 08/25/2015 1.197  0.350 - 4.500 uIU/mL Final  . Ferritin 08/25/2015 18  10 - 291 ng/mL Final  . WBC 08/25/2015 8.8  4.0 - 10.5 K/uL Final  . RBC 08/25/2015 4.82  3.87 - 5.11 MIL/uL Final  . Hemoglobin 08/25/2015 13.4  12.0 - 15.0 g/dL Final  . HCT 08/25/2015 40.2  36.0 - 46.0 % Final  . MCV 08/25/2015 83.4  78.0 - 100.0 fL Final  . MCH 08/25/2015 27.8  26.0 - 34.0 pg Final  . MCHC 08/25/2015 33.3  30.0 - 36.0 g/dL Final  . RDW 08/25/2015 14.3  11.5 - 15.5 % Final  . Platelets 08/25/2015 257  150 - 400 K/uL Final  . MPV 08/25/2015 10.3  8.6 - 12.4 fL Final  . Neutrophils Relative % 08/25/2015 55  43 - 77 % Final  . Neutro Abs 08/25/2015 4.8  1.7 - 7.7 K/uL Final  . Lymphocytes Relative 08/25/2015 32  12 - 46 % Final  . Lymphs Abs 08/25/2015 2.8  0.7 - 4.0 K/uL Final  . Monocytes Relative 08/25/2015 10  3 - 12 % Final  . Monocytes Absolute 08/25/2015 0.9  0.1 - 1.0 K/uL Final  . Eosinophils Relative 08/25/2015 3  0 - 5 % Final  . Eosinophils Absolute 08/25/2015 0.3  0.0 - 0.7 K/uL Final  . Basophils Relative 08/25/2015 0  0 - 1 % Final  . Basophils Absolute 08/25/2015 0.0  0.0 - 0.1 K/uL Final  . Smear Review 08/25/2015 Criteria for review not met   Final  . Vit D, 25-Hydroxy 08/25/2015 38  30 - 100 ng/mL Final   Comment: Vitamin D Status           25-OH Vitamin D        Deficiency                <20 ng/mL        Insufficiency         20 - 29 ng/mL        Optimal             > or = 30 ng/mL   For 25-OH Vitamin D testing on patients  on D2-supplementation and patients for whom quantitation of D2 and D3 fractions is required, the QuestAssureD 25-OH VIT D, (D2,D3), LC/MS/MS is recommended: order code [id number] (patients > 2 yrs).   . Vitamin B-12 08/25/2015 587  211 - 911 pg/mL Final      Patient Active Problem List   Diagnosis Date Noted  . Tachycardia 02/14/2016  . Routine general medical examination at a health care facility 10/13/2015  . Estrogen deficiency 10/13/2015  . Fatigue 08/25/2015  . History of melena 12/12/2014  . Gallstone 12/07/2014  . RUQ pain 12/05/2014  . Encounter for routine gynecological examination 04/14/2013  . Encounter for Medicare annual wellness exam 03/30/2013  . Other screening mammogram 01/08/2012  . STRESS REACTION, ACUTE, WITH EMOTIONAL DISTURBANCE 11/26/2010  . HYPERGLYCEMIA 09/28/2010  . Osteopenia 06/21/2009  . HYPERTHYROIDISM 04/26/2008  . OBESITY 04/26/2008  . Essential hypertension 04/26/2008   Past Medical History  Diagnosis Date  . Grave's disease   . Anemia     iron def  . Obesity   . Hypertension   . Fibrocystic breast   . Osteoporosis     osteopenia  . Fracture of right shoulder   . Complication of anesthesia     "quit breathing" -Endoscopy( 12-22-14)  . History of kidney stones     not a problem at present   Past Surgical History  Procedure Laterality Date  . Tubal ligation    . Colonoscopy  09/29/2014    Normal, Delfin Edis, MD  . Esophagogastroduodenoscopy endoscopy    .  Eus N/A 01/12/2015    Procedure: UPPER ENDOSCOPIC ULTRASOUND (EUS) LINEAR;  Surgeon: Milus Banister, MD;  Location: [facility];  Service: Endoscopy;  Laterality: N/A;   Social History  Substance Use Topics  . Smoking status: Never Smoker   . Smokeless tobacco: Never Used  . Alcohol Use: No   Family History  Problem Relation Age of Onset  . Hypertension Mother   . Diabetes Father   . Hypertension Father   . Hypertension Brother   . Cancer Brother     prostate  . Heart  disease Brother     with bypass  . Colon cancer Neg Hx    No Known Allergies Current Outpatient Prescriptions on File Prior to Visit  Medication Sig Dispense Refill  . acetaminophen (TYLENOL) 325 MG tablet Take 650 mg by mouth daily as needed.    Marland Kitchen amLODipine (NORVASC) 5 MG tablet TAKE 1 TABLET BY MOUTH ONCE A DAY 30 tablet 10  . aspirin EC 81 MG tablet Take 81 mg by mouth every morning.    . Cholecalciferol (VITAMIN D-3) 1000 UNITS CAPS Take 1 capsule by mouth daily.    . cyanocobalamin 1000 MCG tablet Take 1,000 mcg by mouth daily.    . fosinopril (MONOPRIL) 20 MG tablet Take 1 tablet (20 mg total) by mouth daily. 90 tablet 3  . naproxen sodium (ANAPROX) 220 MG tablet Take 220 mg by mouth as needed.    Marland Kitchen omeprazole (PRILOSEC) 20 MG capsule TAKE 1 CAPSULE BY MOUTH ONCE DAILY 30 capsule 10  . PARoxetine (PAXIL) 10 MG tablet TAKE 1 TABLET BY MOUTH ONCE EVERY EVENING 90 tablet 3  . Polyethyl Glycol-Propyl Glycol (SYSTANE OP) Apply to eye as needed.     No current facility-administered medications on file prior to visit.    Review of Systems Review of Systems  Constitutional: Negative for fever, appetite change,  and unexpected weight change.  Eyes: Negative for pain and visual disturbance.  Respiratory: Negative for cough and pos for exertional shortness of breath.   Cardiovascular: Negative for cp or palpitations   (except one fleeting moment of chest pain) Gastrointestinal: Negative for nausea, diarrhea and constipation. neg for dark stool or blood in stool Genitourinary: Negative for urgency and frequency. neg for dysuria  Skin: Negative for pallor or rash   Neurological: Negative for weakness, light-headedness, numbness and headaches.  Hematological: Negative for adenopathy. Does not bruise/bleed easily.  Psychiatric/Behavioral: Negative for dysphoric mood. The patient is not nervous/anxious.         Objective:   Physical Exam  Constitutional: She appears well-developed and  well-nourished. No distress.  obese and well appearing   HENT:  Head: Normocephalic and atraumatic.  Mouth/Throat: Oropharynx is clear and moist.  Eyes: Conjunctivae and EOM are normal. Pupils are equal, round, and reactive to light.  Neck: Normal range of motion. Neck supple. No JVD present. Carotid bruit is not present. No thyromegaly present.  Cardiovascular: Normal heart sounds and intact distal pulses.  Exam reveals no gallop.   No murmur heard. Tachycardic rate 110 on exam  irreg irreg rhythm   Pulmonary/Chest: Effort normal and breath sounds normal. No respiratory distress. She has no wheezes. She has no rales. She exhibits no tenderness.  No crackles  Abdominal: Soft. Bowel sounds are normal. She exhibits no distension, no abdominal bruit and no mass. There is no tenderness. There is no rebound and no guarding.  Musculoskeletal: She exhibits no edema or tenderness.  Lymphadenopathy:    She  has no cervical adenopathy.  Neurological: She is alert. She has normal reflexes.  Skin: Skin is warm and dry. No rash noted. No pallor.  Psychiatric: She has a normal mood and affect.          Assessment & Plan:   Problem List Items Addressed This Visit      Cardiovascular and Mediastinum   Atrial fibrillation and flutter ([place])    This is new - unsure how long it has been going on  By EKG- HR of 130  Pt denies symptoms besides general fatigue and some mild sob on exertion at times (reassuring exam) Will inc her asa to 325 mg daily (with caution-has hx of gi bleed in the past) Also change amlodipine to metoprolol xl 50 mg to help rate control Lab for cbc/tsh/chem  Urgent ref to cardiology for eval        Relevant Medications   metoprolol succinate (TOPROL-XL) 50 MG 24 hr tablet   Other Relevant Orders   Ambulatory referral to Cardiology     Other   Fatigue    Lab today New a fib/flutter on her EKG       Relevant Orders   CBC with Differential/Platelet   TSH    Comprehensive metabolic panel   HYPERGLYCEMIA    In obese female A1C today Lab Results  Component Value Date   HGBA1C 5.8* 08/25/2015         Relevant Orders   Hemoglobin A1c   Tachycardia - Primary    ekg shows afib/a flutter -this is new      Relevant Orders   EKG 12-Lead (Completed)

## 2016-02-14 NOTE — Assessment & Plan Note (Signed)
This is new - unsure how long it has been going on  By EKG- HR of 130  Pt denies symptoms besides general fatigue and some mild sob on exertion at times (reassuring exam) Will inc her asa to 325 mg daily (with caution-has hx of gi bleed in the past) Also change amlodipine to metoprolol xl 50 mg to help rate control Lab for cbc/tsh/chem  Urgent ref to cardiology for eval

## 2016-02-15 ENCOUNTER — Ambulatory Visit (INDEPENDENT_AMBULATORY_CARE_PROVIDER_SITE_OTHER): Payer: PPO | Admitting: Cardiovascular Disease

## 2016-02-15 ENCOUNTER — Encounter: Payer: Self-pay | Admitting: Cardiovascular Disease

## 2016-02-15 VITALS — BP 110/80 | HR 98 | Ht 60.0 in | Wt 199.5 lb

## 2016-02-15 DIAGNOSIS — R0602 Shortness of breath: Secondary | ICD-10-CM

## 2016-02-15 DIAGNOSIS — R0789 Other chest pain: Secondary | ICD-10-CM

## 2016-02-15 DIAGNOSIS — I4891 Unspecified atrial fibrillation: Secondary | ICD-10-CM

## 2016-02-15 DIAGNOSIS — R5383 Other fatigue: Secondary | ICD-10-CM

## 2016-02-15 DIAGNOSIS — I10 Essential (primary) hypertension: Secondary | ICD-10-CM

## 2016-02-15 DIAGNOSIS — E669 Obesity, unspecified: Secondary | ICD-10-CM

## 2016-02-15 DIAGNOSIS — I4892 Unspecified atrial flutter: Secondary | ICD-10-CM

## 2016-02-15 DIAGNOSIS — Z7189 Other specified counseling: Secondary | ICD-10-CM | POA: Insufficient documentation

## 2016-02-15 LAB — HEMOGLOBIN A1C: Hgb A1c MFr Bld: 6.2 % (ref 4.6–6.5)

## 2016-02-15 LAB — COMPREHENSIVE METABOLIC PANEL
ALT: 18 U/L (ref 0–35)
AST: 19 U/L (ref 0–37)
Albumin: 4.2 g/dL (ref 3.5–5.2)
Alkaline Phosphatase: 88 U/L (ref 39–117)
BILIRUBIN TOTAL: 0.3 mg/dL (ref 0.2–1.2)
BUN: 18 mg/dL (ref 6–23)
CO2: 29 meq/L (ref 19–32)
CREATININE: 0.89 mg/dL (ref 0.40–1.20)
Calcium: 9.8 mg/dL (ref 8.4–10.5)
Chloride: 104 mEq/L (ref 96–112)
GFR: 66.53 mL/min (ref >60.00)
GLUCOSE: 81 mg/dL (ref 70–99)
Potassium: 4.1 mEq/L (ref 3.5–5.1)
SODIUM: 139 meq/L (ref 135–145)
Total Protein: 6.8 g/dL (ref 6.0–8.3)

## 2016-02-15 LAB — CBC WITH DIFFERENTIAL/PLATELET
Basophils Absolute: 0.1 10*3/uL (ref 0.0–0.1)
Basophils Relative: 0.7 % (ref 0.0–3.0)
EOS ABS: 0.3 10*3/uL (ref 0.0–0.7)
Eosinophils Relative: 3.2 % (ref 0.0–5.0)
HCT: 38.4 % (ref 36.0–46.0)
Hemoglobin: 12.4 g/dL (ref 12.0–15.0)
LYMPHS ABS: 2.5 10*3/uL (ref 0.7–4.0)
LYMPHS PCT: 29.9 % (ref 12.0–46.0)
MCHC: 32.3 g/dL (ref 30.0–36.0)
MCV: 79.5 fl (ref 78.0–100.0)
Monocytes Absolute: 0.7 10*3/uL (ref 0.1–1.0)
Monocytes Relative: 8.9 % (ref 3.0–12.0)
NEUTROS ABS: 4.7 10*3/uL (ref 1.4–7.7)
NEUTROS PCT: 57.3 % (ref 43.0–77.0)
PLATELETS: 240 10*3/uL (ref 150.0–400.0)
RBC: 4.83 Mil/uL (ref 3.87–5.11)
RDW: 16.5 % — ABNORMAL HIGH (ref 11.5–15.5)
WBC: 8.3 10*3/uL (ref 4.0–10.5)

## 2016-02-15 LAB — TSH: TSH: 1.05 u[IU]/mL (ref 0.35–4.50)

## 2016-02-15 MED ORDER — RIVAROXABAN 20 MG PO TABS: 20.0000 mg | ORAL_TABLET | Freq: Every day | ORAL | Status: DC

## 2016-02-15 MED ORDER — DIGOXIN 250 MCG PO TABS: 0.2500 mg | ORAL_TABLET | Freq: Every day | ORAL | Status: DC

## 2016-02-15 NOTE — Assessment & Plan Note (Signed)
Blood pressure running borderline low on today's visit, no other medication changes made, we'll continue metoprolol Amlodipine held previously

## 2016-02-15 NOTE — Patient Instructions (Addendum)
You are in atrial fibrillation  Please start digoxin one a day (two the first day), rate control Please start xarelto one a day, blood thinner  Stop the aspirin  We will schedule an echocardiogram for atrial fibrillation Echocardiography is a painless test that uses sound waves to create images of your heart. It provides your doctor with information about the size and shape of your heart and how well your heart's chambers and valves are working. This procedure takes approximately one hour. There are no restrictions for this procedure.  Please call the office if you have ankle edema or shortness of breath We would start a diuretic  Please call us if you have new issues that need to be addressed before your next appt.  Your physician wants you to follow-up in: 4 to 5 weeks  Echocardiogram An echocardiogram, or echocardiography, uses sound waves (ultrasound) to produce an image of your heart. The echocardiogram is simple, painless, obtained within a short period of time, and offers valuable information to your health care provider. The images from an echocardiogram can provide information such as:  Evidence of coronary artery disease (CAD).  Heart size.  Heart muscle function.  Heart valve function.  Aneurysm detection.  Evidence of a past heart attack.  Fluid buildup around the heart.  Heart muscle thickening.  Assess heart valve function. LET [facility] CARE PROVIDER KNOW ABOUT:  Any allergies you have.  All medicines you are taking, including vitamins, herbs, eye drops, creams, and over-the-counter medicines.  Previous problems you or members of your family have had with the use of anesthetics.  Any blood disorders you have.  Previous surgeries you have had.  Medical conditions you have.  Possibility of pregnancy, if this applies. BEFORE THE PROCEDURE  No special preparation is needed. Eat and drink normally.  PROCEDURE   In order to produce an image of your  heart, gel will be applied to your chest and a wand-like tool (transducer) will be moved over your chest. The gel will help transmit the sound waves from the transducer. The sound waves will harmlessly bounce off your heart to allow the heart images to be captured in real-time motion. These images will then be recorded.  You may need an IV to receive a medicine that improves the quality of the pictures. AFTER THE PROCEDURE You may return to your normal schedule including diet, activities, and medicines, unless your health care provider tells you otherwise.   This information is not intended to replace advice given to you by your health care provider. Make sure you discuss any questions you have with your health care provider.   Document Released: 10/25/2000 Document Revised: 11/18/2014 Document Reviewed: 07/05/2013 Elsevier Interactive Patient Education [employer].

## 2016-02-15 NOTE — Assessment & Plan Note (Signed)
Unclear how long she has been having atrial fibrillation/flutter May have coincided with her symptoms of fatigue that started December [id number] though uncertain no CHF symptoms, minimal shortness of breath on exertion, though she does have significant fatigue Recommended we try to restore normal sinus rhythm given her symptoms of fatigue.  Recommended she start digoxin 0.25 mill grams daily for heart rate control Suggested she stop aspirin, start xarelto  20 mg daily. She prefers a once daily medication for anticoagulation Echocardiogram has been ordered to rule out valvular heart disease, estimated ejection fraction  In the proximally 4 weeks, we could consider trying to restore normal sinus rhythm. Appointment has been scheduled. Potentially could attempt pharmacologic conversion at that time.

## 2016-02-15 NOTE — Assessment & Plan Note (Signed)
Various treatment options and types of anticoagulation discussed with her We did discuss warfarin, xarelto and eliquis. She prefers a once a day medication, we will start xarelto 20 mg daily. We did discuss possible complications from these medications, recommended she stop the medication and call our office for any significant bleeding   Total encounter time more than 45 minutes  Greater than 50% was spent in counseling and coordination of care with the patient

## 2016-02-15 NOTE — Assessment & Plan Note (Signed)
We have encouraged careful diet management in an effort to lose weight. Weight can be a risk factor for her atrial fibrillation

## 2016-02-15 NOTE — Assessment & Plan Note (Signed)
Fatigue possibly secondary to atrial fibrillation This may be one of the main reasons to pursue restoring normal sinus rhythm

## 2016-02-15 NOTE — Progress Notes (Signed)
Patient ID: Kerry Simpson, female    DOB: 28-Jan-1946, 70 y.o.   MRN: [id number]  HPI Comments: Kerry Simpson is a pleasant 70 year old woman with history of obesity, hypertension, GERD who presents by referral from Dr. Glori Bickers for new onset atrial fibrillation, fatigue.  Patient reports that symptoms of fatigue started in December 2016. She had thyroid lab work at that time which was unrevealing. She reports having continued symptoms of fatigue through 2017. Legs feel tired, weak, more recently has had some mild shortness of breath on exertion. She is unable to pinpoint when symptoms got severe.  EKG done yesterday for elevated heart rate, found to be in atrial fibrillation/flutter Aspirin was increased, started on metoprolol, amlodipine held  She denies any significant chest discomfort, no PND or orthopnea No significant leg edema, and apart from for found fatigue, has felt normal  She does report having issues with thyroid in the past, was told that she had Graves' disease but recent lab work shows normal TSH  EKG on today's visit shows atrial fibrillation/flutter with ventricular rate 98 bpm, no significant ST or T-wave changes   No Known Allergies  Current Outpatient Prescriptions on File Prior to Visit  Medication Sig Dispense Refill  . acetaminophen (TYLENOL) 325 MG tablet Take 650 mg by mouth daily as needed.    . Cholecalciferol (VITAMIN D-3) 1000 UNITS CAPS Take 1 capsule by mouth daily.    . cyanocobalamin 1000 MCG tablet Take 1,000 mcg by mouth daily.    . fosinopril (MONOPRIL) 20 MG tablet Take 1 tablet (20 mg total) by mouth daily. 90 tablet 3  . metoprolol succinate (TOPROL-XL) 50 MG 24 hr tablet Take 1 tablet (50 mg total) by mouth daily. Take with or immediately following a meal. 30 tablet 1  . omeprazole (PRILOSEC) 20 MG capsule TAKE 1 CAPSULE BY MOUTH ONCE DAILY 30 capsule 10  . PARoxetine (PAXIL) 10 MG tablet TAKE 1 TABLET BY MOUTH ONCE EVERY EVENING 90 tablet 3   . Polyethyl Glycol-Propyl Glycol (SYSTANE OP) Apply to eye as needed.     No current facility-administered medications on file prior to visit.    Past Medical History  Diagnosis Date  . Grave's disease   . Anemia     iron def  . Obesity   . Hypertension   . Fibrocystic breast   . Osteoporosis     osteopenia  . Fracture of right shoulder   . Complication of anesthesia     "quit breathing" -Endoscopy( 12-22-14)  . History of kidney stones     not a problem at present    Past Surgical History  Procedure Laterality Date  . Tubal ligation    . Colonoscopy  09/29/2014    Normal, Delfin Edis, MD  . Esophagogastroduodenoscopy endoscopy    . Eus N/A 01/12/2015    Procedure: UPPER ENDOSCOPIC ULTRASOUND (EUS) LINEAR;  Surgeon: Milus Banister, MD;  Location: [facility];  Service: Endoscopy;  Laterality: N/A;    Social History  reports that she has never smoked. She has never used smokeless tobacco. She reports that she does not drink alcohol or use illicit drugs.  Family History family history includes Cancer in her brother; Diabetes in her father; Heart disease in her brother; Hypertension in her brother, father, and mother. There is no history of Colon cancer.   Review of Systems  Constitutional: Positive for fatigue.  Respiratory: Negative.   Cardiovascular: Negative.   Gastrointestinal: Negative.   Musculoskeletal: Negative.  Neurological: Positive for weakness.  Hematological: Negative.   Psychiatric/Behavioral: Negative.   All other systems reviewed and are negative.   BP 110/80 mmHg  Pulse 98  Ht 5' (1.524 m)  Wt 199 lb 8 oz (90.493 kg)  BMI 38.96 kg/m2  Physical Exam  Constitutional: She is oriented to person, place, and time. She appears well-developed and well-nourished.  Obese  HENT:  Head: Normocephalic.  Nose: Nose normal.  Mouth/Throat: Oropharynx is clear and moist.  Eyes: Conjunctivae are normal. Pupils are equal, round, and reactive to light.   Neck: Normal range of motion. Neck supple. No JVD present.  Cardiovascular: Normal rate, normal heart sounds and intact distal pulses.  An irregularly irregular rhythm present. Exam reveals no gallop and no friction rub.   No murmur heard. Pulmonary/Chest: Effort normal and breath sounds normal. No respiratory distress. She has no wheezes. She has no rales. She exhibits no tenderness.  Abdominal: Soft. Bowel sounds are normal. She exhibits no distension. There is no tenderness.  Musculoskeletal: Normal range of motion. She exhibits no edema or tenderness.  Lymphadenopathy:    She has no cervical adenopathy.  Neurological: She is alert and oriented to person, place, and time. Coordination normal.  Skin: Skin is warm and dry. No rash noted. No erythema.  Psychiatric: She has a normal mood and affect. Her behavior is normal. Judgment and thought content normal.

## 2016-02-20 ENCOUNTER — Ambulatory Visit: Payer: PPO | Admitting: Cardiovascular Disease

## 2016-02-22 ENCOUNTER — Other Ambulatory Visit: Payer: Self-pay

## 2016-02-22 ENCOUNTER — Ambulatory Visit (INDEPENDENT_AMBULATORY_CARE_PROVIDER_SITE_OTHER): Payer: PPO

## 2016-02-22 DIAGNOSIS — R5383 Other fatigue: Secondary | ICD-10-CM | POA: Diagnosis not present

## 2016-02-22 DIAGNOSIS — R0789 Other chest pain: Secondary | ICD-10-CM

## 2016-02-22 DIAGNOSIS — R0602 Shortness of breath: Secondary | ICD-10-CM | POA: Diagnosis not present

## 2016-02-22 DIAGNOSIS — I4891 Unspecified atrial fibrillation: Secondary | ICD-10-CM | POA: Diagnosis not present

## 2016-02-28 ENCOUNTER — Telehealth: Payer: Self-pay | Admitting: Cardiovascular Disease

## 2016-02-28 NOTE — Telephone Encounter (Signed)
Patient stated that the results she received through my chart were a little confusing and she wanted to know what they meant. Reviewed her echo results with her and she verbalized understanding and had no further questions at this time. Let her know that she has an upcoming appointment with Dr. Rockey Situ and he could review in more detail and answer any other specific questions for her at that time. Let her know to please call back if any further problems or questions.

## 2016-02-28 NOTE — Telephone Encounter (Signed)
Pt would like echo results. Please call. 

## 2016-03-04 ENCOUNTER — Ambulatory Visit: Payer: PPO | Admitting: Cardiology

## 2016-03-20 ENCOUNTER — Ambulatory Visit (INDEPENDENT_AMBULATORY_CARE_PROVIDER_SITE_OTHER): Payer: PPO | Admitting: Cardiovascular Disease

## 2016-03-20 ENCOUNTER — Encounter: Payer: Self-pay | Admitting: Cardiovascular Disease

## 2016-03-20 VITALS — BP 148/90 | HR 68 | Ht 60.0 in | Wt 198.2 lb

## 2016-03-20 DIAGNOSIS — I4891 Unspecified atrial fibrillation: Secondary | ICD-10-CM

## 2016-03-20 DIAGNOSIS — I4892 Unspecified atrial flutter: Secondary | ICD-10-CM

## 2016-03-20 DIAGNOSIS — Z7189 Other specified counseling: Secondary | ICD-10-CM | POA: Diagnosis not present

## 2016-03-20 DIAGNOSIS — I10 Essential (primary) hypertension: Secondary | ICD-10-CM

## 2016-03-20 MED ORDER — FUROSEMIDE 20 MG PO TABS: 20.0000 mg | ORAL_TABLET | Freq: Every day | ORAL | Status: DC | PRN

## 2016-03-20 MED ORDER — AMLODIPINE BESYLATE 5 MG PO TABS: 5.0000 mg | ORAL_TABLET | Freq: Every day | ORAL | Status: DC

## 2016-03-20 MED ORDER — AMIODARONE HCL 200 MG PO TABS: 200.0000 mg | ORAL_TABLET | Freq: Every day | ORAL | Status: DC

## 2016-03-20 NOTE — Assessment & Plan Note (Signed)
Recommended that she restart amlodipine 5 mg daily for hypertension She provides numbers today, systolic pressure often Q000111Q, diastolic often 90

## 2016-03-20 NOTE — Assessment & Plan Note (Signed)
She reports that she has been taking xarelto daily and has not missed any days No side effects, no bleeding or excessive bruising   Total encounter time more than 25 minutes  Greater than 50% was spent in counseling and coordination of care with the patient

## 2016-03-20 NOTE — Patient Instructions (Addendum)
You are doing well.  Please start amlodipine 5 mg daily  for blood pressure  Please start amiodarone 400 mg twice a day for 5 days  Then decrease down to 200 mg twice a day  Take lasix/furosemide one a day for shortness of breath, finger swelling  Please call us if you have new issues that need to be addressed before your next appt.  Your physician wants you to follow-up in: EKG in 2 weeks If still in atrial fibrillation, we can schedule a cardioversion

## 2016-03-20 NOTE — Assessment & Plan Note (Signed)
Long discussion concerning various treatment options for her atrial fibrillation. Options include medical management versus trying to restore normal sinus rhythm. She prefers to restore normal sinus rhythm. We have recommended that she start amiodarone 400 mg twice a day down to 200 mg twice a day in 5 days. EKG in 2 weeks' time. If she continues to be in atrial fibrillation/flutter, we'll arrange cardioversion at that time

## 2016-03-20 NOTE — Progress Notes (Signed)
Patient ID: KASYN APFELBAUM, female    DOB: 15-Feb-1946, 70 y.o.   MRN: [id number]  HPI Comments: [name] is a pleasant 70 year old woman with history of obesity, hypertension, GERD who presents for routine follow-up for persistent atrial fibrillation   symptoms of fatigue started in December 2016.  continued symptoms of fatigue through 2017. Legs feel tired, weak,  mild shortness of breath on exertion.  In follow-up today, she reports having one episode of shortness of breath that woke her up at nighttime Though in general reports that she is feeling better, less shortness of breath with exertion, less fatigue Tolerating medications well, reports blood pressure is high.  Currently taking metoprolol, digoxin, fosinopril, xarelto She is interested in restoring normal sinus rhythm Echocardiogram result discussed with her showing normal LV function, mildly elevated right heart pressures  EKG on today's visit shows atrial fibrillation/flutter with ventricular rate 68 bpm  Other past medical history reviewed She does report having issues with thyroid in the past, was told that she had Graves' disease but recent lab work shows normal TSH    No Known Allergies  Current Outpatient Prescriptions on File Prior to Visit  Medication Sig Dispense Refill  . Cholecalciferol (VITAMIN D-3) 1000 UNITS CAPS Take 1 capsule by mouth daily.    . cyanocobalamin 1000 MCG tablet Take 1,000 mcg by mouth daily.    . digoxin (LANOXIN) 0.25 MG tablet Take 1 tablet (0.25 mg total) by mouth daily. 30 tablet 6  . fosinopril (MONOPRIL) 20 MG tablet Take 1 tablet (20 mg total) by mouth daily. 90 tablet 3  . metoprolol succinate (TOPROL-XL) 50 MG 24 hr tablet Take 1 tablet (50 mg total) by mouth daily. Take with or immediately following a meal. 30 tablet 1  . omeprazole (PRILOSEC) 20 MG capsule TAKE 1 CAPSULE BY MOUTH ONCE DAILY 30 capsule 10  . PARoxetine (PAXIL) 10 MG tablet TAKE 1 TABLET BY MOUTH ONCE  EVERY EVENING 90 tablet 3  . Polyethyl Glycol-Propyl Glycol (SYSTANE OP) Apply to eye as needed.    . rivaroxaban (XARELTO) 20 MG TABS tablet Take 1 tablet (20 mg total) by mouth daily with supper. 30 tablet 6   No current facility-administered medications on file prior to visit.    Past Medical History  Diagnosis Date  . Grave's disease   . Anemia     iron def  . Obesity   . Hypertension   . Fibrocystic breast   . Osteoporosis     osteopenia  . Fracture of right shoulder   . Complication of anesthesia     "quit breathing" -Endoscopy( 12-22-14)  . History of kidney stones     not a problem at present    Past Surgical History  Procedure Laterality Date  . Tubal ligation    . Colonoscopy  09/29/2014    Normal, Delfin Edis, MD  . Esophagogastroduodenoscopy endoscopy    . Eus N/A 01/12/2015    Procedure: UPPER ENDOSCOPIC ULTRASOUND (EUS) LINEAR;  Surgeon: Milus Banister, MD;  Location: [facility];  Service: Endoscopy;  Laterality: N/A;    Social History  reports that she has never smoked. She has never used smokeless tobacco. She reports that she does not drink alcohol or use illicit drugs.  Family History family history includes Cancer in her brother; Diabetes in her father; Heart disease in her brother; Hypertension in her brother, father, and mother. There is no history of Colon cancer.   Review of Systems  Constitutional:  Negative.   Respiratory: Positive for shortness of breath.   Cardiovascular: Negative.   Gastrointestinal: Negative.   Musculoskeletal: Negative.   Neurological: Negative.   Hematological: Negative.   Psychiatric/Behavioral: Negative.   All other systems reviewed and are negative.   BP 148/90 mmHg  Pulse 68  Ht 5' (1.524 m)  Wt 198 lb 4 oz (89.926 kg)  BMI 38.72 kg/m2  Physical Exam  Constitutional: She is oriented to person, place, and time. She appears well-developed and well-nourished.  Obese  HENT:  Head: Normocephalic.  Nose: Nose  normal.  Mouth/Throat: Oropharynx is clear and moist.  Eyes: Conjunctivae are normal. Pupils are equal, round, and reactive to light.  Neck: Normal range of motion. Neck supple. No JVD present.  Cardiovascular: Normal rate, normal heart sounds and intact distal pulses.  An irregularly irregular rhythm present. Exam reveals no gallop and no friction rub.   No murmur heard. Pulmonary/Chest: Effort normal and breath sounds normal. No respiratory distress. She has no wheezes. She has no rales. She exhibits no tenderness.  Abdominal: Soft. Bowel sounds are normal. She exhibits no distension. There is no tenderness.  Musculoskeletal: Normal range of motion. She exhibits no edema or tenderness.  Lymphadenopathy:    She has no cervical adenopathy.  Neurological: She is alert and oriented to person, place, and time. Coordination normal.  Skin: Skin is warm and dry. No rash noted. No erythema.  Psychiatric: She has a normal mood and affect. Her behavior is normal. Judgment and thought content normal.

## 2016-04-05 ENCOUNTER — Ambulatory Visit (INDEPENDENT_AMBULATORY_CARE_PROVIDER_SITE_OTHER): Payer: PPO | Admitting: *Deleted

## 2016-04-05 DIAGNOSIS — I4892 Unspecified atrial flutter: Secondary | ICD-10-CM | POA: Diagnosis not present

## 2016-04-05 DIAGNOSIS — I4891 Unspecified atrial fibrillation: Secondary | ICD-10-CM | POA: Diagnosis not present

## 2016-04-05 NOTE — Progress Notes (Signed)
1.) Reason for visit: EKG to evaluate if patient in Afib  2.) Name of MD requesting visit: Dr. Rockey Situ  3.) H&P: Patient has history of Afib and flutter Per office visit 03/20/16 Atrial fibrillation and flutter ([place]) - Minna Merritts, MD at 03/20/2016 8:54 AM     Status: Written Related Problem: Atrial fibrillation and flutter ([place])   Expand All Collapse All   Long discussion concerning various treatment options for her atrial fibrillation. Options include medical management versus trying to restore normal sinus rhythm. She prefers to restore normal sinus rhythm. We have recommended that she start amiodarone 400 mg twice a day down to 200 mg twice a day in 5 days. EKG in 2 weeks' time. If she continues to be in atrial fibrillation/flutter, we'll arrange cardioversion at that time          4.) ROS related to problem: Patient has not felt well for the past few weeks. She feels like she is in afib.  5.) Assessment and plan per MD: EKG shows patient remains in Afib with ventricular rate of 66. Previous visit Dr. Rockey Situ mentioned arranging for cardioversion if still in afib. Instructed patient that we would be in touch to arrange for possible cardioversion. Will forward to Dr. Rockey Situ for review.

## 2016-04-10 NOTE — Progress Notes (Signed)
EKG does show atrial fibrillation Would continue current medications Would ask her when she would like to do cardioversion

## 2016-04-10 NOTE — Progress Notes (Addendum)
Spoke w/ pt.  She would prefer to go ahead and proceed w/ DCCV as soon as she can.  Dr. Rockey Situ offered this Friday am, if possible.  Advised her that scheduling is currently closed and that I will call her as soon as I speak w/ them tomorrow am.

## 2016-04-11 ENCOUNTER — Other Ambulatory Visit: Payer: Self-pay | Admitting: Cardiovascular Disease

## 2016-04-11 ENCOUNTER — Other Ambulatory Visit
Admission: RE | Admit: 2016-04-11 | Discharge: 2016-04-11 | Disposition: A | Discharge status: Home / Self Care | Payer: PPO | Source: Ambulatory Visit | Attending: Cardiovascular Disease | Admitting: Cardiovascular Disease

## 2016-04-11 ENCOUNTER — Telehealth: Payer: Self-pay | Admitting: Cardiovascular Disease

## 2016-04-11 DIAGNOSIS — I4892 Unspecified atrial flutter: Secondary | ICD-10-CM | POA: Diagnosis not present

## 2016-04-11 DIAGNOSIS — I4891 Unspecified atrial fibrillation: Secondary | ICD-10-CM | POA: Diagnosis not present

## 2016-04-11 LAB — BASIC METABOLIC PANEL
ANION GAP: 7 (ref 5–15)
BUN: 15 mg/dL (ref 6–20)
CHLORIDE: 105 mmol/L (ref 101–111)
CO2: 26 mmol/L (ref 22–32)
Calcium: 9 mg/dL (ref 8.9–10.3)
Creatinine, Ser: 0.94 mg/dL (ref 0.44–1.00)
GFR calc non Af Amer: >60 mL/min (ref >60)
Glucose, Bld: 130 mg/dL — ABNORMAL HIGH (ref 65–99)
POTASSIUM: 4.4 mmol/L (ref 3.5–5.1)
SODIUM: 138 mmol/L (ref 135–145)

## 2016-04-11 LAB — CBC WITH DIFFERENTIAL/PLATELET
BASOS ABS: 0.1 10*3/uL (ref 0–0.1)
BASOS PCT: 2 %
EOS ABS: 0.1 10*3/uL (ref 0–0.7)
Eosinophils Relative: 2 %
HCT: 40.3 % (ref 35.0–47.0)
HEMOGLOBIN: 12.8 g/dL (ref 12.0–16.0)
LYMPHS ABS: 1.4 10*3/uL (ref 1.0–3.6)
Lymphocytes Relative: 23 %
MCH: 24.7 pg — AB (ref 26.0–34.0)
MCHC: 31.8 g/dL — ABNORMAL LOW (ref 32.0–36.0)
MCV: 77.6 fL — ABNORMAL LOW (ref 80.0–100.0)
Monocytes Absolute: 0.6 10*3/uL (ref 0.2–0.9)
Monocytes Relative: 10 %
NEUTROS PCT: 63 %
Neutro Abs: 3.7 10*3/uL (ref 1.4–6.5)
Platelets: 226 10*3/uL (ref 150–440)
RBC: 5.2 MIL/uL (ref 3.80–5.20)
RDW: 17.1 % — ABNORMAL HIGH (ref 11.5–14.5)
WBC: 5.8 10*3/uL (ref 3.6–11.0)

## 2016-04-11 LAB — PROTIME-INR
INR: 2.92
Prothrombin Time: 30 seconds — ABNORMAL HIGH (ref 11.4–15.0)

## 2016-04-11 NOTE — Telephone Encounter (Signed)
Spoke w/ pt.  She reports that during her endoscopy last year, she thinks she quit breathing during the procedure.  Reviewed pt's chart and advised her that I cannot find any documentation of an adverse event. She states "I hope I didn't dream it".   She would like to make Dr. Rockey Situ aware and make sure that she is ok to proceed w/ DCCV as planned tomorrow.

## 2016-04-11 NOTE — Telephone Encounter (Signed)
Pt is having cardioversion. Just reading over the stuff she got and read if she had any problems with Anesthesia to let us know.  Dr Tollie Pizza did endoscopy last year.  Did have some issues with Anesthesia  Just wanted to let us know about this.  Please call back.

## 2016-04-11 NOTE — Progress Notes (Signed)
Pt sched for DCCV on Friday, June 2 @ 7:30.  Pt to come to the office today to p/u lab orders to take to [place] for pre-procedure labs.  Advised her that I will leave printed instructions for her, as well.  She is appreciative and will call back w/ any questions or concerns.

## 2016-04-11 NOTE — Telephone Encounter (Signed)
The only reference I can find in her chart is from upper GI endoscopy on 12/13/14: "The upper GI endoscopy was somewhat difficult due to pt's oxygen desaturation.  Successful completion of the procedure was aided by managing pt's medical instability."

## 2016-04-11 NOTE — Addendum Note (Signed)
Addended by: Dede Query R on: 04/11/2016 08:15 AM   Modules accepted: Orders

## 2016-04-11 NOTE — Patient Instructions (Addendum)
Your physician has recommended that you have a Cardioversion (DCCV). Electrical Cardioversion uses a jolt of electricity to your heart either through paddles or wired patches attached to your chest. This is a controlled, usually prescheduled, procedure. Defibrillation is done under light anesthesia in the hospital, and you usually go home the day of the procedure. This is done to get your heart back into a normal rhythm. You are not awake for the procedure. Please see the instruction sheet given to you today.  You are scheduled for a Cardioversion on Friday, June 2 w/ Dr. Rockey Situ Please arrive at the [facility] at 6:30 a.m. on the day of your procedure.  DIET INSTRUCTIONS:  Nothing to eat or drink after midnight except your medications with a              sip of water.       Labs: Please take orders to the [facility] today for BMET, CBC, PT/INR  1) Medications:  YOU MAY TAKE ALL of your remaining medications with a small amount of water.  2) Must have a responsible person to drive you home.  3) Bring a current list of your medications and current insurance cards.    If you have any questions after you get home, please call the office at [phone]  Electrical Cardioversion Electrical cardioversion is the delivery of a jolt of electricity to change the rhythm of the heart. Sticky patches or metal paddles are placed on the chest to deliver the electricity from a device. This is done to restore a normal rhythm. A rhythm that is too fast or not regular keeps the heart from pumping well. Electrical cardioversion is done in an emergency if:   There is low or no blood pressure as a result of the heart rhythm.   Normal rhythm must be restored as fast as possible to protect the brain and heart from further damage.   It may save a life. Cardioversion may be done for heart rhythms that are not immediately life threatening, such as atrial fibrillation or flutter, in which:   The heart  is beating too fast or is not regular.   Medicine to change the rhythm has not worked.   It is safe to wait in order to allow time for preparation.  Symptoms of the abnormal rhythm are bothersome.  The risk of stroke and other serious problems can be reduced. LET [facility] CARE PROVIDER KNOW ABOUT:   Any allergies you have.  All medicines you are taking, including vitamins, herbs, eye drops, creams, and over-the-counter medicines.  Previous problems you or members of your family have had with the use of anesthetics.   Any blood disorders you have.   Previous surgeries you have had.   Medical conditions you have. RISKS AND COMPLICATIONS  Generally, this is a safe procedure. However, problems can occur and include:   Breathing problems related to the anesthetic used.  A blood clot that breaks free and travels to other parts of your body. This could cause a stroke or other problems. The risk of this is lowered by use of blood-thinning medicine (anticoagulant) prior to the procedure.  Cardiac arrest (rare). BEFORE THE PROCEDURE   You may have tests to detect blood clots in your heart and to evaluate heart function.  You may start taking anticoagulants so your blood does not clot as easily.   Medicines may be given to help stabilize your heart rate and rhythm. PROCEDURE  You  will be given medicine through an IV tube to reduce discomfort and make you sleepy (sedative).   An electrical shock will be delivered. AFTER THE PROCEDURE Your heart rhythm will be watched to make sure it does not change.    This information is not intended to replace advice given to you by your health care provider. Make sure you discuss any questions you have with your health care provider.   Document Released: 10/18/2002 Document Revised: 11/18/2014 Document Reviewed: 05/12/2013 Elsevier Interactive Patient Education 2016 [employer]. Electrical Cardioversion, Care After Refer to this  sheet in the next few weeks. These instructions provide you with information on caring for yourself after your procedure. Your health care provider may also give you more specific instructions. Your treatment has been planned according to current medical practices, but problems sometimes occur. Call your health care provider if you have any problems or questions after your procedure. WHAT TO EXPECT AFTER THE PROCEDURE After your procedure, it is typical to have the following sensations:  Some redness on the skin where the shocks were delivered. If this is tender, a sunburn lotion or hydrocortisone cream may help.  Possible return of an abnormal heart rhythm within hours or days after the procedure. HOME CARE INSTRUCTIONS  Take medicines only as directed by your health care provider. Be sure you understand how and when to take your medicine.  Learn how to feel your pulse and check it often.  Limit your activity for 48 hours after the procedure or as directed by your health care provider.  Avoid or minimize caffeine and other stimulants as directed by your health care provider. SEEK MEDICAL CARE IF:  You feel like your heart is beating too fast or your pulse is not regular.  You have any questions about your medicines.  You have bleeding that will not stop. SEEK IMMEDIATE MEDICAL CARE IF:  You are dizzy or feel faint.  It is hard to breathe or you feel short of breath.  There is a change in discomfort in your chest.  Your speech is slurred or you have trouble moving an arm or leg on one side of your body.  You get a serious muscle cramp that does not go away.  Your fingers or toes turn cold or blue.   This information is not intended to replace advice given to you by your health care provider. Make sure you discuss any questions you have with your health care provider.   Document Released: 08/18/2013 Document Revised: 11/18/2014 Document Reviewed: 08/18/2013 Elsevier Interactive  Patient Education [employer].

## 2016-04-12 ENCOUNTER — Ambulatory Visit: Payer: PPO | Admitting: Anesthesiology

## 2016-04-12 ENCOUNTER — Encounter
Admission: RE | Disposition: A | Discharge status: Home / Self Care | Payer: Self-pay | Source: Ambulatory Visit | Attending: Cardiovascular Disease

## 2016-04-12 ENCOUNTER — Encounter: Payer: Self-pay | Admitting: Anesthesiology

## 2016-04-12 ENCOUNTER — Ambulatory Visit
Admission: RE | Admit: 2016-04-12 | Discharge: 2016-04-12 | Disposition: A | Discharge status: Home / Self Care | Payer: PPO | Source: Ambulatory Visit | Attending: Cardiovascular Disease | Admitting: Cardiovascular Disease

## 2016-04-12 DIAGNOSIS — I4891 Unspecified atrial fibrillation: Secondary | ICD-10-CM | POA: Diagnosis not present

## 2016-04-12 DIAGNOSIS — Z539 Procedure and treatment not carried out, unspecified reason: Secondary | ICD-10-CM | POA: Diagnosis not present

## 2016-04-12 HISTORY — PX: ELECTROPHYSIOLOGIC STUDY: SHX172A

## 2016-04-12 SURGERY — CARDIOVERSION (CATH LAB)
Anesthesia: General

## 2016-04-12 NOTE — Anesthesia Preprocedure Evaluation (Addendum)
Anesthesia Evaluation  Patient identified by MRN, date of birth, ID band Patient awake    Reviewed: Allergy & Precautions, NPO status , Patient's Chart, lab work & pertinent test results, reviewed documented beta blocker date and time   History of Anesthesia Complications (+) history of anesthetic complications  Airway Mallampati: III  TM Distance: >3 FB     Dental  (+) Chipped   Pulmonary           Cardiovascular hypertension, Pt. on medications and Pt. on home beta blockers + dysrhythmias Atrial Fibrillation      Neuro/Psych PSYCHIATRIC DISORDERS Depression    GI/Hepatic   Endo/Other  Hyperthyroidism   Renal/GU      Musculoskeletal   Abdominal   Peds  Hematology  (+) anemia ,   Anesthesia Other Findings Obese. INR elevated, cardio aware. EF 65.   Reproductive/Obstetrics                            Anesthesia Physical Anesthesia Plan  ASA: III  Anesthesia Plan: General   Post-op Pain Management:    Induction: Intravenous  Airway Management Planned: Nasal Cannula  Additional Equipment:   Intra-op Plan:   Post-operative Plan:   Informed Consent: I have reviewed the patients History and Physical, chart, labs and discussed the procedure including the risks, benefits and alternatives for the proposed anesthesia with the patient or authorized representative who has indicated his/her understanding and acceptance.     Plan Discussed with: CRNA  Anesthesia Plan Comments:         Anesthesia Quick Evaluation

## 2016-04-13 ENCOUNTER — Other Ambulatory Visit: Payer: Self-pay | Admitting: Family Medicine

## 2016-05-06 ENCOUNTER — Ambulatory Visit (INDEPENDENT_AMBULATORY_CARE_PROVIDER_SITE_OTHER): Payer: PPO

## 2016-05-06 ENCOUNTER — Telehealth: Payer: Self-pay | Admitting: Cardiovascular Disease

## 2016-05-06 VITALS — BP 151/80 | HR 70 | Ht 60.0 in | Wt 198.0 lb

## 2016-05-06 DIAGNOSIS — I4891 Unspecified atrial fibrillation: Secondary | ICD-10-CM

## 2016-05-06 NOTE — Telephone Encounter (Signed)
Patient is feeling very tired. She didn't end up having cardioversion. She is very low energy and is concerned. Please call patient.

## 2016-05-06 NOTE — Progress Notes (Signed)
1.) Reason for visit: EKG  2.) Name of MD requesting visit: Dr. Rockey Situ  3.) H&P: Pt s/p scheduled DCCV on 04/12/16, pt found to be in NSR after being prepped for procedure.    4.) ROS related to problem: Pt called the office this am c/o no energy, reports that her sx never resolved since she was first diagnosed w/ afib.  She believes that she is going in & out of afib and that the NSR caught before her DCCV was "just a fluke".  Pt is agreeable rescheduling her DCCV if Dr. Rockey Situ recommend this.  5.) Assessment and plan per MD:  Advised pt that I will have Dr. Rockey Situ review her EKG and I will call her w/ his recommendation.   Per Dr. Rockey Situ, have pt double her amiodarone dose until we can get her in to see EP.  Pt sched to see Dr. [name] this Thursday, 6/29 @ 8:45.  She verbalizes understanding and is agreeable.

## 2016-05-06 NOTE — Telephone Encounter (Signed)
Spoke w/ pt.  She reports that she has no energy recently. HR recently: 61, 60, 84, 103 (after walking to mailbox), 61, 60, 84, 67, 64, 75. She tried to check her HR manually, reports "sometimes it's strong, sometimes it's irregular, sometimes it's weak". She is concerned that she is going in & out of afib.  Asked her to come over this afternoon for EKG.

## 2016-05-06 NOTE — Patient Instructions (Addendum)
You are in afib/flutter  Dr. Rockey Situ will review your EKG and I will call you w/ his recommendation

## 2016-05-09 ENCOUNTER — Ambulatory Visit (INDEPENDENT_AMBULATORY_CARE_PROVIDER_SITE_OTHER): Payer: PPO | Admitting: Internal Medicine

## 2016-05-09 ENCOUNTER — Encounter: Payer: Self-pay | Admitting: Internal Medicine

## 2016-05-09 ENCOUNTER — Telehealth: Payer: Self-pay | Admitting: Family Medicine

## 2016-05-09 VITALS — BP 136/82 | HR 62 | Ht 60.0 in | Wt 195.8 lb

## 2016-05-09 DIAGNOSIS — I480 Paroxysmal atrial fibrillation: Secondary | ICD-10-CM | POA: Diagnosis not present

## 2016-05-09 DIAGNOSIS — I10 Essential (primary) hypertension: Secondary | ICD-10-CM

## 2016-05-09 DIAGNOSIS — I4891 Unspecified atrial fibrillation: Secondary | ICD-10-CM

## 2016-05-09 DIAGNOSIS — I4892 Unspecified atrial flutter: Secondary | ICD-10-CM

## 2016-05-09 DIAGNOSIS — R400 Somnolence: Secondary | ICD-10-CM

## 2016-05-09 NOTE — Progress Notes (Signed)
ELECTROPHYSIOLOGY CONSULT NOTE  Patient ID: Kerry Simpson, MRN: [id number], DOB/AGE: September 03, 1946 70 y.o. Admit date: (Not on file) Date of Consult: 05/09/2016  Primary Physician: Loura Pardon, MD Primary Cardiologist: TG  Consulting Physician TG  Chief Complaint: Afib   HPI Kerry Simpson is a 70 y.o. female  Referred for consideration of management of atrial fibrillation.   She initially presented in January 2017 with symptoms of fatigue and exercise tolerance. Initial evaluation was directed and blood work which was normal;  second evaluation demonstrated tachycardia and an ECG showed atrial fibrillation with a rapid rate. At this point she is referred to Dr. Deidre Ala. Anticoagulation rate control and antiarrhythmic therapy were gradually initiated  She recently was scheduled for cardioversion. However she had reverted to sinus rhythm obvious bradycardia underwent cardioversion early June. Unfortunately, when she was seen earlier this week she had reverted to atrial fibrillation.  Unfortunately she has not felt better whether she has been in sinus rhythm or in atrial fibrillation.  She has significant sleep disordered breathing and daytime somnolence.  Echocardiogram demonstrated normal LV function and moderate left atrial enlargement (45/2.2/46)  Laboratories were normal TSH.  CHADS-VASc score is greater than or equal to 3 (hypertension, age, gender)   Past Medical History  Diagnosis Date  . Grave's disease   . Anemia     iron def  . Obesity   . Hypertension   . Fibrocystic breast   . Osteoporosis     osteopenia  . Fracture of right shoulder   . Complication of anesthesia     "quit breathing" -Endoscopy( 12-22-14)  . History of kidney stones     not a problem at present      Surgical History:  Past Surgical History  Procedure Laterality Date  . Tubal ligation    . Colonoscopy  09/29/2014    Normal, Delfin Edis, MD  . Esophagogastroduodenoscopy endoscopy    .  Eus N/A 01/12/2015    Procedure: UPPER ENDOSCOPIC ULTRASOUND (EUS) LINEAR;  Surgeon: Milus Banister, MD;  Location: [facility];  Service: Endoscopy;  Laterality: N/A;  . Electrophysiologic study N/A 04/12/2016    Procedure: CARDIOVERSION;  Surgeon: Minna Merritts, MD;  Location: [facility] ORS;  Service: Cardiovascular;  Laterality: N/A;     Home Meds: Prior to Admission medications   Medication Sig Start Date End Date Taking? Authorizing Provider  acetaminophen (TYLENOL) 500 MG tablet Take 500 mg by mouth every 6 (six) hours as needed.   Yes Historical Provider, MD  amiodarone (PACERONE) 200 MG tablet Take 200 mg by mouth 2 (two) times daily.   Yes Historical Provider, MD  amLODipine (NORVASC) 5 MG tablet Take 1 tablet (5 mg total) by mouth daily. 03/20/16  Yes Minna Merritts, MD  Cholecalciferol (VITAMIN D-3) 1000 UNITS CAPS Take 1 capsule by mouth daily.   Yes Historical Provider, MD  cyanocobalamin 1000 MCG tablet Take 1,000 mcg by mouth daily.   Yes Historical Provider, MD  digoxin (LANOXIN) 0.25 MG tablet Take 1 tablet (0.25 mg total) by mouth daily. 02/15/16  Yes Minna Merritts, MD  fosinopril (MONOPRIL) 20 MG tablet Take 1 tablet (20 mg total) by mouth daily. 10/13/15  Yes Abner Greenspan, MD  furosemide (LASIX) 20 MG tablet Take 1 tablet (20 mg total) by mouth daily as needed. 03/20/16  Yes Minna Merritts, MD  metoprolol succinate (TOPROL-XL) 50 MG 24 hr tablet TAKE 1 TABLET BY MOUTH ONCE A DAY. TAKE WITH  OR IMMEDIATELY FOLLOWIING A MEAL. 04/15/16  Yes Abner Greenspan, MD  Omega-3 Fatty Acids (FISH OIL PO) Take by mouth daily.   Yes Historical Provider, MD  omeprazole (PRILOSEC) 20 MG capsule TAKE 1 CAPSULE BY MOUTH ONCE DAILY 01/24/16  Yes Abner Greenspan, MD  PARoxetine (PAXIL) 10 MG tablet TAKE 1 TABLET BY MOUTH ONCE EVERY EVENING 07/13/15  Yes Abner Greenspan, MD  Polyethyl Glycol-Propyl Glycol (SYSTANE OP) Apply to eye as needed.   Yes Historical Provider, MD  rivaroxaban (XARELTO) 20 MG TABS  tablet Take 1 tablet (20 mg total) by mouth daily with supper. 02/15/16  Yes Minna Merritts, MD    Allergies: No Known Allergies  Social History   Social History  . Marital Status: Married    Spouse Name: N/A  . Number of Children: N/A  . Years of Education: N/A   Occupational History  . Retired    Social History Main Topics  . Smoking status: Never Smoker   . Smokeless tobacco: Never Used  . Alcohol Use: No  . Drug Use: No  . Sexual Activity: Not on file   Other Topics Concern  . Not on file   Social History Narrative     Family History  Problem Relation Age of Onset  . Hypertension Mother   . Diabetes Father   . Hypertension Father   . Hypertension Brother   . Cancer Brother     prostate  . Heart disease Brother     with bypass  . Colon cancer Neg Hx      ROS:  Please see the history of present illness.     All other systems reviewed and negative.    Physical Exam:   Blood pressure 136/82, pulse 62, height 5' (1.524 m), weight 195 lb 12 oz (88.792 kg). General: Well developed, well nourished female in no acute distress. Head: Normocephalic, atraumatic, sclera non-icteric, no xanthomas, nares are without discharge. EENT: normal  Lymph Nodes:  none Neck: Negative for carotid bruits. JVD 8-9 cm with positive HJR  Back:without scoliosis kyphosis  Lungs: Clear bilaterally to auscultation without wheezes, rales, or rhonchi. Breathing is unlabored. Heart: RRR with S1 S2. No   murmur . No rubs, or gallops appreciated. Abdomen: Soft, non-tender, non-distended with normoactive bowel sounds. No hepatomegaly. No rebound/guarding. No obvious abdominal masses. Msk:  Strength and tone appear normal for age. Extremities: No clubbing or cyanosis. Left greater than right 1+ edema.  Distal pedal pulses are 2+ and equal bilaterally. Skin: Warm and Dry Neuro: Alert and oriented X 3. CN III-XII intact Grossly normal sensory and motor function . Psych:  Responds to questions  appropriately with a normal affect.      Labs: Cardiac Enzymes No results for input(s): CKTOTAL, CKMB, TROPONINI in the last 72 hours. CBC Lab Results  Component Value Date   WBC 5.8 04/11/2016   HGB 12.8 04/11/2016   HCT 40.3 04/11/2016   MCV 77.6* 04/11/2016   PLT 226 04/11/2016   PROTIME: No results for input(s): LABPROT, INR in the last 72 hours. Chemistry No results for input(s): NA, K, CL, CO2, BUN, CREATININE, CALCIUM, PROT, BILITOT, ALKPHOS, ALT, AST, GLUCOSE in the last 168 hours.  Invalid input(s): LABALBU Lipids Lab Results  Component Value Date   CHOL 142 04/15/2014   HDL 41.90 04/15/2014   LDLCALC 86 04/15/2014   TRIG 73.0 04/15/2014   BNP No results found for: PROBNP Thyroid Function Tests: No results for input(s): TSH, T4TOTAL, T3FREE,  THYROIDAB in the last 72 hours.  Invalid input(s): FREET3 Miscellaneous No results found for: DDIMER  Radiology/Studies:  No results found.  EKG: sinus at 62 Monday 6/27  AFib 64   Assessment and Plan:  Atrial fibrillation-paroxysmal  Fatigue/dyspnea on exertion  Sleep disordered breathing  Hypertension  HFpEF    The patient has paroxysmal atrial fibrillation with frequent changes in her rhythm. Her atrial fibrillation rate initially fast is now much slower. I wonder if bradycardia is a contributing to her symptoms. Her heart rates recorded at home range of to 105 so it is hard to know. In any case, we will stop her digoxin given the fact that her heart muscle function is normal. Her metoprolol as the amiodarone continues to accumulate at 200 twice a day.  We will plan to reevaluate her in a month. I suggested that she get a pulse oximeter to try to help [place] clarify whether her more rapid rates are regular irregular. Hopefully as the amiodarone accumulates she will hold sinus rhythm. She may develop chronotropic incompetence as suggested by her resting bradycardia.  In the event that her symptoms do not abate  with the discontinuation of the aforementioned medications, we will have to assess the question about amiodarone as well as chronotropic competence a lot of stress testing.  She is modestly volume overloaded with asymmetric edema. Given the fact that she is on anticoagulation will not pursue venous Dopplers. We will have her use her Lasix twice a week.  She also has significant sleep disordered breathing and daytime somnolence. Management of her atrial fibrillation will be significantly facilitated by sleep testing and therapy if indicated. I will ask her PCP to pursue this      Virl Axe

## 2016-05-09 NOTE — Patient Instructions (Signed)
Medication Instructions: - Your physician has recommended you make the following change in your medication:  1) Stop digoxin 2) Stop metoprolol  Labwork: - none  Procedures/Testing: - none  Follow-Up: - Your physician recommends that you schedule a follow-up appointment in: 1 month with Dr. [name].  Any Additional Special Instructions Will Be Listed Below (If Applicable). - Dr. [name] will speak with your PCP about setting up a sleep study    If you need a refill on your cardiac medications before your next appointment, please call your pharmacy.

## 2016-05-09 NOTE — Telephone Encounter (Signed)
The patient's cardiologist wants her to have a sleep study for sleep disordered breathing and daytime somnolence  I am ordering a ref to the sleep clinic-please let her know

## 2016-05-10 NOTE — Telephone Encounter (Signed)
Spoke to pt and informed her of request. Advised pt to await a call with appt details

## 2016-05-17 ENCOUNTER — Encounter: Payer: Self-pay | Admitting: Cardiovascular Disease

## 2016-05-21 ENCOUNTER — Other Ambulatory Visit: Payer: Self-pay

## 2016-05-21 MED ORDER — AMIODARONE HCL 200 MG PO TABS: 200.0000 mg | ORAL_TABLET | Freq: Two times a day (BID) | ORAL | Status: DC

## 2016-05-21 NOTE — Telephone Encounter (Signed)
Refill sent for Amiodarone 200 mg take one tablet twice a day.

## 2016-05-24 ENCOUNTER — Other Ambulatory Visit: Payer: Self-pay

## 2016-05-24 MED ORDER — AMIODARONE HCL 200 MG PO TABS: 200.0000 mg | ORAL_TABLET | Freq: Two times a day (BID) | ORAL | Status: DC

## 2016-05-24 NOTE — Telephone Encounter (Signed)
PT states dr. [name] states the 200mg  should be doubled. Please call.

## 2016-05-28 ENCOUNTER — Ambulatory Visit (INDEPENDENT_AMBULATORY_CARE_PROVIDER_SITE_OTHER): Payer: PPO | Admitting: Internal Medicine

## 2016-05-28 ENCOUNTER — Encounter: Payer: Self-pay | Admitting: Internal Medicine

## 2016-05-28 VITALS — BP 150/88 | HR 87 | Ht 60.0 in | Wt 193.0 lb

## 2016-05-28 DIAGNOSIS — G4710 Hypersomnia, unspecified: Secondary | ICD-10-CM | POA: Diagnosis not present

## 2016-05-28 DIAGNOSIS — G479 Sleep disorder, unspecified: Secondary | ICD-10-CM | POA: Diagnosis not present

## 2016-05-28 DIAGNOSIS — E669 Obesity, unspecified: Secondary | ICD-10-CM | POA: Diagnosis not present

## 2016-05-28 DIAGNOSIS — R0609 Other forms of dyspnea: Secondary | ICD-10-CM | POA: Diagnosis not present

## 2016-05-28 DIAGNOSIS — R400 Somnolence: Secondary | ICD-10-CM

## 2016-05-28 DIAGNOSIS — R0600 Dyspnea, unspecified: Secondary | ICD-10-CM | POA: Insufficient documentation

## 2016-05-28 NOTE — Assessment & Plan Note (Addendum)
Patient with risk factors for obstructive sleep apnea, see below. Snoring, Tired, HTN, Obesity, Age. ESS = 9  Plan - NPSG

## 2016-05-28 NOTE — Assessment & Plan Note (Signed)
Possibly due to sleep apnea, atrial fibrillation, obesity. She does have stiff can risk factors for obstructive sleep apnea, see the plan for sleep disturbance.  Plan: NPSG Exercise, diet, weight loss

## 2016-05-28 NOTE — Progress Notes (Signed)
[facility] Consultation    Date: 05/28/2016  MRN# [id number] Kerry Simpson December 23, 1945  Referring Physician: Dr. Glori Bickers PMD - Dr. Tomi Simpson is a 70 y.o. old female seen in consultation for OSA evaluation  CC:  Chief Complaint  Patient presents with  . sleep consult    pt ref by Dr.Tower. pt c/o daytime sleepiness. EPWORTH: 9     HPI:  Patient is a pleasant 70 year old female past medical history of obesity, iron deficiency anemia, hypertension, osteoporosis, paroxysmal atrial fibrillation. She will follow closely by cardiology, has been started on amiodarone and previously was on digoxin. Review of records showed that she had a cardioversion performed however reverted back into atrial fibrillation. Currently she is in sinus rhythm and all managed with amiodarone. Her cardiologist recommended that she does have significant daytime somnolence along with fatigue, and untreated sleep apnea may be adding to her underlying tachyarrhythmia. Patient does admit today to snoring, morning fatigue, hypertension, obesity, advanced age. Her sleep hygiene is as follows, usually goes to bed about 9 PM gets about 910 hours of sleep, but still wakes up tired. She denies any shortness of breath or cough. She does endorse dyspnea on exertion, can walk about 100 yards at slow pace before taking a break.   Obstructive Sleep Apnea Screening The patient was screened with the STOP-BANG questionnaire. >3 positive responses is considered a positive screen  Snoring  YES Tiredness  YES Observed Apnea unsure Pressure (HTN) YES BMI >35  YES Age > 50  YES Neck >17"  NO Gender (female) NO  Total: 5/8  Screen: Positive for NPSG.     PMHX:   Past Medical History  Diagnosis Date  . Grave's disease   . Anemia     iron def  . Obesity   . Hypertension   . Fibrocystic breast   . Osteoporosis     osteopenia  . Fracture of right shoulder   . Complication of anesthesia    "quit breathing" -Endoscopy( 12-22-14)  . History of kidney stones     not a problem at present   Surgical Hx:  Past Surgical History  Procedure Laterality Date  . Tubal ligation    . Colonoscopy  09/29/2014    Normal, Delfin Edis, MD  . Esophagogastroduodenoscopy endoscopy    . Eus N/A 01/12/2015    Procedure: UPPER ENDOSCOPIC ULTRASOUND (EUS) LINEAR;  Surgeon: Milus Banister, MD;  Location: [facility];  Service: Endoscopy;  Laterality: N/A;  . Electrophysiologic study N/A 04/12/2016    Procedure: CARDIOVERSION;  Surgeon: Minna Merritts, MD;  Location: [facility];  Service: Cardiovascular;  Laterality: N/A;   Family Hx:  Family History  Problem Relation Age of Onset  . Hypertension Mother   . Diabetes Father   . Hypertension Father   . Hypertension Brother   . Cancer Brother     prostate  . Heart disease Brother     with bypass  . Colon cancer Neg Hx    Social Hx:   Social History  Substance Use Topics  . Smoking status: Never Smoker   . Smokeless tobacco: Never Used  . Alcohol Use: No   Medication:   Current Outpatient Rx  Name  Route  Sig  Dispense  Refill  . acetaminophen (TYLENOL) 500 MG tablet   Oral   Take 500 mg by mouth every 6 (six) hours as needed.         Marland Kitchen amiodarone (PACERONE) 200 MG tablet  Oral   Take 1 tablet (200 mg total) by mouth 2 (two) times daily.   60 tablet   3   . amLODipine (NORVASC) 5 MG tablet   Oral   Take 1 tablet (5 mg total) by mouth daily.   30 tablet   11   . Cholecalciferol (VITAMIN D-3) 1000 UNITS CAPS   Oral   Take 1 capsule by mouth daily.         . cyanocobalamin 1000 MCG tablet   Oral   Take 1,000 mcg by mouth daily.         . fosinopril (MONOPRIL) 20 MG tablet   Oral   Take 1 tablet (20 mg total) by mouth daily.   90 tablet   3   . furosemide (LASIX) 20 MG tablet   Oral   Take 1 tablet (20 mg total) by mouth daily as needed.   30 tablet   3   . Omega-3 Fatty Acids (FISH OIL PO)   Oral   Take by  mouth daily.         Marland Kitchen omeprazole (PRILOSEC) 20 MG capsule      TAKE 1 CAPSULE BY MOUTH ONCE DAILY   30 capsule   10   . PARoxetine (PAXIL) 10 MG tablet      TAKE 1 TABLET BY MOUTH ONCE EVERY EVENING   90 tablet   3   . Polyethyl Glycol-Propyl Glycol (SYSTANE OP)   Ophthalmic   Apply to eye as needed.         . rivaroxaban (XARELTO) 20 MG TABS tablet   Oral   Take 1 tablet (20 mg total) by mouth daily with supper.   30 tablet   6       Allergies:  Review of patient's allergies indicates no known allergies.  Review of Systems  Constitutional: Positive for malaise/fatigue. Negative for fever and chills.  Eyes: Negative for blurred vision.  Respiratory: Positive for shortness of breath. Negative for cough and wheezing.   Cardiovascular: Negative for chest pain and palpitations.  Gastrointestinal: Negative for heartburn.  Genitourinary: Negative for dysuria.  Musculoskeletal: Negative for myalgias.  Skin: Negative for rash.  Neurological: Negative for headaches.  Endo/Heme/Allergies: Does not bruise/bleed easily.  Psychiatric/Behavioral: Negative for depression.     Physical Examination:   VS: BP 150/88 mmHg  Pulse 87  Ht 5' (1.524 m)  Wt 193 lb (87.544 kg)  BMI 37.69 kg/m2  SpO2 93%  General Appearance: No distress  Neuro:without focal findings, mental status, speech normal, alert and oriented, cranial nerves 2-12 intact, reflexes normal and symmetric, sensation grossly normal  HEENT: PERRLA, EOM intact, no ptosis, no other lesions noticed; Mallampati 3 Pulmonary: normal breath sounds., diaphragmatic excursion normal.No wheezing, No rales;   Sputum Production:  none CardiovascularNormal S1,S2.  No m/r/g.  Abdominal aorta pulsation normal.    Abdomen: Benign, Soft, non-tender, No masses, hepatosplenomegaly, No lymphadenopathy Renal:  No costovertebral tenderness  GU:  No performed at this time. Endoc: No evident thyromegaly, no signs of acromegaly or  Cushing features Skin:   warm, no rashes, no ecchymosis  Extremities: normal, no cyanosis, clubbing, trace B\L LE edema, warm with normal capillary refill. Other findings:none   Assessment and Plan: 70 year old female past medical history of paroxysmal atrial fibrillation on amiodarone, obesity, hypertension, seen for obstructive sleep apnea evaluation. Sleep disturbance Patient with risk factors for obstructive sleep apnea, see below. Snoring, Tired, HTN, Obesity, Age. ESS = 9  Plan - NPSG  DOE (dyspnea on exertion) Multifactorial: Suspected sleep apnea, obesity, deconditioning, fatigue Patient is also noted to be on a moderate dose of amiodarone for her proximal A. fib. Given a dose of this medication will get baseline pulmonary function testing also.  Plan: -Pulmonary function tests, 6 minute walk test -Diet, exercise, weight loss   Somnolence, daytime Possibly due to sleep apnea, atrial fibrillation, obesity. She does have stiff can risk factors for obstructive sleep apnea, see the plan for sleep disturbance.  Plan: NPSG Exercise, diet, weight loss   OBESITY OBESITY  Discussed importance of weight reduction.  Educated regarding limitation of  intake of greasy/fried foods.  Instructed on benefit of  a low-impact exercise program, starting slowly.  Discussed benefits of 30-45 minutes of some form of exercise daily as well as benefit of supervised exercise program.       Updated Medication List Outpatient Encounter Prescriptions as of 05/28/2016  Medication Sig  . acetaminophen (TYLENOL) 500 MG tablet Take 500 mg by mouth every 6 (six) hours as needed.  Marland Kitchen amiodarone (PACERONE) 200 MG tablet Take 1 tablet (200 mg total) by mouth 2 (two) times daily.  Marland Kitchen amLODipine (NORVASC) 5 MG tablet Take 1 tablet (5 mg total) by mouth daily.  . Cholecalciferol (VITAMIN D-3) 1000 UNITS CAPS Take 1 capsule by mouth daily.  . cyanocobalamin 1000 MCG tablet Take 1,000 mcg by mouth daily.   . fosinopril (MONOPRIL) 20 MG tablet Take 1 tablet (20 mg total) by mouth daily.  . furosemide (LASIX) 20 MG tablet Take 1 tablet (20 mg total) by mouth daily as needed.  . Omega-3 Fatty Acids (FISH OIL PO) Take by mouth daily.  Marland Kitchen omeprazole (PRILOSEC) 20 MG capsule TAKE 1 CAPSULE BY MOUTH ONCE DAILY  . PARoxetine (PAXIL) 10 MG tablet TAKE 1 TABLET BY MOUTH ONCE EVERY EVENING  . Polyethyl Glycol-Propyl Glycol (SYSTANE OP) Apply to eye as needed.  . rivaroxaban (XARELTO) 20 MG TABS tablet Take 1 tablet (20 mg total) by mouth daily with supper.   No facility-administered encounter medications on file as of 05/28/2016.    Orders for this visit: Orders Placed This Encounter  Procedures  . Pulmonary function test    Standing Status: Future     Number of Occurrences:      Standing Expiration Date: 05/28/2017    Order Specific Question:  Where should this test be performed?    Answer:  [facility]    Order Specific Question:  Full PFT: includes the following: basic spirometry, spirometry pre & post bronchodilator, diffusion capacity (DLCO), lung volumes    Answer:  Full PFT  . 6 minute walk    Standing Status: Future     Number of Occurrences:      Standing Expiration Date: 05/28/2017    Order Specific Question:  Where should this test be performed?    Answer:  Other  . Polysomnography 4 or more parameters    Standing Status: Future     Number of Occurrences:      Standing Expiration Date: 05/28/2017    Order Specific Question:  Where should this test be performed:    Answer:  Other     Thank  you for the consultation and for allowing [facility] to assist in the care of your patient. Our recommendations are noted above.  Please contact us if we can be of further service.   Vilinda Boehringer, MD [facility] Office Number: [phone]  Note:  This note was prepared with Dragon dictation along with smaller phrase technology. Any  transcriptional errors that result from this process are unintentional.

## 2016-05-28 NOTE — Patient Instructions (Signed)
Follow up with Dr. Stevenson Clinch in:1 month - PFTs and 6 MWT  - Sleep study - NPSG - diet, exercise and weight loss

## 2016-05-28 NOTE — Assessment & Plan Note (Signed)
OBESITY  Discussed importance of weight reduction.  Educated regarding limitation of  intake of greasy/fried foods.  Instructed on benefit of  a low-impact exercise program, starting slowly.  Discussed benefits of 30-45 minutes of some form of exercise daily as well as benefit of supervised exercise program.    

## 2016-05-28 NOTE — Assessment & Plan Note (Signed)
Multifactorial: Suspected sleep apnea, obesity, deconditioning, fatigue Patient is also noted to be on a moderate dose of amiodarone for her proximal A. fib. Given a dose of this medication will get baseline pulmonary function testing also.  Plan: -Pulmonary function tests, 6 minute walk test -Diet, exercise, weight loss

## 2016-06-26 ENCOUNTER — Telehealth: Payer: Self-pay | Admitting: *Deleted

## 2016-06-26 ENCOUNTER — Ambulatory Visit (INDEPENDENT_AMBULATORY_CARE_PROVIDER_SITE_OTHER): Payer: PPO | Admitting: *Deleted

## 2016-06-26 DIAGNOSIS — R0600 Dyspnea, unspecified: Secondary | ICD-10-CM

## 2016-06-26 DIAGNOSIS — R0609 Other forms of dyspnea: Secondary | ICD-10-CM | POA: Diagnosis not present

## 2016-06-26 LAB — PULMONARY FUNCTION TEST
FEF 25-75 PRE: 2.02 L/s
FEF2575-%PRED-PRE: 124 %
FEV1-%Pred-Pre: 92 %
FEV1-Pre: 1.73 L
FEV1FVC-%Pred-Pre: 112 %
FEV6-%PRED-PRE: 85 %
FEV6-Pre: 2.02 L
FEV6FVC-%PRED-PRE: 105 %
FVC-%PRED-PRE: 81 %
FVC-PRE: 2.02 L
PRE FEV6/FVC RATIO: 100 %
Pre FEV1/FVC ratio: 86 %

## 2016-06-26 NOTE — Progress Notes (Signed)
SMW performed today. 

## 2016-06-26 NOTE — Telephone Encounter (Signed)
Pt came in for SMW and PFT. Pt done SMW and tried pre FVC on PFT with little effort. Pt stated she couldn't do PFT and left. VM informed of pt not doing the PFT and states for pt to do office spiro at f/u appt, nothing further needed.

## 2016-06-27 ENCOUNTER — Encounter: Payer: Self-pay | Admitting: Internal Medicine

## 2016-06-27 ENCOUNTER — Ambulatory Visit (INDEPENDENT_AMBULATORY_CARE_PROVIDER_SITE_OTHER): Payer: PPO | Admitting: Internal Medicine

## 2016-06-27 VITALS — BP 130/80 | HR 79 | Ht 60.0 in | Wt 191.2 lb

## 2016-06-27 DIAGNOSIS — I480 Paroxysmal atrial fibrillation: Secondary | ICD-10-CM

## 2016-06-27 DIAGNOSIS — I4891 Unspecified atrial fibrillation: Secondary | ICD-10-CM | POA: Diagnosis not present

## 2016-06-27 DIAGNOSIS — I10 Essential (primary) hypertension: Secondary | ICD-10-CM

## 2016-06-27 DIAGNOSIS — I4892 Unspecified atrial flutter: Secondary | ICD-10-CM

## 2016-06-27 DIAGNOSIS — Z79899 Other long term (current) drug therapy: Secondary | ICD-10-CM | POA: Diagnosis not present

## 2016-06-27 DIAGNOSIS — I5030 Unspecified diastolic (congestive) heart failure: Secondary | ICD-10-CM

## 2016-06-27 MED ORDER — AMIODARONE HCL 200 MG PO TABS: ORAL_TABLET | ORAL | Status: DC

## 2016-06-27 NOTE — Progress Notes (Signed)
Patient Care Team: Abner Greenspan, MD as PCP - Eagle, MD as Consulting Physician (Family Medicine) Robert Bellow, MD (General Surgery) Minna Merritts, MD as Consulting Physician (Cardiology)   HPI  Kerry Simpson is a 70 y.o. female Seen in follow-up for atrial fibrillation-persistent for which she is taking amiodarone, occurring in the context of some degree of bradycardia. She also has sleep disordered breathing and evidence of HFpEF. At the initial visit, it was recommended that she have a sleep study, low grade diuresis was initiated, digoxin and metoprolol were discontinued because of concerns of bradycardia. It was not clear whether her symptoms of exercise intolerance related to bradycardia or atrial arrhythmia.    She has some dyspnea on exertion but is "much much much much better". She denies nausea, cough, sun sensitivity. Efforts at PFTs were unsuccessful.   Records and Results Reviewed pulm notes   Past Medical History:  Diagnosis Date  . Anemia    iron def  . Complication of anesthesia    "quit breathing" -Endoscopy( 12-22-14)  . Fibrocystic breast   . Fracture of right shoulder   . Grave's disease   . History of kidney stones    not a problem at present  . Hypertension   . Obesity   . Osteoporosis    osteopenia    Past Surgical History:  Procedure Laterality Date  . COLONOSCOPY  09/29/2014   Normal, Delfin Edis, MD  . ELECTROPHYSIOLOGIC STUDY N/A 04/12/2016   Procedure: CARDIOVERSION;  Surgeon: Minna Merritts, MD;  Location: [facility] ORS;  Service: Cardiovascular;  Laterality: N/A;  . ESOPHAGOGASTRODUODENOSCOPY ENDOSCOPY    . EUS N/A 01/12/2015   Procedure: UPPER ENDOSCOPIC ULTRASOUND (EUS) LINEAR;  Surgeon: Milus Banister, MD;  Location: [facility];  Service: Endoscopy;  Laterality: N/A;  . TUBAL LIGATION      Current Outpatient Prescriptions  Medication Sig Dispense Refill  . acetaminophen (TYLENOL) 500 MG tablet Take 500 mg  by mouth every 6 (six) hours as needed.    Marland Kitchen amiodarone (PACERONE) 200 MG tablet Take 1 tablet (200 mg total) by mouth 2 (two) times daily. 60 tablet 3  . amLODipine (NORVASC) 5 MG tablet Take 1 tablet (5 mg total) by mouth daily. 30 tablet 11  . Cholecalciferol (VITAMIN D-3) 1000 UNITS CAPS Take 1 capsule by mouth daily.    . cyanocobalamin 1000 MCG tablet Take 1,000 mcg by mouth daily.    . fosinopril (MONOPRIL) 20 MG tablet Take 1 tablet (20 mg total) by mouth daily. 90 tablet 3  . furosemide (LASIX) 20 MG tablet Take 1 tablet (20 mg total) by mouth daily as needed. 30 tablet 3  . Omega-3 Fatty Acids (FISH OIL PO) Take by mouth daily.    Marland Kitchen omeprazole (PRILOSEC) 20 MG capsule TAKE 1 CAPSULE BY MOUTH ONCE DAILY 30 capsule 10  . PARoxetine (PAXIL) 10 MG tablet TAKE 1 TABLET BY MOUTH ONCE EVERY EVENING 90 tablet 3  . Polyethyl Glycol-Propyl Glycol (SYSTANE OP) Apply to eye as needed.    . rivaroxaban (XARELTO) 20 MG TABS tablet Take 1 tablet (20 mg total) by mouth daily with supper. 30 tablet 6   No current facility-administered medications for this visit.     No Known Allergies    Review of Systems negative except from HPI and PMH  Physical Exam Ht 5' (1.524 m)   Wt 191 lb 4 oz (86.8 kg)   BMI 37.35 kg/m  Well developed and well nourished in no acute distress HENT normal E scleral and icterus clear Neck Supple JVP flat; carotids brisk and full Clear to ausculation Regular rate and rhythm, no murmurs gallops or rub Soft with active bowel sounds No clubbing cyanosis  Edema Alert and oriented, grossly normal motor and sensory function Skin Warm and Dry  ECG demonstrates rhythm at 79 Intervals 19/09/41 Axis left -47 Poor R wave progression consistent with an anteroseptal MI  Assessment and  Plan  Atrial fibrillation-paroxysmal  Fatigue/dyspnea on exertion  Sleep disordered breathing  Hypertension  High Risk Medication Surveillance  HFpEF   She is much  improved; we will decrease her amiodarone from 400-I-200 daily notwithstanding infrequent episodes of breakthrough; we will check her amiodarone surveillance laboratories today  I've encouraged her to follow up with her sleep study based on the data of improved arrhythmia control with sleep apnea therapy  Blood pressure is reasonably controlled  She is euvolemic

## 2016-06-27 NOTE — Patient Instructions (Signed)
Medication Instructions: - Your physician has recommended you make the following change in your medication:  1) Decrease amiodarone to 200 mg one tablet by mouth once daily  Labwork: - Your physician recommends that you have lab work today: Liver/ TSH  Procedures/Testing: - none  Follow-Up: - Your physician recommends that you schedule a follow-up appointment in: 3 months with Dr. Rockey Situ  Any Additional Special Instructions Will Be Listed Below (If Applicable).     If you need a refill on your cardiac medications before your next appointment, please call your pharmacy.

## 2016-06-28 LAB — HEPATIC FUNCTION PANEL
ALBUMIN: 4.5 g/dL (ref 3.5–4.8)
ALT: 24 IU/L (ref 0–32)
AST: 27 IU/L (ref 0–40)
Alkaline Phosphatase: 91 IU/L (ref 39–117)
Bilirubin Total: 0.3 mg/dL (ref 0.0–1.2)
Bilirubin, Direct: 0.09 mg/dL (ref 0.00–0.40)
Total Protein: 7.2 g/dL (ref 6.0–8.5)

## 2016-06-28 LAB — TSH: TSH: 2.69 u[IU]/mL (ref 0.450–4.500)

## 2016-07-01 ENCOUNTER — Telehealth: Payer: Self-pay | Admitting: Internal Medicine

## 2016-07-01 NOTE — Telephone Encounter (Signed)
Pt states she would like to cancel her sleep study that's scheduled for 07/03/16 due to not wanting to do it right now. Pt has also canceled her f/u appt with VM for 07/09/16. Pt given sleepmed's number to call and cancel scheduled appt.  Will route to MV for a fyi. Nothing further needed.

## 2016-07-01 NOTE — Telephone Encounter (Signed)
LMOVM Will await call back.  

## 2016-07-01 NOTE — Telephone Encounter (Signed)
Pt would like to cancel her sleep study test

## 2016-07-05 ENCOUNTER — Telehealth: Payer: Self-pay | Admitting: Internal Medicine

## 2016-07-05 NOTE — Telephone Encounter (Signed)
I called and spoke with the patient. She is aware of her lab results.

## 2016-07-05 NOTE — Telephone Encounter (Signed)
Pt would like lab results.  

## 2016-07-09 ENCOUNTER — Ambulatory Visit: Payer: PPO | Admitting: Internal Medicine

## 2016-07-16 ENCOUNTER — Other Ambulatory Visit: Payer: Self-pay | Admitting: Family Medicine

## 2016-07-17 NOTE — Telephone Encounter (Signed)
Please schedule a late fall 30 min f/u and refill until then

## 2016-07-17 NOTE — Telephone Encounter (Signed)
Left voicemail requesting pt to call the office back 

## 2016-07-17 NOTE — Telephone Encounter (Signed)
No recent/future appts, last filled on 07/13/15 #90 with 3 additional refills, please advise

## 2016-07-18 NOTE — Telephone Encounter (Signed)
Patient returned Shapale's call. °

## 2016-07-18 NOTE — Telephone Encounter (Signed)
appt scheduled and med refill 

## 2016-07-19 ENCOUNTER — Encounter: Payer: Self-pay | Admitting: Internal Medicine

## 2016-08-12 ENCOUNTER — Telehealth: Payer: Self-pay | Admitting: Internal Medicine

## 2016-08-12 NOTE — Telephone Encounter (Signed)
Pt informed of response. Nothing further needed. 

## 2016-08-12 NOTE — Telephone Encounter (Signed)
Pt requests if she can get the results of her 6 min walk  . Please call.

## 2016-08-12 NOTE — Telephone Encounter (Signed)
66minute . walk test results: Total distance 264 m/866 feet Low saturation 93% Highest heart rate 83 Impression: No need for supplemental oxygen with walking, mild deconditioning noted by total distance of 866 feet, would prefer 1000 feet.

## 2016-08-12 NOTE — Telephone Encounter (Signed)
Is calling wanting results of her SMW test. She cancelled the f/u appt with you she had on 07/09/16 and no f/u has been scheduled. Please advise on the SMW results. Results are in your folder.

## 2016-08-20 ENCOUNTER — Encounter: Payer: Self-pay | Admitting: Family Medicine

## 2016-08-20 ENCOUNTER — Ambulatory Visit (INDEPENDENT_AMBULATORY_CARE_PROVIDER_SITE_OTHER): Payer: PPO | Admitting: Family Medicine

## 2016-08-20 VITALS — BP 132/78 | HR 77 | Temp 98.1°F | Ht 60.0 in | Wt 194.2 lb

## 2016-08-20 DIAGNOSIS — R7309 Other abnormal glucose: Secondary | ICD-10-CM | POA: Diagnosis not present

## 2016-08-20 DIAGNOSIS — K219 Gastro-esophageal reflux disease without esophagitis: Secondary | ICD-10-CM

## 2016-08-20 DIAGNOSIS — I4891 Unspecified atrial fibrillation: Secondary | ICD-10-CM

## 2016-08-20 DIAGNOSIS — R4589 Other symptoms and signs involving emotional state: Secondary | ICD-10-CM

## 2016-08-20 DIAGNOSIS — E6609 Other obesity due to excess calories: Secondary | ICD-10-CM | POA: Diagnosis not present

## 2016-08-20 DIAGNOSIS — I10 Essential (primary) hypertension: Secondary | ICD-10-CM

## 2016-08-20 DIAGNOSIS — I4892 Unspecified atrial flutter: Secondary | ICD-10-CM

## 2016-08-20 DIAGNOSIS — R400 Somnolence: Secondary | ICD-10-CM

## 2016-08-20 DIAGNOSIS — Z23 Encounter for immunization: Secondary | ICD-10-CM

## 2016-08-20 DIAGNOSIS — Z6837 Body mass index (BMI) 37.0-37.9, adult: Secondary | ICD-10-CM

## 2016-08-20 LAB — LIPID PANEL
CHOLESTEROL: 167 mg/dL (ref 0–200)
HDL: 49 mg/dL (ref >39.00)
LDL Cholesterol: 101 mg/dL — ABNORMAL HIGH (ref 0–99)
NONHDL: 118.07
TRIGLYCERIDES: 87 mg/dL (ref 0.0–149.0)
Total CHOL/HDL Ratio: 3
VLDL: 17.4 mg/dL (ref 0.0–40.0)

## 2016-08-20 LAB — HEMOGLOBIN A1C: Hgb A1c MFr Bld: 6.1 % (ref 4.6–6.5)

## 2016-08-20 MED ORDER — FOSINOPRIL SODIUM 20 MG PO TABS: 20.0000 mg | ORAL_TABLET | Freq: Every day | ORAL | 3 refills | Status: DC

## 2016-08-20 MED ORDER — OMEPRAZOLE 20 MG PO CPDR: 20.0000 mg | DELAYED_RELEASE_CAPSULE | Freq: Every day | ORAL | 3 refills | Status: DC

## 2016-08-20 MED ORDER — PAROXETINE HCL 10 MG PO TABS: 10.0000 mg | ORAL_TABLET | Freq: Every day | ORAL | 3 refills | Status: DC

## 2016-08-20 NOTE — Assessment & Plan Note (Signed)
Unfortunately pt cancelled her sleep study

## 2016-08-20 NOTE — Assessment & Plan Note (Signed)
bp in fair control at this time  BP Readings from Last 1 Encounters:  08/20/16 132/78   No changes needed Disc lifstyle change with low sodium diet and exercise  Continues cardiology f/u  Labs reviewed from June Lipid panel today

## 2016-08-20 NOTE — Assessment & Plan Note (Signed)
Omeprazole is very helpful -pt is unable to come off of it right now  Disc diet for GERD

## 2016-08-20 NOTE — Progress Notes (Signed)
Pre visit review using our clinic review tool, if applicable. No additional management support is needed unless otherwise documented below in the visit note. 

## 2016-08-20 NOTE — Assessment & Plan Note (Signed)
A1C today At risk of DM2 Disc need for wt loss and low glycemic diet  She is not motivated

## 2016-08-20 NOTE — Assessment & Plan Note (Signed)
Discussed how this problem influences overall health and the risks it imposes  Reviewed plan for weight loss with lower calorie diet (via better food choices and also portion control or program like weight watchers) and exercise building up to or more than 30 minutes 5 days per week including some aerobic activity   Will start with short walks and gradually increase duration She is not motivated to change diet at all - has emotional eating habit and likes carbs  Declines counselor at this time

## 2016-08-20 NOTE — Progress Notes (Signed)
Subjective:    Patient ID: Kerry Simpson, female    DOB: [birth year], 70 y.o.   MRN: [id number]  HPI Here for f/u of chronic health problems   Has been feeling much better overall   Wt Readings from Last 3 Encounters:  08/20/16 194 lb 4 oz (88.1 kg)  06/27/16 191 lb 4 oz (86.8 kg)  05/28/16 193 lb (87.5 kg)  bmi is 37.9 Not eating well Not getting enough exercise - although her energy is coming back but not full force   She is a stress eater for a long time  Declines counseling for this   Hyperglycemia Lab Results  Component Value Date   HGBA1C 6.2 02/14/2016   She eats lots of carbs and sweets  Does not think she can stop   bp is stable today  No cp or palpitations or headaches or edema  No side effects to medicines  BP Readings from Last 3 Encounters:  08/20/16 132/78  06/27/16 130/80  05/28/16 (!) 150/88      She is taking amlodipine and fosinopril and lasix  For afib/f;utter she is on xarelto and amiodarone  She states this is under control  Tolerating the amiodarone  Now her sob is much better as well    (had a w/u incl PFT and a walking test) She cancelled her sleep study Never came up with a reason why    Takes omeprazole for GERD- still takes that and it helps tremendously  Cannot miss a day of this   Paxil for mood -works very well  Stress is much improved    Getting flu shot today  Patient Active Problem List   Diagnosis Date Noted  . GERD (gastroesophageal reflux disease) 08/20/2016  . DOE (dyspnea on exertion) 05/28/2016  . Sleep disturbance 05/28/2016  . Somnolence, daytime 05/09/2016  . Encounter for anticoagulation discussion and counseling 02/15/2016  . Tachycardia 02/14/2016  . Atrial fibrillation and flutter ([place]) 02/14/2016  . Routine general medical examination at a health care facility 10/13/2015  . Estrogen deficiency 10/13/2015  . Fatigue 08/25/2015  . History of melena 12/12/2014  . Gallstone 12/07/2014  . RUQ pain  12/05/2014  . Encounter for routine gynecological examination 04/14/2013  . Encounter for Medicare annual wellness exam 03/30/2013  . Other screening mammogram 01/08/2012  . STRESS REACTION, ACUTE, WITH EMOTIONAL DISTURBANCE 11/26/2010  . HYPERGLYCEMIA 09/28/2010  . Osteopenia 06/21/2009  . HYPERTHYROIDISM 04/26/2008  . Obesity 04/26/2008  . Essential hypertension 04/26/2008   Past Medical History:  Diagnosis Date  . Anemia    iron def  . Complication of anesthesia    "quit breathing" -Endoscopy( 12-22-14)  . Fibrocystic breast   . Fracture of right shoulder   . Grave's disease   . History of kidney stones    not a problem at present  . Hypertension   . Obesity   . Osteoporosis    osteopenia   Past Surgical History:  Procedure Laterality Date  . COLONOSCOPY  09/29/2014   Normal, Delfin Edis, MD  . ELECTROPHYSIOLOGIC STUDY N/A 04/12/2016   Procedure: CARDIOVERSION;  Surgeon: Minna Merritts, MD;  Location: [facility] ORS;  Service: Cardiovascular;  Laterality: N/A;  . ESOPHAGOGASTRODUODENOSCOPY ENDOSCOPY    . EUS N/A 01/12/2015   Procedure: UPPER ENDOSCOPIC ULTRASOUND (EUS) LINEAR;  Surgeon: Milus Banister, MD;  Location: [facility];  Service: Endoscopy;  Laterality: N/A;  . TUBAL LIGATION     Social History  Substance Use Topics  . Smoking status:  Never Smoker  . Smokeless tobacco: Never Used  . Alcohol use No   Family History  Problem Relation Age of Onset  . Hypertension Mother   . Diabetes Father   . Hypertension Father   . Hypertension Brother   . Cancer Brother     prostate  . Heart disease Brother     with bypass  . Colon cancer Neg Hx    No Known Allergies Current Outpatient Prescriptions on File Prior to Visit  Medication Sig Dispense Refill  . acetaminophen (TYLENOL) 500 MG tablet Take 500 mg by mouth every 6 (six) hours as needed.    Marland Kitchen amiodarone (PACERONE) 200 MG tablet Take one tablet (200 mg) by mouth once daily    . amLODipine (NORVASC) 5 MG tablet  Take 1 tablet (5 mg total) by mouth daily. 30 tablet 11  . Cholecalciferol (VITAMIN D-3) 1000 UNITS CAPS Take 1 capsule by mouth daily.    . cyanocobalamin 1000 MCG tablet Take 1,000 mcg by mouth daily.    . furosemide (LASIX) 20 MG tablet Take 1 tablet (20 mg total) by mouth daily as needed. 30 tablet 3  . Omega-3 Fatty Acids (FISH OIL PO) Take by mouth daily.    Vladimir Faster Glycol-Propyl Glycol (SYSTANE OP) Apply to eye as needed.    . rivaroxaban (XARELTO) 20 MG TABS tablet Take 1 tablet (20 mg total) by mouth daily with supper. 30 tablet 6   No current facility-administered medications on file prior to visit.     Review of Systems Review of Systems  Constitutional: Negative for fever, appetite change, fatigue and unexpected weight change.  Eyes: Negative for pain and visual disturbance.  Respiratory: Negative for cough and shortness of breath.   Cardiovascular: Negative for cp or palpitations    Gastrointestinal: Negative for nausea, diarrhea and constipation.  Genitourinary: Negative for urgency and frequency.  Skin: Negative for pallor or rash   Neurological: Negative for weakness, light-headedness, numbness and headaches.  Hematological: Negative for adenopathy. Does not bruise/bleed easily.  Psychiatric/Behavioral: Negative for dysphoric mood. Pos for anxious symptoms fairly well controlled , pos for stressors that are improving        Objective:   Physical Exam  Constitutional: She appears well-developed and well-nourished. No distress.  obese and well appearing   HENT:  Head: Normocephalic and atraumatic.  Mouth/Throat: Oropharynx is clear and moist.  Eyes: Conjunctivae and EOM are normal. Pupils are equal, round, and reactive to light.  Neck: Normal range of motion. Neck supple. No JVD present. Carotid bruit is not present. No thyromegaly present.  Cardiovascular: Normal rate, regular rhythm, normal heart sounds and intact distal pulses.  Exam reveals no gallop.     Pulmonary/Chest: Effort normal and breath sounds normal. No respiratory distress. She has no wheezes. She has no rales.  No crackles  Abdominal: Soft. Bowel sounds are normal. She exhibits no distension, no abdominal bruit and no mass. There is no tenderness.  Musculoskeletal: She exhibits no edema.  Lymphadenopathy:    She has no cervical adenopathy.  Neurological: She is alert. She has normal reflexes.  Skin: Skin is warm and dry. No rash noted. No pallor.  Psychiatric: She has a normal mood and affect.  Mildly anxious , somewhat timid today          Assessment & Plan:   Problem List Items Addressed This Visit      Cardiovascular and Mediastinum   Atrial fibrillation and flutter ([place])    Per pt  doing very well with current therapy and anticoagulation      Relevant Medications   fosinopril (MONOPRIL) 20 MG tablet   Essential hypertension - Primary    bp in fair control at this time  BP Readings from Last 1 Encounters:  08/20/16 132/78   No changes needed Disc lifstyle change with low sodium diet and exercise  Continues cardiology f/u  Labs reviewed from June Lipid panel today      Relevant Medications   fosinopril (MONOPRIL) 20 MG tablet   Other Relevant Orders   Lipid panel     Digestive   GERD (gastroesophageal reflux disease)    Omeprazole is very helpful -pt is unable to come off of it right now  Disc diet for GERD      Relevant Medications   omeprazole (PRILOSEC) 20 MG capsule     Other   HYPERGLYCEMIA    A1C today At risk of DM2 Disc need for wt loss and low glycemic diet  She is not motivated        Relevant Orders   Hemoglobin A1c   Obesity    Discussed how this problem influences overall health and the risks it imposes  Reviewed plan for weight loss with lower calorie diet (via better food choices and also portion control or program like weight watchers) and exercise building up to or more than 30 minutes 5 days per week including some  aerobic activity   Will start with short walks and gradually increase duration She is not motivated to change diet at all - has emotional eating habit and likes carbs  Declines counselor at this time      Somnolence, daytime    Unfortunately pt cancelled her sleep study      STRESS REACTION, ACUTE, WITH EMOTIONAL DISTURBANCE    Overall doing better-stressors have calmed down  Reviewed stressors/ coping techniques/symptoms/ support sources/ tx options and side effects in detail today  She wants to continue paxil which has been very helpful  Recommend counseling to help with this and emotional eating when ready        Other Visit Diagnoses    Need for influenza vaccination       Relevant Orders   Flu Vaccine QUAD 36+ mos IM (Completed)

## 2016-08-20 NOTE — Assessment & Plan Note (Signed)
Per pt doing very well with current therapy and anticoagulation

## 2016-08-20 NOTE — Patient Instructions (Addendum)
Make an effort to decrease carbs and sweets as much as you can- for weight loss and prevention of diabetes  Start with a 5-10 minute walk daily - add 2-5 minutes a week to get endurance up and increase energy   If you are ever interested in seeing a counselor about stress eating -please let me know   Follow up feb or later for annual exam

## 2016-08-20 NOTE — Assessment & Plan Note (Signed)
Overall doing better-stressors have calmed down  Reviewed stressors/ coping techniques/symptoms/ support sources/ tx options and side effects in detail today  She wants to continue paxil which has been very helpful  Recommend counseling to help with this and emotional eating when ready

## 2016-09-27 ENCOUNTER — Ambulatory Visit (INDEPENDENT_AMBULATORY_CARE_PROVIDER_SITE_OTHER): Payer: PPO | Admitting: Cardiovascular Disease

## 2016-09-27 ENCOUNTER — Encounter: Payer: Self-pay | Admitting: Cardiovascular Disease

## 2016-09-27 VITALS — BP 134/74 | HR 66 | Ht 60.0 in | Wt 194.5 lb

## 2016-09-27 DIAGNOSIS — R0609 Other forms of dyspnea: Secondary | ICD-10-CM

## 2016-09-27 DIAGNOSIS — I10 Essential (primary) hypertension: Secondary | ICD-10-CM

## 2016-09-27 DIAGNOSIS — E6609 Other obesity due to excess calories: Secondary | ICD-10-CM

## 2016-09-27 DIAGNOSIS — I4891 Unspecified atrial fibrillation: Secondary | ICD-10-CM

## 2016-09-27 DIAGNOSIS — I4892 Unspecified atrial flutter: Secondary | ICD-10-CM

## 2016-09-27 DIAGNOSIS — R0600 Dyspnea, unspecified: Secondary | ICD-10-CM

## 2016-09-27 DIAGNOSIS — Z6837 Body mass index (BMI) 37.0-37.9, adult: Secondary | ICD-10-CM

## 2016-09-27 DIAGNOSIS — G479 Sleep disorder, unspecified: Secondary | ICD-10-CM

## 2016-09-27 DIAGNOSIS — R079 Chest pain, unspecified: Secondary | ICD-10-CM

## 2016-09-27 NOTE — Patient Instructions (Addendum)

## 2016-09-27 NOTE — Progress Notes (Signed)
Cardiology Office Note  Date:  09/27/2016   ID:  Kerry Simpson, DOB 12-Jan-1946, MRN [id number]  PCP:  Loura Pardon, MD   Chief Complaint  Patient presents with  . other     27m f/u. Pt c/o chest pain the other day that lasted about a min but believes it was anxiety, other than that doing well. Reviewed meds with pt verbally.    HPI:  [name] is a pleasant 70 year old woman with history of obesity, hypertension, GERD who presents for routine follow-up for persistent atrial fibrillation  Started on amio 03/2016: CHADS-VASc score is greater than or equal to 3 (hypertension, age, gender)  Seen by dr. [name] 04/2016:  Tachy/brady digoxin held  sleep disordered breathing and evidence of HFpEF. Seen by [name] 06/2016, no med changes  Does not think she has OSA Cancelled sleep study Previous cardioversion cancelled, she converted on her own on amio  No exercise program though reports she is active Denies any shortness of breath Reports one episode of chest pain while visiting her sick brother, felt was anxiety otherwise no other follow-up symptoms Continued fatigue  Labs; HBA1C 6.1 totaL CHOL 167, ldl 101 NOT A SMOKER  Echo 02/2016: EF 60%, RVSp 38  EKG on today's visit shows normal sinus rhythm with rate 66 bpm, poor R-wave progression through the anterior precordial leads, left axis deviation  Echocardiogram  showing normal LV function, mildly elevated right heart pressures  She does report having issues with thyroid in the past, was told that she had Graves' disease but recent lab work shows normal TSH    PMH:   has a past medical history of Anemia; Complication of anesthesia; Fibrocystic breast; Fracture of right shoulder; Grave's disease; History of kidney stones; Hypertension; Obesity; and Osteoporosis.  PSH:    Past Surgical History:  Procedure Laterality Date  . COLONOSCOPY  09/29/2014   Normal, Delfin Edis, MD  . ELECTROPHYSIOLOGIC STUDY N/A 04/12/2016    Procedure: CARDIOVERSION;  Surgeon: Minna Merritts, MD;  Location: [facility] ORS;  Service: Cardiovascular;  Laterality: N/A;  . ESOPHAGOGASTRODUODENOSCOPY ENDOSCOPY    . EUS N/A 01/12/2015   Procedure: UPPER ENDOSCOPIC ULTRASOUND (EUS) LINEAR;  Surgeon: Milus Banister, MD;  Location: [facility];  Service: Endoscopy;  Laterality: N/A;  . TUBAL LIGATION      Current Outpatient Prescriptions  Medication Sig Dispense Refill  . acetaminophen (TYLENOL) 500 MG tablet Take 500 mg by mouth every 6 (six) hours as needed.    Marland Kitchen amiodarone (PACERONE) 200 MG tablet Take one tablet (200 mg) by mouth once daily    . amLODipine (NORVASC) 5 MG tablet Take 1 tablet (5 mg total) by mouth daily. 30 tablet 11  . Cholecalciferol (VITAMIN D-3) 1000 UNITS CAPS Take 1 capsule by mouth daily.    . cyanocobalamin 1000 MCG tablet Take 2,500 mcg by mouth daily.     . fosinopril (MONOPRIL) 20 MG tablet Take 1 tablet (20 mg total) by mouth daily. 90 tablet 3  . furosemide (LASIX) 20 MG tablet Take 1 tablet (20 mg total) by mouth daily as needed. 30 tablet 3  . Omega-3 Fatty Acids (FISH OIL PO) Take by mouth daily.    Marland Kitchen omeprazole (PRILOSEC) 20 MG capsule Take 1 capsule (20 mg total) by mouth daily. 90 capsule 3  . PARoxetine (PAXIL) 10 MG tablet Take 1 tablet (10 mg total) by mouth daily. 90 tablet 3  . Polyethyl Glycol-Propyl Glycol (SYSTANE OP) Apply to eye as needed.    Marland Kitchen  rivaroxaban (XARELTO) 20 MG TABS tablet Take 1 tablet (20 mg total) by mouth daily with supper. 30 tablet 6   No current facility-administered medications for this visit.      Allergies:   Patient has no known allergies.   Social History:  The patient  reports that she has never smoked. She has never used smokeless tobacco. She reports that she does not drink alcohol or use drugs.   Family History:   family history includes Cancer in her brother; Diabetes in her father; Heart disease in her brother; Hypertension in her brother, father, and  mother.    Review of Systems: Review of Systems  Constitutional: Positive for malaise/fatigue.  Respiratory: Negative.   Cardiovascular: Positive for chest pain.  Gastrointestinal: Negative.   Musculoskeletal: Negative.   Neurological: Negative.   Psychiatric/Behavioral: Negative.   All other systems reviewed and are negative.    PHYSICAL EXAM: VS:  BP 134/74 (BP Location: Left Arm, Patient Position: Sitting, Cuff Size: Large)   Pulse 66   Ht 5' (1.524 m)   Wt 194 lb 8 oz (88.2 kg)   BMI 37.99 kg/m  , BMI Body mass index is 37.99 kg/m. GEN: Well nourished, well developed, in no acute distress , obese  HEENT: normal  Neck: no JVD, carotid bruits, or masses Cardiac: RRR; no murmurs, rubs, or gallops,no edema  Respiratory:  clear to auscultation bilaterally, normal work of breathing GI: soft, nontender, nondistended, + BS MS: no deformity or atrophy  Skin: warm and dry, no rash Neuro:  Strength and sensation are intact Psych: euthymic mood, full affect    Recent Labs: 04/11/2016: BUN 15; Creatinine, Ser 0.94; Hemoglobin 12.8; Platelets 226; Potassium 4.4; Sodium 138 06/27/2016: ALT 24; TSH 2.690    Lipid Panel Lab Results  Component Value Date   CHOL 167 08/20/2016   HDL 49.00 08/20/2016   LDLCALC 101 (H) 08/20/2016   TRIG 87.0 08/20/2016      Wt Readings from Last 3 Encounters:  09/27/16 194 lb 8 oz (88.2 kg)  08/20/16 194 lb 4 oz (88.1 kg)  06/27/16 191 lb 4 oz (86.8 kg)       ASSESSMENT AND PLAN:  Essential hypertension - Plan: EKG 12-Lead Blood pressure is well controlled on today's visit. No changes made to the medications.  Atrial fibrillation and flutter ([place]) - Plan: EKG 12-Lead Discussed her medical regiment with her, denies any arrhythmia concerning for atrial fibrillation No palpitations Tolerating anticoagulation  Class 2 obesity due to excess calories without serious comorbidity with body mass index (BMI) of 37.0 to 37.9 in adult We have  encouraged continued exercise, careful diet management in an effort to lose weight.  DOE (dyspnea on exertion) Recommended walking program  Notes reviewed from pulmonary , did not do as well as they would like on 6 minute walk test  Unable to perform PFTs   Chest pain, unspecified type Atypical chest pain, details discussed with her. Otherwise no symptoms on exertion  Low risk factors, low risk for coronary disease  Recommended she call as if she has any further symptoms, stress testing could be ordered   Sleep disturbance She declined sleep study Discussed with her. Reports that she sleeps fine   Total encounter time more than 25 minutes  Greater than 50% was spent in counseling and coordination of care with the patient   Disposition:   F/U  12 months   Orders Placed This Encounter  Procedures  . EKG 12-Lead  Signed, Esmond Plants, M.D., Ph.D. 09/27/2016  [place], [place]

## 2016-10-09 ENCOUNTER — Other Ambulatory Visit: Payer: Self-pay | Admitting: Family Medicine

## 2016-10-09 DIAGNOSIS — Z1231 Encounter for screening mammogram for malignant neoplasm of breast: Secondary | ICD-10-CM

## 2016-10-15 ENCOUNTER — Other Ambulatory Visit: Payer: Self-pay

## 2016-10-15 MED ORDER — RIVAROXABAN 20 MG PO TABS: 20.0000 mg | ORAL_TABLET | Freq: Every day | ORAL | 6 refills | Status: DC

## 2016-10-16 ENCOUNTER — Ambulatory Visit: Payer: PPO

## 2016-10-23 ENCOUNTER — Encounter: Payer: PPO | Admitting: Family Medicine

## 2016-10-30 ENCOUNTER — Ambulatory Visit
Admission: RE | Admit: 2016-10-30 | Discharge: 2016-10-30 | Disposition: A | Discharge status: Home / Self Care | Payer: PPO | Source: Ambulatory Visit | Attending: Family Medicine | Admitting: Family Medicine

## 2016-10-30 DIAGNOSIS — Z1231 Encounter for screening mammogram for malignant neoplasm of breast: Secondary | ICD-10-CM

## 2017-01-14 ENCOUNTER — Telehealth: Payer: Self-pay | Admitting: Family Medicine

## 2017-01-14 NOTE — Telephone Encounter (Signed)
Pt declined AWV and CPE

## 2017-03-19 ENCOUNTER — Other Ambulatory Visit: Payer: Self-pay | Admitting: *Deleted

## 2017-03-19 MED ORDER — AMLODIPINE BESYLATE 5 MG PO TABS: 5.0000 mg | ORAL_TABLET | Freq: Every day | ORAL | 3 refills | Status: DC

## 2017-04-14 ENCOUNTER — Other Ambulatory Visit: Payer: Self-pay

## 2017-04-14 MED ORDER — RIVAROXABAN 20 MG PO TABS: 20.0000 mg | ORAL_TABLET | Freq: Every day | ORAL | 3 refills | Status: DC

## 2017-04-14 NOTE — Telephone Encounter (Signed)
Requested Prescriptions   Signed Prescriptions Disp Refills  . rivaroxaban (XARELTO) 20 MG TABS tablet 30 tablet 3    Sig: Take 1 tablet (20 mg total) by mouth daily with supper.    Authorizing Provider: Minna Merritts    Ordering User: Janan Ridge

## 2017-05-03 ENCOUNTER — Telehealth: Payer: Self-pay | Admitting: Family Medicine

## 2017-05-03 DIAGNOSIS — R7309 Other abnormal glucose: Secondary | ICD-10-CM

## 2017-05-03 DIAGNOSIS — Z0000 Encounter for general adult medical examination without abnormal findings: Secondary | ICD-10-CM

## 2017-05-03 NOTE — Telephone Encounter (Signed)
-----   Message from Eustace Pen, LPN sent at [phone]/32/2567  6:08 PM EDT ----- Regarding: labs 6/27 [employer]  Please place lab orders.

## 2017-05-07 ENCOUNTER — Ambulatory Visit (INDEPENDENT_AMBULATORY_CARE_PROVIDER_SITE_OTHER): Payer: PPO

## 2017-05-07 VITALS — BP 130/80 | HR 71 | Temp 98.4°F | Ht 60.0 in | Wt 195.5 lb

## 2017-05-07 DIAGNOSIS — Z1159 Encounter for screening for other viral diseases: Secondary | ICD-10-CM | POA: Diagnosis not present

## 2017-05-07 DIAGNOSIS — Z0000 Encounter for general adult medical examination without abnormal findings: Secondary | ICD-10-CM

## 2017-05-07 DIAGNOSIS — R7309 Other abnormal glucose: Secondary | ICD-10-CM | POA: Diagnosis not present

## 2017-05-07 LAB — COMPREHENSIVE METABOLIC PANEL
ALK PHOS: 81 U/L (ref 39–117)
ALT: 15 U/L (ref 0–35)
AST: 15 U/L (ref 0–37)
Albumin: 3.9 g/dL (ref 3.5–5.2)
BILIRUBIN TOTAL: 0.4 mg/dL (ref 0.2–1.2)
BUN: 12 mg/dL (ref 6–23)
CALCIUM: 9.1 mg/dL (ref 8.4–10.5)
CO2: 29 meq/L (ref 19–32)
CREATININE: 0.92 mg/dL (ref 0.40–1.20)
Chloride: 105 mEq/L (ref 96–112)
GFR: 63.81 mL/min (ref >60.00)
GLUCOSE: 96 mg/dL (ref 70–99)
Potassium: 3.8 mEq/L (ref 3.5–5.1)
Sodium: 139 mEq/L (ref 135–145)
TOTAL PROTEIN: 6.4 g/dL (ref 6.0–8.3)

## 2017-05-07 LAB — CBC WITH DIFFERENTIAL/PLATELET
BASOS ABS: 0.1 10*3/uL (ref 0.0–0.1)
Basophils Relative: 1.1 % (ref 0.0–3.0)
EOS ABS: 0.1 10*3/uL (ref 0.0–0.7)
Eosinophils Relative: 1.4 % (ref 0.0–5.0)
HEMATOCRIT: 38.9 % (ref 36.0–46.0)
Hemoglobin: 12.9 g/dL (ref 12.0–15.0)
LYMPHS PCT: 23.4 % (ref 12.0–46.0)
Lymphs Abs: 1.3 10*3/uL (ref 0.7–4.0)
MCHC: 33.3 g/dL (ref 30.0–36.0)
MCV: 83.1 fl (ref 78.0–100.0)
MONOS PCT: 8.7 % (ref 3.0–12.0)
Monocytes Absolute: 0.5 10*3/uL (ref 0.1–1.0)
NEUTROS ABS: 3.5 10*3/uL (ref 1.4–7.7)
NEUTROS PCT: 65.4 % (ref 43.0–77.0)
PLATELETS: 231 10*3/uL (ref 150.0–400.0)
RBC: 4.68 Mil/uL (ref 3.87–5.11)
RDW: 14.9 % (ref 11.5–15.5)
WBC: 5.4 10*3/uL (ref 4.0–10.5)

## 2017-05-07 LAB — LIPID PANEL
CHOL/HDL RATIO: 3
Cholesterol: 154 mg/dL (ref 0–200)
HDL: 45.9 mg/dL (ref >39.00)
LDL CALC: 81 mg/dL (ref 0–99)
NonHDL: 107.88
Triglycerides: 134 mg/dL (ref 0.0–149.0)
VLDL: 26.8 mg/dL (ref 0.0–40.0)

## 2017-05-07 LAB — TSH: TSH: 0.93 u[IU]/mL (ref 0.35–4.50)

## 2017-05-07 LAB — HEMOGLOBIN A1C: Hgb A1c MFr Bld: 6 % (ref 4.6–6.5)

## 2017-05-07 NOTE — Progress Notes (Signed)
PCP notes:   Health maintenance:  Hep C screening - completed Tetanus - postponed/insurance  Abnormal screenings:   None   Patient concerns:   Pt wants to discuss B12 injection vs. B12 supplementation with PCP.  Pt has concerns with constant bloating in abdomen.  Pt has increased sinus drainage at night.   Pt wants to discuss if Amlodopine could be causing fatigue.  Nurse concerns:  None  Next PCP appt:   05/19/17 @ 0830

## 2017-05-07 NOTE — Progress Notes (Signed)
Pre visit review using our clinic review tool, if applicable. No additional management support is needed unless otherwise documented below in the visit note. 

## 2017-05-07 NOTE — Patient Instructions (Signed)
Kerry Simpson , Thank you for taking time to come for your Medicare Wellness Visit. I appreciate your ongoing commitment to your health goals. Please review the following plan we discussed and let me know if I can assist you in the future.   These are the goals we discussed: Goals    . Increase physical activity          Starting 05/07/2017, I will continue to walk around perimeter of [employer] twice weekly per cardiologist's instruction.        This is a list of the screening recommended for you and due dates:  Health Maintenance  Topic Date Due  . Tetanus Vaccine  05/07/2018*  . Pap Smear  05/08/2019*  . Flu Shot  06/11/2017  . Mammogram  10/30/2017  . Colon Cancer Screening  09/29/2024  . DEXA scan (bone density measurement)  Completed  .  Hepatitis C: One time screening is recommended by Center for Disease Control  (CDC) for  adults born from [birth year] through 1966.   Completed  . Pneumonia vaccines  Completed  *Topic was postponed. The date shown is not the original due date.   Preventive Care for Adults  A healthy lifestyle and preventive care can promote health and wellness. Preventive health guidelines for adults include the following key practices.  . A routine yearly physical is a good way to check with your health care provider about your health and preventive screening. It is a chance to share any concerns and updates on your health and to receive a thorough exam.  . Visit your dentist for a routine exam and preventive care every 6 months. Brush your teeth twice a day and floss once a day. Good oral hygiene prevents tooth decay and gum disease.  . The frequency of eye exams is based on your age, health, family medical history, use  of contact lenses, and other factors. Follow your health care provider's ecommendations for frequency of eye exams.  . Eat a healthy diet. Foods like vegetables, fruits, whole grains, low-fat dairy products, and lean protein foods contain the  nutrients you need without too many calories. Decrease your intake of foods high in solid fats, added sugars, and salt. Eat the right amount of calories for you. Get information about a proper diet from your health care provider, if necessary.  . Regular physical exercise is one of the most important things you can do for your health. Most adults should get at least 150 minutes of moderate-intensity exercise (any activity that increases your heart rate and causes you to sweat) each week. In addition, most adults need muscle-strengthening exercises on 2 or more days a week.  Silver Sneakers may be a benefit available to you. To determine eligibility, you may visit the website: www.silversneakers.com or contact program at 848-730-4277 Mon-Fri between 8AM-8PM.   . Maintain a healthy weight. The body mass index (BMI) is a screening tool to identify possible weight problems. It provides an estimate of body fat based on height and weight. Your health care provider can find your BMI and can help you achieve or maintain a healthy weight.   For adults 20 years and older: ? A BMI below 18.5 is considered underweight. ? A BMI of 18.5 to 24.9 is normal. ? A BMI of 25 to 29.9 is considered overweight. ? A BMI of 30 and above is considered obese.   . Maintain normal blood lipids and cholesterol levels by exercising and minimizing your intake of saturated fat. Eat  a balanced diet with plenty of fruit and vegetables. Blood tests for lipids and cholesterol should begin at age 77 and be repeated every 5 years. If your lipid or cholesterol levels are high, you are over 50, or you are at high risk for heart disease, you may need your cholesterol levels checked more frequently. Ongoing high lipid and cholesterol levels should be treated with medicines if diet and exercise are not working.  . If you smoke, find out from your health care provider how to quit. If you do not use tobacco, please do not start.  . If you  choose to drink alcohol, please do not consume more than 2 drinks per day. One drink is considered to be 12 ounces (355 mL) of beer, 5 ounces (148 mL) of wine, or 1.5 ounces (44 mL) of liquor.  . If you are 46-19 years old, ask your health care provider if you should take aspirin to prevent strokes.  . Use sunscreen. Apply sunscreen liberally and repeatedly throughout the day. You should seek shade when your shadow is shorter than you. Protect yourself by wearing long sleeves, pants, a wide-brimmed hat, and sunglasses year round, whenever you are outdoors.  . Once a month, do a whole body skin exam, using a mirror to look at the skin on your back. Tell your health care provider of new moles, moles that have irregular borders, moles that are larger than a pencil eraser, or moles that have changed in shape or color.

## 2017-05-07 NOTE — Progress Notes (Signed)
Subjective:   Kerry Simpson is a 71 y.o. female who presents for Medicare Annual (Subsequent) preventive examination.  Review of Systems:  N/A Cardiac Risk Factors include: advanced age (>50men, >51 women);obesity (BMI >30kg/m2);hypertension     Objective:     Vitals: BP 130/80 (BP Location: Right Arm, Patient Position: Sitting, Cuff Size: Normal)   Pulse 71   Temp 98.4 F (36.9 C) (Oral)   Ht 5' (1.524 m) Comment: no shoes  Wt 195 lb 8 oz (88.7 kg)   SpO2 95%   BMI 38.18 kg/m   Body mass index is 38.18 kg/m.   Tobacco History  Smoking Status  . Never Smoker  Smokeless Tobacco  . Never Used     Counseling given: No   Past Medical History:  Diagnosis Date  . Anemia    iron def  . Complication of anesthesia    "quit breathing" -Endoscopy( 12-22-14)  . Fibrocystic breast   . Fracture of right shoulder   . Grave's disease   . History of kidney stones    not a problem at present  . Hypertension   . Obesity   . Osteoporosis    osteopenia   Past Surgical History:  Procedure Laterality Date  . COLONOSCOPY  09/29/2014   Normal, Delfin Edis, MD  . ELECTROPHYSIOLOGIC STUDY N/A 04/12/2016   Procedure: CARDIOVERSION;  Surgeon: Minna Merritts, MD;  Location: [facility] ORS;  Service: Cardiovascular;  Laterality: N/A;  . ESOPHAGOGASTRODUODENOSCOPY ENDOSCOPY    . EUS N/A 01/12/2015   Procedure: UPPER ENDOSCOPIC ULTRASOUND (EUS) LINEAR;  Surgeon: Milus Banister, MD;  Location: [facility];  Service: Endoscopy;  Laterality: N/A;  . TUBAL LIGATION     Family History  Problem Relation Age of Onset  . Hypertension Mother   . Diabetes Father   . Hypertension Father   . Hypertension Brother   . Cancer Brother        prostate  . Heart disease Brother        with bypass  . Colon cancer Neg Hx    History  Sexual Activity  . Sexual activity: Not on file    Outpatient Encounter Prescriptions as of 05/07/2017  Medication Sig  . acetaminophen (TYLENOL) 500 MG tablet  Take 500 mg by mouth every 6 (six) hours as needed.   Marland Kitchen amiodarone (PACERONE) 200 MG tablet Take one tablet (200 mg) by mouth once daily  . amLODipine (NORVASC) 5 MG tablet Take 1 tablet (5 mg total) by mouth daily.  . Cholecalciferol (VITAMIN D3 PO) Take 400 Units by mouth daily.  . cyanocobalamin 1000 MCG tablet Take 5,000 mcg by mouth daily.   . fosinopril (MONOPRIL) 20 MG tablet Take 1 tablet (20 mg total) by mouth daily.  . furosemide (LASIX) 20 MG tablet Take 1 tablet (20 mg total) by mouth daily as needed.  . Omega-3 Fatty Acids (FISH OIL PO) Take 1,200 mg by mouth daily.   Marland Kitchen omeprazole (PRILOSEC) 20 MG capsule Take 1 capsule (20 mg total) by mouth daily.  Marland Kitchen PARoxetine (PAXIL) 10 MG tablet Take 1 tablet (10 mg total) by mouth daily.  . rivaroxaban (XARELTO) 20 MG TABS tablet Take 1 tablet (20 mg total) by mouth daily with supper.  . [DISCONTINUED] Cholecalciferol (VITAMIN D-3) 1000 UNITS CAPS Take 1 capsule by mouth daily.  . [DISCONTINUED] Polyethyl Glycol-Propyl Glycol (SYSTANE OP) Apply to eye as needed.   No facility-administered encounter medications on file as of 05/07/2017.     Activities  of Daily Living In your present state of health, do you have any difficulty performing the following activities: 05/07/2017  Hearing? N  Vision? Y  Difficulty concentrating or making decisions? Y  Walking or climbing stairs? Y  Dressing or bathing? N  Doing errands, shopping? Y  Preparing Food and eating ? N  Using the Toilet? N  In the past six months, have you accidently leaked urine? N  Do you have problems with loss of bowel control? N  Managing your Medications? N  Managing your Finances? N  Housekeeping or managing your Housekeeping? N  Some recent data might be hidden    Patient Care Team: Tower, Wynelle Fanny, MD as PCP - General Tower, Wynelle Fanny, MD as Consulting Physician (Family Medicine) Bary Castilla, Forest Gleason, MD (General Surgery) Minna Merritts, MD as Consulting Physician  (Cardiology)    Assessment:     Hearing Screening   125Hz  250Hz  500Hz  1000Hz  2000Hz  3000Hz  4000Hz  6000Hz  8000Hz   Right ear:   40 40 40  40    Left ear:   40 40 40  40      Visual Acuity Screening   Right eye Left eye Both eyes  Without correction:     With correction: 20/40 20/40-1 20/30-1    Exercise Activities and Dietary recommendations Current Exercise Habits: Home exercise routine, Type of exercise: walking, Time (Minutes): 30, Frequency (Times/Week): 2, Weekly Exercise (Minutes/Week): 60, Intensity: Mild, Exercise limited by: cardiac condition(s)  Goals    . Increase physical activity          Starting 05/07/2017, I will continue to walk around perimeter of [employer] twice weekly per cardiologist's instruction.       Fall Risk Fall Risk  05/07/2017 10/13/2015 04/18/2014 04/14/2013  Falls in the past year? No No No Yes  Number falls in past yr: - - - 1   Depression Screen PHQ 2/9 Scores 05/07/2017 10/13/2015 04/18/2014 04/14/2013  PHQ - 2 Score 0 0 0 0     Cognitive Function MMSE - Mini Mental State Exam 05/07/2017  Orientation to time 5  Orientation to Place 5  Registration 3  Attention/ Calculation 0  Recall 3  Language- name 2 objects 0  Language- repeat 1  Language- follow 3 step command 3  Language- read & follow direction 0  Write a sentence 0  Copy design 0  Total score 20       PLEASE NOTE: A Mini-Cog screen was completed. Maximum score is 20. A value of 0 denotes this part of Folstein MMSE was not completed or the patient failed this part of the Mini-Cog screening.   Mini-Cog Screening Orientation to Time - Max 5 pts Orientation to Place - Max 5 pts Registration - Max 3 pts Recall - Max 3 pts Language Repeat - Max 1 pts Language Follow 3 Step Command - Max 3 pts   Immunization History  Administered Date(s) Administered  . Influenza Split 08/01/2011, 01/08/2012  . Influenza Whole 09/25/2007, 08/22/2008, 10/12/2009, 07/11/2010, 08/18/2012  .  Influenza,inj,Quad PF,36+ Mos 10/13/2015, 08/20/2016  . Pneumococcal Conjugate-13 04/18/2014  . Pneumococcal Polysaccharide-23 09/28/2010   Screening Tests Health Maintenance  Topic Date Due  . TETANUS/TDAP  05/07/2018 (Originally 07/20/2013)  . PAP SMEAR  05/08/2019 (Originally 04/15/2015)  . INFLUENZA VACCINE  06/11/2017  . MAMMOGRAM  10/30/2017  . COLONOSCOPY  09/29/2024  . DEXA SCAN  Completed  . Hepatitis C Screening  Completed  . PNA vac Low Risk Adult  Completed  Plan:     I have personally reviewed and addressed the Medicare Annual Wellness questionnaire and have noted the following in the patient's chart:  A. Medical and social history B. Use of alcohol, tobacco or illicit drugs  C. Current medications and supplements D. Functional ability and status E.  Nutritional status F.  Physical activity G. Advance directives H. List of other physicians I.  Hospitalizations, surgeries, and ER visits in previous 12 months J.  [place] to include hearing, vision, cognitive, depression L. Referrals and appointments - none  In addition, I have reviewed and discussed with patient certain preventive protocols, quality metrics, and best practice recommendations. A written personalized care plan for preventive services as well as general preventive health recommendations were provided to patient.  See attached scanned questionnaire for additional information.   Signed,   Lindell Noe, MHA, BS, LPN Health Coach

## 2017-05-08 LAB — HEPATITIS C ANTIBODY: HCV AB: NEGATIVE

## 2017-05-08 NOTE — Progress Notes (Signed)
I reviewed health advisor's note, was available for consultation, and agree with documentation and plan.  

## 2017-05-19 ENCOUNTER — Encounter: Payer: Self-pay | Admitting: Family Medicine

## 2017-05-19 ENCOUNTER — Ambulatory Visit (INDEPENDENT_AMBULATORY_CARE_PROVIDER_SITE_OTHER): Payer: PPO | Admitting: Family Medicine

## 2017-05-19 VITALS — BP 126/68 | HR 74 | Temp 98.4°F | Ht 60.0 in | Wt 195.8 lb

## 2017-05-19 DIAGNOSIS — E2839 Other primary ovarian failure: Secondary | ICD-10-CM

## 2017-05-19 DIAGNOSIS — I4891 Unspecified atrial fibrillation: Secondary | ICD-10-CM

## 2017-05-19 DIAGNOSIS — Z0000 Encounter for general adult medical examination without abnormal findings: Secondary | ICD-10-CM | POA: Diagnosis not present

## 2017-05-19 DIAGNOSIS — I4892 Unspecified atrial flutter: Secondary | ICD-10-CM

## 2017-05-19 DIAGNOSIS — Z6837 Body mass index (BMI) 37.0-37.9, adult: Secondary | ICD-10-CM

## 2017-05-19 DIAGNOSIS — E6609 Other obesity due to excess calories: Secondary | ICD-10-CM

## 2017-05-19 DIAGNOSIS — I5030 Unspecified diastolic (congestive) heart failure: Secondary | ICD-10-CM | POA: Diagnosis not present

## 2017-05-19 DIAGNOSIS — M8580 Other specified disorders of bone density and structure, unspecified site: Secondary | ICD-10-CM

## 2017-05-19 DIAGNOSIS — I10 Essential (primary) hypertension: Secondary | ICD-10-CM

## 2017-05-19 DIAGNOSIS — R7309 Other abnormal glucose: Secondary | ICD-10-CM | POA: Diagnosis not present

## 2017-05-19 DIAGNOSIS — E0590 Thyrotoxicosis, unspecified without thyrotoxic crisis or storm: Secondary | ICD-10-CM | POA: Diagnosis not present

## 2017-05-19 MED ORDER — OMEPRAZOLE 20 MG PO CPDR: 20.0000 mg | DELAYED_RELEASE_CAPSULE | Freq: Every day | ORAL | 3 refills | Status: DC

## 2017-05-19 MED ORDER — PAROXETINE HCL 10 MG PO TABS: 10.0000 mg | ORAL_TABLET | Freq: Every day | ORAL | 3 refills | Status: DC

## 2017-05-19 MED ORDER — FOSINOPRIL SODIUM 20 MG PO TABS: 20.0000 mg | ORAL_TABLET | Freq: Every day | ORAL | 3 refills | Status: DC

## 2017-05-19 NOTE — Assessment & Plan Note (Signed)
Stable/no symptoms Followed by cardiology

## 2017-05-19 NOTE — Progress Notes (Signed)
Subjective:    Patient ID: Kerry Simpson, female    DOB: [birth year], 71 y.o.   MRN: [id number]  HPI Here for health maintenance exam and to review chronic medical problems    Overall feeling good and doing well   Doing well with a fib - yearly f/u  On anticoag with Xarelto She generally feels tired a lot   Had amw on 6/27 No concerns  Tetanus- postponed for ins   Wt Readings from Last 3 Encounters:  05/19/17 195 lb 12 oz (88.8 kg)  05/07/17 195 lb 8 oz (88.7 kg)  09/27/16 194 lb 8 oz (88.2 kg)  bmi is 38.2 No regular exercise- walking occ  Not interested in a program -refuses /dislikes it   dexa 6/14-osteopenia  Wants to set that up  Takes vit D / not calcium  Little walking/ no regular exercise    mammo neg 12/17 Self breast exam- no lumps   No gyn problems or symptoms   colonoscopy 11/15-nl with 10 y recall   Zoster status - had vaccine (zostavax)   bp is stable today  No cp or palpitations or headaches or edema  No side effects to medicines  BP Readings from Last 3 Encounters:  05/19/17 126/68  05/07/17 130/80  09/27/16 134/74      Hx of hyperthyroidism Lab Results  Component Value Date   TSH 0.93 05/07/2017   Hyperglycemia Lab Results  Component Value Date   HGBA1C 6.0 05/07/2017  fairly stable  She does not watch her diet for sweets/carbs   Cholesterol Lab Results  Component Value Date   CHOL 154 05/07/2017   CHOL 167 08/20/2016   CHOL 142 04/15/2014   Lab Results  Component Value Date   HDL 45.90 05/07/2017   HDL 49.00 08/20/2016   HDL 41.90 04/15/2014   Lab Results  Component Value Date   LDLCALC 81 05/07/2017   LDLCALC 101 (H) 08/20/2016   LDLCALC 86 04/15/2014   Lab Results  Component Value Date   TRIG 134.0 05/07/2017   TRIG 87.0 08/20/2016   TRIG 73.0 04/15/2014   Lab Results  Component Value Date   CHOLHDL 3 05/07/2017   CHOLHDL 3 08/20/2016   CHOLHDL 3 04/15/2014   No results found for: LDLDIRECT  LDL is  down ? Eating a bit better    Chemistry      Component Value Date/Time   NA 139 05/07/2017 1016   K 3.8 05/07/2017 1016   CL 105 05/07/2017 1016   CO2 29 05/07/2017 1016   BUN 12 05/07/2017 1016   CREATININE 0.92 05/07/2017 1016   CREATININE 0.97 (H) 08/25/2015 1707      Component Value Date/Time   CALCIUM 9.1 05/07/2017 1016   ALKPHOS 81 05/07/2017 1016   AST 15 05/07/2017 1016   ALT 15 05/07/2017 1016   BILITOT 0.4 05/07/2017 1016   BILITOT 0.3 06/27/2016 1042      Lab Results  Component Value Date   WBC 5.4 05/07/2017   HGB 12.9 05/07/2017   HCT 38.9 05/07/2017   MCV 83.1 05/07/2017   PLT 231.0 05/07/2017    Neg hep C screen also   Patient Active Problem List   Diagnosis Date Noted  . GERD (gastroesophageal reflux disease) 08/20/2016  . DOE (dyspnea on exertion) 05/28/2016  . Sleep disturbance 05/28/2016  . Somnolence, daytime 05/09/2016  . Encounter for anticoagulation discussion and counseling 02/15/2016  . Atrial fibrillation and flutter ([place]) 02/14/2016  . Routine general  medical examination at a health care facility 10/13/2015  . Estrogen deficiency 10/13/2015  . Fatigue 08/25/2015  . History of melena 12/12/2014  . Gallstone 12/07/2014  . RUQ pain 12/05/2014  . Encounter for routine gynecological examination 04/14/2013  . Encounter for Medicare annual wellness exam 03/30/2013  . Other screening mammogram 01/08/2012  . STRESS REACTION, ACUTE, WITH EMOTIONAL DISTURBANCE 11/26/2010  . HYPERGLYCEMIA 09/28/2010  . Osteopenia 06/21/2009  . Hyperthyroidism 04/26/2008  . Obesity 04/26/2008  . Essential hypertension 04/26/2008   Past Medical History:  Diagnosis Date  . Anemia    iron def  . Complication of anesthesia    "quit breathing" -Endoscopy( 12-22-14)  . Fibrocystic breast   . Fracture of right shoulder   . Grave's disease   . History of kidney stones    not a problem at present  . Hypertension   . Obesity   . Osteoporosis    osteopenia    Past Surgical History:  Procedure Laterality Date  . COLONOSCOPY  09/29/2014   Normal, Delfin Edis, MD  . ELECTROPHYSIOLOGIC STUDY N/A 04/12/2016   Procedure: CARDIOVERSION;  Surgeon: Minna Merritts, MD;  Location: [facility];  Service: Cardiovascular;  Laterality: N/A;  . ESOPHAGOGASTRODUODENOSCOPY ENDOSCOPY    . EUS N/A 01/12/2015   Procedure: UPPER ENDOSCOPIC ULTRASOUND (EUS) LINEAR;  Surgeon: Milus Banister, MD;  Location: [facility];  Service: Endoscopy;  Laterality: N/A;  . TUBAL LIGATION     Social History  Substance Use Topics  . Smoking status: Never Smoker  . Smokeless tobacco: Never Used  . Alcohol use No   Family History  Problem Relation Age of Onset  . Hypertension Mother   . Diabetes Father   . Hypertension Father   . Hypertension Brother   . Cancer Brother        prostate  . Heart disease Brother        with bypass  . Colon cancer Neg Hx    No Known Allergies Current Outpatient Prescriptions on File Prior to Visit  Medication Sig Dispense Refill  . acetaminophen (TYLENOL) 500 MG tablet Take 500 mg by mouth every 6 (six) hours as needed.     Marland Kitchen amiodarone (PACERONE) 200 MG tablet Take one tablet (200 mg) by mouth once daily    . amLODipine (NORVASC) 5 MG tablet Take 1 tablet (5 mg total) by mouth daily. 30 tablet 3  . Cholecalciferol (VITAMIN D3 PO) Take 400 Units by mouth daily.    . cyanocobalamin 1000 MCG tablet Take 5,000 mcg by mouth daily.     . furosemide (LASIX) 20 MG tablet Take 1 tablet (20 mg total) by mouth daily as needed. 30 tablet 3  . Omega-3 Fatty Acids (FISH OIL PO) Take 1,200 mg by mouth daily.     . rivaroxaban (XARELTO) 20 MG TABS tablet Take 1 tablet (20 mg total) by mouth daily with supper. 30 tablet 3   No current facility-administered medications on file prior to visit.      Review of Systems    Review of Systems  Constitutional: Negative for fever, appetite change,  and unexpected weight change.  Eyes: Negative for pain and  visual disturbance.  Respiratory: Negative for cough and shortness of breath.   Cardiovascular: Negative for cp or palpitations    Gastrointestinal: Negative for nausea, diarrhea and constipation.  Genitourinary: Negative for urgency and frequency.  Skin: Negative for pallor or rash   Neurological: Negative for weakness, light-headedness, numbness and headaches.  Hematological: Negative  for adenopathy. Does not bruise/bleed easily.  Psychiatric/Behavioral: Negative for dysphoric mood. The patient is not nervous/anxious.      Objective:   Physical Exam  Constitutional: She appears well-developed and well-nourished. No distress.  obese and well appearing   HENT:  Head: Normocephalic and atraumatic.  Right Ear: External ear normal.  Left Ear: External ear normal.  Mouth/Throat: Oropharynx is clear and moist.  Eyes: Conjunctivae and EOM are normal. Pupils are equal, round, and reactive to light. No scleral icterus.  Neck: Normal range of motion. Neck supple. No JVD present. Carotid bruit is not present. No thyromegaly present.  Cardiovascular: Normal rate, regular rhythm, normal heart sounds and intact distal pulses.  Exam reveals no gallop.   Pulmonary/Chest: Effort normal and breath sounds normal. No respiratory distress. She has no wheezes. She exhibits no tenderness.  Abdominal: Soft. Bowel sounds are normal. She exhibits no distension, no abdominal bruit and no mass. There is no tenderness.  Genitourinary: No breast swelling, tenderness, discharge or bleeding.  Genitourinary Comments: Breast exam: No mass, nodules, thickening, tenderness, bulging, retraction, inflamation, nipple discharge or skin changes noted.  No axillary or clavicular LA.      Musculoskeletal: Normal range of motion. She exhibits no edema or tenderness.  Lymphadenopathy:    She has no cervical adenopathy.  Neurological: She is alert. She has normal reflexes. No cranial nerve deficit. She exhibits normal muscle tone.  Coordination normal.  Skin: Skin is warm and dry. No rash noted. No erythema. No pallor.  Solar lentigines diffusely  Mildly tanned in sun exposed areas   Psychiatric: She has a normal mood and affect.          Assessment & Plan:   Problem List Items Addressed This Visit      Cardiovascular and Mediastinum   Atrial fibrillation and flutter (HCC)    Stable with care of cardiology  On xarelto      Relevant Medications   fosinopril (MONOPRIL) 20 MG tablet   Essential hypertension    bp in fair control at this time  BP Readings from Last 1 Encounters:  05/19/17 126/68   No changes needed Disc lifstyle change with low sodium diet and exercise  Labs reviewed  Wt loss enc       Relevant Medications   fosinopril (MONOPRIL) 20 MG tablet     Endocrine   Hyperthyroidism    Hypothyroidism  Pt has no clinical changes No change in energy level/ hair or skin/ edema and no tremor Lab Results  Component Value Date   TSH 0.93 05/07/2017             Musculoskeletal and Integument   Osteopenia    Due for dexa  Disc need for calcium/ vitamin D/ wt bearing exercise and bone density test every 2 y to monitor Disc safety/ fracture risk in detail  No falls or fractures         Other   Estrogen deficiency   Relevant Orders   DG Bone Density   HYPERGLYCEMIA    Lab Results  Component Value Date   HGBA1C 6.0 05/07/2017   disc imp of low glycemic diet and wt loss to prevent DM2       Obesity    Discussed how this problem influences overall health and the risks it imposes  Reviewed plan for weight loss with lower calorie diet (via better food choices and also portion control or program like weight watchers) and exercise building up to or more  than 30 minutes 5 days per week including some aerobic activity         Routine general medical examination at a health care facility - Primary    Reviewed health habits including diet and exercise and skin cancer  prevention Reviewed appropriate screening tests for age  Also reviewed health mt list, fam hx and immunization status , as well as social and family history   See HPI Labs rev  AMW reviewed  Enc exercise-pt declines  Enc low glycemic diet and wt loss  dexa ordered  Check in with a pharmacy regarding a tetanus shot For bone health continue vitamin D and add calcium 600 mg twice daily   start cutting sugar and carbs in diet to prevent diabetes Think about some exercise also  Wear sunscreen when out in the sun   We will refer you for dexa at check out

## 2017-05-19 NOTE — Assessment & Plan Note (Signed)
Lab Results  Component Value Date   HGBA1C 6.0 05/07/2017   disc imp of low glycemic diet and wt loss to prevent DM2

## 2017-05-19 NOTE — Assessment & Plan Note (Signed)
Due for dexa  Disc need for calcium/ vitamin D/ wt bearing exercise and bone density test every 2 y to monitor Disc safety/ fracture risk in detail  No falls or fractures

## 2017-05-19 NOTE — Assessment & Plan Note (Signed)
Discussed how this problem influences overall health and the risks it imposes  Reviewed plan for weight loss with lower calorie diet (via better food choices and also portion control or program like weight watchers) and exercise building up to or more than 30 minutes 5 days per week including some aerobic activity    

## 2017-05-19 NOTE — Assessment & Plan Note (Signed)
Stable with care of cardiology  On xarelto

## 2017-05-19 NOTE — Assessment & Plan Note (Signed)
bp in fair control at this time  BP Readings from Last 1 Encounters:  05/19/17 126/68   No changes needed Disc lifstyle change with low sodium diet and exercise  Labs reviewed  Wt loss enc

## 2017-05-19 NOTE — Assessment & Plan Note (Addendum)
Reviewed health habits including diet and exercise and skin cancer prevention Reviewed appropriate screening tests for age  Also reviewed health mt list, fam hx and immunization status , as well as social and family history   See HPI Labs rev  AMW reviewed  Enc exercise-pt declines  Enc low glycemic diet and wt loss  dexa ordered  Check in with a pharmacy regarding a tetanus shot For bone health continue vitamin D and add calcium 600 mg twice daily   start cutting sugar and carbs in diet to prevent diabetes Think about some exercise also  Wear sunscreen when out in the sun   We will refer you for dexa at check out

## 2017-05-19 NOTE — Assessment & Plan Note (Signed)
Hypothyroidism  Pt has no clinical changes No change in energy level/ hair or skin/ edema and no tremor Lab Results  Component Value Date   TSH 0.93 05/07/2017

## 2017-05-19 NOTE — Patient Instructions (Addendum)
Check in with a pharmacy regarding a tetanus shot For bone health continue vitamin D and add calcium 600 mg twice daily   start cutting sugar and carbs in diet to prevent diabetes Think about some exercise also  Wear sunscreen when out in the sun   We will refer you for dexa at check out

## 2017-06-12 ENCOUNTER — Telehealth: Payer: Self-pay | Admitting: Cardiovascular Disease

## 2017-06-12 NOTE — Telephone Encounter (Signed)
Pt calling stating she is going to be in donut hole soon on her Elquis  She is asking for some advise on this She asked if it was possible to spread it out the medications she has right now Maybe take it every other day  She is not sure she states Please call back

## 2017-06-12 NOTE — Telephone Encounter (Signed)
Spoke w/ pt.  Stressed to her the importance of taking her Xarelto (not Eliquis)  as prescribed.   Advised her that we can help her w/ samples when we have them, but unfortunately we do not have any at this time.  Advised her that I am mailing her the patient assistance paperwork to complete and mail back to [employer] to see if she will qualify for financial assistance. Asked her to call back if she needs any help w/ this. She is appreciative of the call and will check back periodically to see if we have samples available.

## 2017-06-19 ENCOUNTER — Other Ambulatory Visit: Payer: Self-pay | Admitting: Internal Medicine

## 2017-07-15 ENCOUNTER — Other Ambulatory Visit: Payer: Self-pay | Admitting: Cardiovascular Disease

## 2017-07-16 DIAGNOSIS — H2513 Age-related nuclear cataract, bilateral: Secondary | ICD-10-CM | POA: Diagnosis not present

## 2017-08-08 ENCOUNTER — Other Ambulatory Visit: Payer: Self-pay | Admitting: Cardiovascular Disease

## 2017-08-18 ENCOUNTER — Other Ambulatory Visit: Payer: Self-pay

## 2017-08-18 ENCOUNTER — Other Ambulatory Visit: Payer: Self-pay | Admitting: Cardiovascular Disease

## 2017-08-18 ENCOUNTER — Other Ambulatory Visit: Payer: Self-pay | Admitting: Internal Medicine

## 2017-08-18 MED ORDER — AMIODARONE HCL 200 MG PO TABS: ORAL_TABLET | ORAL | 2 refills | Status: DC

## 2017-10-04 NOTE — Progress Notes (Addendum)
Cardiology Office Note  Date:  10/06/2017   ID:  Kerry Simpson, DOB 1946/06/01, MRN [id number]  PCP:  Abner Greenspan, MD   Chief Complaint  Patient presents with  . other    69mo f/u. Pt c/o fatigue, head aches; denies sob, cp. Reviewed meds with pt verbally.    HPI:  [name] is a pleasant 71 year old woman with history of  obesity,  hypertension,  GERD  who presents for routine follow-up for persistent atrial fibrillation  In follow-up today she reports that she feels well Denies any tachycardia concerning for atrial fibrillation Tolerating anticoagulation, takes Xarelto daily No regular exercise program Weight slowly trending upwards  Reports having chronic fatigue  Labs reviewed with her in detail HBA1C 6.1 totaL CHOL 154,  Ldl 81 NOT A SMOKER  Echo 02/2016: EF 60%, RVSp 38  EKG on today's visit shows normal sinus rhythm with rate 72 bpm, poor R-wave progression through the anterior precordial leads, left axis deviation  Past history reviewed with her in detail Started on amio 03/2016: CHADS-VASc score is greater than or equal to 3 (hypertension, age, gender)  Seen by dr. [name] 04/2016:  Tachy/brady digoxin held  sleep disordered breathing and evidence of HFpEF. Seen by [name] 06/2016, no med changes  Does not think she has OSA Cancelled sleep study Previous cardioversion cancelled, she converted on her own on amio  Echocardiogram  showing normal LV function, mildly elevated right heart pressures  She does report having issues with thyroid in the past, was told that she had Graves' disease but recent lab work shows normal TSH    PMH:   has a past medical history of Anemia, Complication of anesthesia, Fibrocystic breast, Fracture of right shoulder, Grave's disease, History of kidney stones, Hypertension, Obesity, and Osteoporosis.  PSH:    Past Surgical History:  Procedure Laterality Date  . COLONOSCOPY  09/29/2014   Normal, Delfin Edis, MD   . ELECTROPHYSIOLOGIC STUDY N/A 04/12/2016   Procedure: CARDIOVERSION;  Surgeon: Minna Merritts, MD;  Location: [facility] ORS;  Service: Cardiovascular;  Laterality: N/A;  . ESOPHAGOGASTRODUODENOSCOPY ENDOSCOPY    . EUS N/A 01/12/2015   Procedure: UPPER ENDOSCOPIC ULTRASOUND (EUS) LINEAR;  Surgeon: Milus Banister, MD;  Location: [facility];  Service: Endoscopy;  Laterality: N/A;  . TUBAL LIGATION      Current Outpatient Medications  Medication Sig Dispense Refill  . acetaminophen (TYLENOL) 500 MG tablet Take 1,000 mg by mouth every 6 (six) hours as needed.     Marland Kitchen amiodarone (PACERONE) 200 MG tablet Take one tablet (200 mg) by mouth once daily 30 tablet 2  . amLODipine (NORVASC) 5 MG tablet TAKE 1 TABLET BY MOUTH ONCE DAILY 30 tablet 3  . Cholecalciferol (VITAMIN D3 PO) Take 400 Units by mouth daily.    . cyanocobalamin 1000 MCG tablet Take 5,000 mcg by mouth daily.     . fosinopril (MONOPRIL) 20 MG tablet Take 1 tablet (20 mg total) by mouth daily. 90 tablet 3  . furosemide (LASIX) 20 MG tablet Take 1 tablet (20 mg total) by mouth daily as needed. 30 tablet 3  . Omega-3 Fatty Acids (FISH OIL PO) Take 1,200 mg by mouth daily.     Marland Kitchen omeprazole (PRILOSEC) 20 MG capsule Take 1 capsule (20 mg total) by mouth daily. 90 capsule 3  . PARoxetine (PAXIL) 10 MG tablet Take 1 tablet (10 mg total) by mouth daily. 90 tablet 3  . XARELTO 20 MG TABS tablet TAKE 1  TABLET BY MOUTH ONCE DAILY WITH SUPPER 30 tablet 2   No current facility-administered medications for this visit.      Allergies:   Patient has no known allergies.   Social History:  The patient  reports that  has never smoked. she has never used smokeless tobacco. She reports that she does not drink alcohol or use drugs.   Family History:   family history includes Cancer in her brother; Diabetes in her father; Heart disease in her brother; Hypertension in her brother, father, and mother.    Review of Systems: Review of Systems   Constitutional: Negative.   Respiratory: Negative.   Cardiovascular: Negative.   Gastrointestinal: Negative.   Musculoskeletal: Negative.   Neurological: Negative.   Psychiatric/Behavioral: Negative.   All other systems reviewed and are negative.    PHYSICAL EXAM: VS:  BP 126/78 (BP Location: Left Arm, Patient Position: Sitting, Cuff Size: Normal)   Pulse 72   Ht 5' (1.524 m)   Wt 198 lb 4 oz (89.9 kg)   BMI 38.72 kg/m  , BMI Body mass index is 38.72 kg/m. GEN: Well nourished, well developed, in no acute distress , obese  HEENT: normal  Neck: no JVD, carotid bruits, or masses Cardiac: RRR; no murmurs, rubs, or gallops,no edema  Respiratory:  clear to auscultation bilaterally, normal work of breathing GI: soft, nontender, nondistended, + BS MS: no deformity or atrophy  Skin: warm and dry, no rash Neuro:  Strength and sensation are intact Psych: euthymic mood, full affect    Recent Labs: 05/07/2017: ALT 15; BUN 12; Creatinine, Ser 0.92; Hemoglobin 12.9; Platelets 231.0; Potassium 3.8; Sodium 139; TSH 0.93    Lipid Panel Lab Results  Component Value Date   CHOL 154 05/07/2017   HDL 45.90 05/07/2017   LDLCALC 81 05/07/2017   TRIG 134.0 05/07/2017      Wt Readings from Last 3 Encounters:  10/06/17 198 lb 4 oz (89.9 kg)  05/19/17 195 lb 12 oz (88.8 kg)  05/07/17 195 lb 8 oz (88.7 kg)       ASSESSMENT AND PLAN:  Essential hypertension - Plan: EKG 12-Lead Blood pressure is well controlled on today's visit. No changes made to the medications. Stable  Atrial fibrillation and flutter ([place]) - Plan: EKG 12-Lead Discussed her medical regiment with her, denies any arrhythmia concerning for atrial fibrillation Tolerating anticoagulation  Class 2 obesity due to excess calories without serious comorbidity with body mass index (BMI) of 37.0 to 37.9 in adult We have encouraged continued exercise, careful diet management in an effort to lose weight. She does not like  going to the gym  DOE (dyspnea on exertion) Recommended walking program  Followed by pulmonary Unable to perform PFTs  Likely secondary to obesity, deconditioning  Chest pain, unspecified type No further chest pain on today's visit No further workup ordered  Sleep disturbance She has previously declined sleep study . Reports that she sleeps fine we have recommended exercise for weight loss    Total encounter time more than 25 minutes  Greater than 50% was spent in counseling and coordination of care with the patient   Disposition:   F/U  12 months   Orders Placed This Encounter  Procedures  . EKG 12-Lead     Signed, Esmond Plants, M.D., Ph.D. 10/06/2017  [place], [place]

## 2017-10-06 ENCOUNTER — Encounter: Payer: Self-pay | Admitting: Cardiovascular Disease

## 2017-10-06 ENCOUNTER — Ambulatory Visit: Payer: PPO | Admitting: Cardiovascular Disease

## 2017-10-06 VITALS — BP 126/78 | HR 72 | Ht 60.0 in | Wt 198.2 lb

## 2017-10-06 DIAGNOSIS — I4891 Unspecified atrial fibrillation: Secondary | ICD-10-CM | POA: Diagnosis not present

## 2017-10-06 DIAGNOSIS — Z7189 Other specified counseling: Secondary | ICD-10-CM | POA: Diagnosis not present

## 2017-10-06 DIAGNOSIS — I10 Essential (primary) hypertension: Secondary | ICD-10-CM | POA: Diagnosis not present

## 2017-10-06 DIAGNOSIS — I5030 Unspecified diastolic (congestive) heart failure: Secondary | ICD-10-CM

## 2017-10-06 DIAGNOSIS — I4892 Unspecified atrial flutter: Secondary | ICD-10-CM | POA: Diagnosis not present

## 2017-10-06 NOTE — Patient Instructions (Signed)

## 2017-10-17 ENCOUNTER — Other Ambulatory Visit: Payer: Self-pay | Admitting: Internal Medicine

## 2017-12-09 ENCOUNTER — Other Ambulatory Visit: Payer: Self-pay | Admitting: Cardiovascular Disease

## 2017-12-09 NOTE — Telephone Encounter (Signed)
Refill Request.  

## 2017-12-18 ENCOUNTER — Other Ambulatory Visit: Payer: Self-pay | Admitting: Cardiovascular Disease

## 2018-02-04 ENCOUNTER — Other Ambulatory Visit: Payer: Self-pay | Admitting: Cardiovascular Disease

## 2018-02-05 NOTE — Telephone Encounter (Signed)
Please review for refill, Thanks !  

## 2018-03-09 ENCOUNTER — Other Ambulatory Visit: Payer: Self-pay | Admitting: Family Medicine

## 2018-03-19 ENCOUNTER — Other Ambulatory Visit: Payer: Self-pay | Admitting: Internal Medicine

## 2018-03-19 NOTE — Telephone Encounter (Signed)
Please advise if [name] should fill Amiodarone? Pt has not seen [name] since 2017 last OV told to see TG in 3 months. No other details about when to f/u with [name].

## 2018-03-31 ENCOUNTER — Ambulatory Visit (INDEPENDENT_AMBULATORY_CARE_PROVIDER_SITE_OTHER): Payer: Medicare HMO | Admitting: General Surgery

## 2018-03-31 ENCOUNTER — Encounter: Payer: Self-pay | Admitting: General Surgery

## 2018-03-31 ENCOUNTER — Ambulatory Visit: Payer: Medicare Other | Admitting: General Surgery

## 2018-03-31 VITALS — BP 140/78 | HR 70 | Resp 20 | Ht 60.0 in | Wt 198.0 lb

## 2018-03-31 DIAGNOSIS — R1084 Generalized abdominal pain: Secondary | ICD-10-CM | POA: Diagnosis not present

## 2018-03-31 NOTE — Patient Instructions (Addendum)
Follow up as needed or if pain continues or gets worse Patient instructed to pay attention to activity when pain occures  The patient is aware to call back for any questions or concerns.

## 2018-03-31 NOTE — Progress Notes (Signed)
Patient ID: Kerry Simpson, female   DOB: May 01, 1946, 72 y.o.   MRN: [id number]  Chief Complaint  Patient presents with  . Follow-up    HPI Kerry Simpson is a 72 y.o. female.  Here for re evaluation of her gall bladder pain last seen in 2016. She states the pain in the past has been right upper quadrant but within the past 3-4 months the pain is also on the left side as well. The left sided pain is occurring once or twice a week, lasting about 30 minutes to an hour. Not associated with any particular foods. She does know salads "run through" her. She states that sometimes when she eats she feels "like she is going to explode". She states she feels bloated and tight about 30 minutes after eating. No nausea or vomiting. Bowel move daily. She has had a few episodes of diarrhea over the past 2 weeks, described as pudding consistency. About 2-3 weeks ago she was bending over and she felt like "something got tangled" in her left side. She states when she lays down and stretches out the pain will easy up. Denies any numbness in legs.  HPI  Past Medical History:  Diagnosis Date  . Anemia    iron def  . Atrial fibrillation (Kerry Simpson)    Dr Kerry Simpson  . Complication of anesthesia    "quit breathing" -Endoscopy( 12-22-14)  . Fibrocystic breast   . Fracture of right shoulder   . Grave's disease   . History of kidney stones    not a problem at present  . Hypertension   . Obesity   . Osteoporosis    osteopenia    Past Surgical History:  Procedure Laterality Date  . COLONOSCOPY  09/29/2014   Normal, Kerry Edis, MD  . ELECTROPHYSIOLOGIC STUDY N/A 04/12/2016   Procedure: CARDIOVERSION;  Surgeon: Kerry Merritts, MD;  Location: [facility] ORS;  Service: Cardiovascular;  Laterality: N/A;  . ESOPHAGOGASTRODUODENOSCOPY ENDOSCOPY    . EUS N/A 01/12/2015   Procedure: UPPER ENDOSCOPIC ULTRASOUND (EUS) LINEAR;  Surgeon: Kerry Banister, MD;  Location: [facility];  Service: Endoscopy;  Laterality: N/A;  . TUBAL  LIGATION      Family History  Problem Relation Age of Onset  . Hypertension Mother   . Diabetes Father   . Hypertension Father   . Hypertension Brother   . Cancer Brother        prostate  . Heart disease Brother        with bypass  . Colon cancer Neg Hx     Social History Social History   Tobacco Use  . Smoking status: Never Smoker  . Smokeless tobacco: Never Used  Substance Use Topics  . Alcohol use: No    Alcohol/week: 0.0 oz  . Drug use: No    No Known Allergies  Current Outpatient Medications  Medication Sig Dispense Refill  . acetaminophen (TYLENOL) 500 MG tablet Take 1,000 mg by mouth every 6 (six) hours as needed.     Marland Kitchen amiodarone (PACERONE) 200 MG tablet TAKE 1 TABLET BY MOUTH ONCE DAILY 90 tablet 3  . amLODipine (NORVASC) 5 MG tablet TAKE 1 TABLET BY MOUTH ONCE A DAY 30 tablet 6  . Cholecalciferol (VITAMIN D3 PO) Take 400 Units by mouth daily.    . Cyanocobalamin (VITAMIN B 12 PO) Take by mouth daily.    . fosinopril (MONOPRIL) 20 MG tablet Take 1 tablet (20 mg total) by mouth daily. 90 tablet 3  .  furosemide (LASIX) 20 MG tablet Take 1 tablet (20 mg total) by mouth daily as needed. 30 tablet 3  . omeprazole (PRILOSEC) 20 MG capsule Take 1 capsule (20 mg total) by mouth daily. 90 capsule 3  . PARoxetine (PAXIL) 10 MG tablet Take 1 tablet (10 mg total) by mouth daily. 90 tablet 3  . XARELTO 20 MG TABS tablet TAKE 1 TABLET BY MOUTH ONCE A DAY WITH SUPPER 30 tablet 2   No current facility-administered medications for this visit.     Review of Systems Review of Systems  Constitutional: Negative.   Respiratory: Negative.   Cardiovascular: Negative.   Gastrointestinal: Positive for abdominal pain and diarrhea. Negative for nausea and vomiting.    Blood pressure 140/78, pulse 70, resp. rate 20, height 5' (1.524 m), weight 198 lb (89.8 kg), SpO2 98 %. The patient's weight is up 24 pounds from her December 12, 2014 evaluation.  Physical Exam Physical Exam   Constitutional: She is oriented to person, place, and time. She appears well-developed and well-nourished.  HENT:  Mouth/Throat: Oropharynx is clear and moist.  Eyes: Conjunctivae are normal. No scleral icterus.  Neck: Normal range of motion. Neck supple.  Cardiovascular: Normal rate and regular rhythm.  Murmur heard.  Systolic murmur is present with a grade of 2/6. Pulmonary/Chest: Effort normal and breath sounds normal. She exhibits tenderness.  Abdominal: Soft. Normal appearance.  Neurological: She is alert and oriented to person, place, and time.  Skin: Skin is warm and dry.  Psychiatric: Her behavior is normal.    Data Reviewed Imaging studies completed in early 2016 to evaluate her abdominal pain including abdominal ultrasound, CT scan of the abdomen and pelvis and upper GI series. Ultrasound showed a mobile gallstone measuring up to 1.9 cm.  Normal common bile duct.  CT scan showed circumferential wall thickening and luminal narrowing at the junction of the pylorus and the first portion of the duodenum.  No evidence of high-grade gastric outlet obstruction.  Upper GI showed post bulbar thickening and mild narrowing of the second portion of the duodenum consistent with scarring and duodenitis.  Small hiatal hernia.  Upper endoscopy dated December 13, 2014 showed a Schatzki ring.  Small hiatal hernia.  Multiple erosions in the prepyloric area.  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  Colonoscopy completed September 29, 2014 was normal.  Assessment    No clear etiology for her intermittent left sided pain, at times precipitated by change in position.  No indication for repeat imaging at this time.    Plan    Follow up as needed or if pain continues or gets worse Patient instructed to pay attention to activity when pain occures      HPI, Physical Exam, Assessment and Plan have been scribed under the direction and in the presence of Robert Bellow, MD. Kerry Fetch, RN  I have completed the exam and reviewed the above documentation for accuracy and completeness.  I agree with the above.  Haematologist has been used and any errors in dictation or transcription are unintentional.  Hervey Ard, M.D., F.A.C.S.  Forest Gleason Kresha Abelson 03/31/2018, 9:04 PM

## 2018-04-08 ENCOUNTER — Other Ambulatory Visit: Payer: Self-pay | Admitting: Family Medicine

## 2018-04-16 DIAGNOSIS — R69 Illness, unspecified: Secondary | ICD-10-CM | POA: Diagnosis not present

## 2018-05-11 ENCOUNTER — Telehealth: Payer: Self-pay | Admitting: Family Medicine

## 2018-05-11 DIAGNOSIS — I10 Essential (primary) hypertension: Secondary | ICD-10-CM

## 2018-05-11 DIAGNOSIS — E0590 Thyrotoxicosis, unspecified without thyrotoxic crisis or storm: Secondary | ICD-10-CM

## 2018-05-11 DIAGNOSIS — R7309 Other abnormal glucose: Secondary | ICD-10-CM

## 2018-05-11 NOTE — Telephone Encounter (Signed)
-----   Message from Eustace Pen, LPN sent at 06/19/7914  4:17 PM EDT ----- Regarding: Labs 7/3 Lab orders needed. Thank you.  Insurance:  [employer]

## 2018-05-13 ENCOUNTER — Ambulatory Visit (INDEPENDENT_AMBULATORY_CARE_PROVIDER_SITE_OTHER): Payer: Medicare HMO

## 2018-05-13 ENCOUNTER — Ambulatory Visit: Payer: PPO

## 2018-05-13 VITALS — BP 122/70 | HR 74 | Temp 98.7°F | Ht 60.0 in | Wt 196.5 lb

## 2018-05-13 DIAGNOSIS — Z0000 Encounter for general adult medical examination without abnormal findings: Secondary | ICD-10-CM

## 2018-05-13 DIAGNOSIS — R7309 Other abnormal glucose: Secondary | ICD-10-CM

## 2018-05-13 DIAGNOSIS — I10 Essential (primary) hypertension: Secondary | ICD-10-CM

## 2018-05-13 DIAGNOSIS — E0590 Thyrotoxicosis, unspecified without thyrotoxic crisis or storm: Secondary | ICD-10-CM | POA: Diagnosis not present

## 2018-05-13 DIAGNOSIS — H269 Unspecified cataract: Secondary | ICD-10-CM

## 2018-05-13 HISTORY — DX: Unspecified cataract: H26.9

## 2018-05-13 LAB — LIPID PANEL
CHOL/HDL RATIO: 3
Cholesterol: 153 mg/dL (ref 0–200)
HDL: 50.4 mg/dL (ref >39.00)
LDL CALC: 77 mg/dL (ref 0–99)
NonHDL: 102.61
TRIGLYCERIDES: 127 mg/dL (ref 0.0–149.0)
VLDL: 25.4 mg/dL (ref 0.0–40.0)

## 2018-05-13 LAB — HEMOGLOBIN A1C: Hgb A1c MFr Bld: 6.2 % (ref 4.6–6.5)

## 2018-05-13 LAB — COMPREHENSIVE METABOLIC PANEL
ALBUMIN: 4.1 g/dL (ref 3.5–5.2)
ALT: 14 U/L (ref 0–35)
AST: 15 U/L (ref 0–37)
Alkaline Phosphatase: 80 U/L (ref 39–117)
BUN: 15 mg/dL (ref 6–23)
CALCIUM: 9.1 mg/dL (ref 8.4–10.5)
CHLORIDE: 105 meq/L (ref 96–112)
CO2: 27 mEq/L (ref 19–32)
CREATININE: 1.15 mg/dL (ref 0.40–1.20)
GFR: 49.19 mL/min — ABNORMAL LOW (ref >60.00)
Glucose, Bld: 101 mg/dL — ABNORMAL HIGH (ref 70–99)
Potassium: 4.5 mEq/L (ref 3.5–5.1)
Sodium: 141 mEq/L (ref 135–145)
Total Bilirubin: 0.4 mg/dL (ref 0.2–1.2)
Total Protein: 6.7 g/dL (ref 6.0–8.3)

## 2018-05-13 LAB — CBC WITH DIFFERENTIAL/PLATELET
BASOS ABS: 0.1 10*3/uL (ref 0.0–0.1)
BASOS PCT: 1.3 % (ref 0.0–3.0)
EOS ABS: 0.1 10*3/uL (ref 0.0–0.7)
Eosinophils Relative: 1.7 % (ref 0.0–5.0)
HEMATOCRIT: 37.1 % (ref 36.0–46.0)
HEMOGLOBIN: 11.8 g/dL — AB (ref 12.0–15.0)
Lymphocytes Relative: 24.1 % (ref 12.0–46.0)
Lymphs Abs: 1.5 10*3/uL (ref 0.7–4.0)
MCHC: 31.8 g/dL (ref 30.0–36.0)
MCV: 78.6 fl (ref 78.0–100.0)
MONOS PCT: 8.9 % (ref 3.0–12.0)
Monocytes Absolute: 0.6 10*3/uL (ref 0.1–1.0)
Neutro Abs: 4.1 10*3/uL (ref 1.4–7.7)
Neutrophils Relative %: 64 % (ref 43.0–77.0)
Platelets: 300 10*3/uL (ref 150.0–400.0)
RBC: 4.72 Mil/uL (ref 3.87–5.11)
RDW: 16.4 % — AB (ref 11.5–15.5)
WBC: 6.3 10*3/uL (ref 4.0–10.5)

## 2018-05-13 LAB — TSH: TSH: 1.1 u[IU]/mL (ref 0.35–4.50)

## 2018-05-13 NOTE — Progress Notes (Signed)
Subjective:   Kerry Simpson is a 72 y.o. female who presents for Medicare Annual (Subsequent) preventive examination.  Review of Systems:  N/A Cardiac Risk Factors include: advanced age (>78men, >70 women);obesity (BMI >30kg/m2);hypertension     Objective:     Vitals: BP 122/70 (BP Location: Right Arm, Patient Position: Sitting, Cuff Size: Normal)   Pulse 74   Temp 98.7 F (37.1 C) (Oral)   Ht 5' (1.524 m) Comment: no shoes  Wt 196 lb 8 oz (89.1 kg)   SpO2 92%   BMI 38.38 kg/m   Body mass index is 38.38 kg/m.  Advanced Directives 05/13/2018 05/07/2017 04/12/2016 01/12/2015 09/15/2014  Does Patient Have a Medical Advance Directive? Yes Yes Yes Yes Yes  Type of Paramedic of [place];Living will [place];Living will - Living will;Healthcare Power of [place];Living will  Does patient want to make changes to medical advance directive? - - No - Patient declined - -  Copy of [place] in Chart? No - copy requested No - copy requested No - copy requested No - copy requested -    Tobacco Social History   Tobacco Use  Smoking Status Never Smoker  Smokeless Tobacco Never Used     Counseling given: No   Clinical Intake:  Pre-visit preparation completed: Yes  Pain : No/denies pain Pain Score: 0-No pain     Nutritional Status: BMI > 30  Obese Nutritional Risks: None Diabetes: No  How often do you need to have someone help you when you read instructions, pamphlets, or other written materials from your doctor or pharmacy?: 1 - Never What is the last grade level you completed in school?: Bachelors degree  Interpreter Needed?: No  Comments: pt lives with spouse Information entered by :: LPinson, LPN  Past Medical History:  Diagnosis Date  . Anemia    iron def  . Atrial fibrillation ([place])    Dr Rockey Situ  . Cataract 05/13/2018   both eyes per last vision exam   . Complication of  anesthesia    "quit breathing" -Endoscopy( 12-22-14)  . Fibrocystic breast   . Fracture of right shoulder   . Grave's disease   . History of kidney stones    not a problem at present  . Hypertension   . Obesity   . Osteoporosis    osteopenia   Past Surgical History:  Procedure Laterality Date  . COLONOSCOPY  09/29/2014   Normal, Delfin Edis, MD  . ELECTROPHYSIOLOGIC STUDY N/A 04/12/2016   Procedure: CARDIOVERSION;  Surgeon: Minna Merritts, MD;  Location: [facility] OR[facility];  Service: Cardiovascular;  Laterality: N/A;  . ESOPHAGOGASTRODUODENOSCOPY ENDOSCOPY    . EUS N/A 01/12/2015   Procedure: UPPER ENDOSCOPIC ULTRASOUND (EUS) LINEAR;  Surgeon: Milus Banister, MD;  Location: [facility];  Service: Endoscopy;  Laterality: N/A;  . TUBAL LIGATION     Family History  Problem Relation Age of Onset  . Hypertension Mother   . Diabetes Father   . Hypertension Father   . Hypertension Brother   . Cancer Brother        prostate  . Heart disease Brother        with bypass  . Colon cancer Neg Hx    Social History   Socioeconomic History  . Marital status: Married    Spouse name: Not on file  . Number of children: Not on file  . Years of education: Not on file  .  Highest education level: Not on file  Occupational History  . Occupation: Retired    Fish farm manager: RETIRED  Social Needs  . Financial resource strain: Not on file  . Food insecurity:    Worry: Not on file    Inability: Not on file  . Transportation needs:    Medical: Not on file    Non-medical: Not on file  Tobacco Use  . Smoking status: Never Smoker  . Smokeless tobacco: Never Used  Substance and Sexual Activity  . Alcohol use: No    Alcohol/week: 0.0 oz  . Drug use: No  . Sexual activity: Not Currently  Lifestyle  . Physical activity:    Days per week: Not on file    Minutes per session: Not on file  . Stress: Not on file  Relationships  . Social connections:    Talks on phone: Not on file    Gets together: Not on  file    Attends religious service: Not on file    Active member of club or organization: Not on file    Attends meetings of clubs or organizations: Not on file    Relationship status: Not on file  Other Topics Concern  . Not on file  Social History Narrative  . Not on file    Outpatient Encounter Medications as of 05/13/2018  Medication Sig  . acetaminophen (TYLENOL) 500 MG tablet Take 1,000 mg by mouth every 6 (six) hours as needed.   Marland Kitchen amiodarone (PACERONE) 200 MG tablet TAKE 1 TABLET BY MOUTH ONCE DAILY  . amLODipine (NORVASC) 5 MG tablet TAKE 1 TABLET BY MOUTH ONCE A DAY  . Cholecalciferol (VITAMIN D3 PO) Take 400 Units by mouth daily.  . Cyanocobalamin (VITAMIN B 12 PO) Take by mouth daily.  . fosinopril (MONOPRIL) 20 MG tablet Take 1 tablet (20 mg total) by mouth daily.  . furosemide (LASIX) 20 MG tablet Take 1 tablet (20 mg total) by mouth daily as needed.  Marland Kitchen omeprazole (PRILOSEC) 20 MG capsule TAKE 1 CAPSULE BY MOUTH ONCE DAILY  . PARoxetine (PAXIL) 10 MG tablet Take 1 tablet (10 mg total) by mouth daily.  Alveda Reasons 20 MG TABS tablet TAKE 1 TABLET BY MOUTH ONCE A DAY WITH SUPPER   No facility-administered encounter medications on file as of 05/13/2018.     Activities of Daily Living In your present state of health, do you have any difficulty performing the following activities: 05/13/2018  Hearing? N  Vision? N  Difficulty concentrating or making decisions? N  Walking or climbing stairs? N  Dressing or bathing? N  Doing errands, shopping? N  Preparing Food and eating ? N  Using the Toilet? N  In the past six months, have you accidently leaked urine? N  Do you have problems with loss of bowel control? N  Managing your Medications? N  Managing your Finances? N  Housekeeping or managing your Housekeeping? N  Some recent data might be hidden    Patient Care Team: Tower, Wynelle Fanny, MD as PCP - General Tower, Wynelle Fanny, MD as Consulting Physician (Family Medicine) Bary Castilla,  Forest Gleason, MD (General Surgery) Minna Merritts, MD as Consulting Physician (Cardiology)    Assessment:   This is a routine wellness examination for Kerry Simpson.   Hearing Screening   125Hz  250Hz  500Hz  1000Hz  2000Hz  3000Hz  4000Hz  6000Hz  8000Hz   Right ear:   40 40 40  40    Left ear:   40 40 40  40    Vision  Screening Comments: Vision exam in 2018 with Dr. Annamaria Helling  Exercise Activities and Dietary recommendations Current Exercise Habits: The patient does not participate in regular exercise at present, Exercise limited by: None identified  Goals    . Patient Stated     Starting 05/13/2018, I will continue to take medications as prescribed.        Fall Risk Fall Risk  05/13/2018 05/07/2017 10/13/2015 04/18/2014 04/14/2013  Falls in the past year? No No No No Yes  Number falls in past yr: - - - - 1   Depression Screen PHQ 2/9 Scores 05/13/2018 05/07/2017 10/13/2015 04/18/2014  PHQ - 2 Score 0 0 0 0  PHQ- 9 Score 0 - - -     Cognitive Function MMSE - Mini Mental State Exam 05/13/2018 05/07/2017  Orientation to time 5 5  Orientation to Place 5 5  Registration 3 3  Attention/ Calculation 0 0  Recall 3 3  Language- name 2 objects 0 0  Language- repeat 1 1  Language- follow 3 step command 3 3  Language- read & follow direction 0 0  Write a sentence 0 0  Copy design 0 0  Total score 20 20       PLEASE NOTE: A Mini-Cog screen was completed. Maximum score is 20. A value of 0 denotes this part of [place] MMSE was not completed or the patient failed this part of the Mini-Cog screening.   Mini-Cog Screening Orientation to Time - Max 5 pts Orientation to Place - Max 5 pts Registration - Max 3 pts Recall - Max 3 pts Language Repeat - Max 1 pts Language Follow 3 Step Command - Max 3 pts   Immunization History  Administered Date(s) Administered  . Influenza Split 08/01/2011, 01/08/2012  . Influenza Whole 09/25/2007, 08/22/2008, 10/12/2009, 07/11/2010, 08/18/2012  . Influenza,inj,Quad PF,6+  Mos 10/13/2015, 08/20/2016  . Influenza-Unspecified 09/22/2017  . Pneumococcal Conjugate-13 04/18/2014  . Pneumococcal Polysaccharide-23 09/28/2010  . Zoster 11/12/2013    Screening Tests Health Maintenance  Topic Date Due  . MAMMOGRAM  11/10/2018 (Originally 10/30/2017)  . PAP SMEAR  05/08/2019 (Originally 04/15/2015)  . TETANUS/TDAP  05/14/2019 (Originally 07/20/2013)  . INFLUENZA VACCINE  06/11/2018  . COLONOSCOPY  09/29/2024  . DEXA SCAN  Completed  . Hepatitis C Screening  Completed  . PNA vac Low Risk Adult  Completed      Plan:   I have personally reviewed, addressed, and noted the following in the patient's chart:  A. Medical and social history B. Use of alcohol, tobacco or illicit drugs  C. Current medications and supplements D. Functional ability and status E.  Nutritional status F.  Physical activity G. Advance directives H. List of other physicians I.  Hospitalizations, surgeries, and ER visits in previous 12 months J.  [place] to include hearing, vision, cognitive, depression L. Referrals and appointments - none  In addition, I have reviewed and discussed with patient certain preventive protocols, quality metrics, and best practice recommendations. A written personalized care plan for preventive services as well as general preventive health recommendations were provided to patient.  See attached scanned questionnaire for additional information.   Signed,   Lindell Noe, MHA, BS, LPN Health Coach

## 2018-05-13 NOTE — Progress Notes (Signed)
PCP notes:   Health maintenance:  Mammogram - addresssed Tetanus vaccine - postponed/insurance  Abnormal screenings:   None  Patient concerns:   Patient reports intermittent pain under left breast in rib region. Has consulted with Dr. Charissa Bash regarding this issue.   Nurse concerns:  None  Next PCP appt:   05/20/2018 @ 1030  I reviewed health advisor's note, was available for consultation, and agree with documentation and plan. Loura Pardon MD

## 2018-05-13 NOTE — Patient Instructions (Signed)
 Preventive Care for Adults  A healthy lifestyle and preventive care can promote health and wellness. Preventive health guidelines for adults include the following key practices.  . A routine yearly physical is a good way to check with your health care provider about your health and preventive screening. It is a chance to share any concerns and updates on your health and to receive a thorough exam.  . Visit your dentist for a routine exam and preventive care every 6 months. Brush your teeth twice a day and floss once a day. Good oral hygiene prevents tooth decay and gum disease.  . The frequency of eye exams is based on your age, health, family medical history, use  of contact lenses, and other factors. Follow your health care provider's recommendations for frequency of eye exams.  . Eat a healthy diet. Foods like vegetables, fruits, whole grains, low-fat dairy products, and lean protein foods contain the nutrients you need without too many calories. Decrease your intake of foods high in solid fats, added sugars, and salt. Eat the right amount of calories for you. Get information about a proper diet from your health care provider, if necessary.  . Regular physical exercise is one of the most important things you can do for your health. Most adults should get at least 150 minutes of moderate-intensity exercise (any activity that increases your heart rate and causes you to sweat) each week. In addition, most adults need muscle-strengthening exercises on 2 or more days a week.  Silver Sneakers may be a benefit available to you. To determine eligibility, you may visit the website: www.silversneakers.com or contact program at 1-866-584-7389 Mon-Fri between 8AM-8PM.   . Maintain a healthy weight. The body mass index (BMI) is a screening tool to identify possible weight problems. It provides an estimate of body fat based on height and weight. Your health care provider can find your BMI and can help you  achieve or maintain a healthy weight.   For adults 20 years and older: ? A BMI below 18.5 is considered underweight. ? A BMI of 18.5 to 24.9 is normal. ? A BMI of 25 to 29.9 is considered overweight. ? A BMI of 30 and above is considered obese.   . Maintain normal blood lipids and cholesterol levels by exercising and minimizing your intake of saturated fat. Eat a balanced diet with plenty of fruit and vegetables. Blood tests for lipids and cholesterol should begin at age 20 and be repeated every 5 years. If your lipid or cholesterol levels are high, you are over 50, or you are at high risk for heart disease, you may need your cholesterol levels checked more frequently. Ongoing high lipid and cholesterol levels should be treated with medicines if diet and exercise are not working.  . If you smoke, find out from your health care provider how to quit. If you do not use tobacco, please do not start.  . If you choose to drink alcohol, please do not consume more than 2 drinks per day. One drink is considered to be 12 ounces (355 mL) of beer, 5 ounces (148 mL) of wine, or 1.5 ounces (44 mL) of liquor.  . If you are 55-79 years old, ask your health care provider if you should take aspirin to prevent strokes.  . Use sunscreen. Apply sunscreen liberally and repeatedly throughout the day. You should seek shade when your shadow is shorter than you. Protect yourself by wearing long sleeves, pants, a wide-brimmed hat, and sunglasses   year round, whenever you are outdoors.  . Once a month, do a whole body skin exam, using a mirror to look at the skin on your back. Tell your health care provider of new moles, moles that have irregular borders, moles that are larger than a pencil eraser, or moles that have changed in shape or color.     

## 2018-05-20 ENCOUNTER — Ambulatory Visit (INDEPENDENT_AMBULATORY_CARE_PROVIDER_SITE_OTHER): Payer: Medicare HMO | Admitting: Family Medicine

## 2018-05-20 ENCOUNTER — Encounter: Payer: Self-pay | Admitting: Family Medicine

## 2018-05-20 DIAGNOSIS — I10 Essential (primary) hypertension: Secondary | ICD-10-CM

## 2018-05-20 DIAGNOSIS — Z0000 Encounter for general adult medical examination without abnormal findings: Secondary | ICD-10-CM

## 2018-05-20 DIAGNOSIS — E2839 Other primary ovarian failure: Secondary | ICD-10-CM

## 2018-05-20 DIAGNOSIS — E6609 Other obesity due to excess calories: Secondary | ICD-10-CM | POA: Diagnosis not present

## 2018-05-20 DIAGNOSIS — Z1231 Encounter for screening mammogram for malignant neoplasm of breast: Secondary | ICD-10-CM

## 2018-05-20 DIAGNOSIS — Z6838 Body mass index (BMI) 38.0-38.9, adult: Secondary | ICD-10-CM

## 2018-05-20 DIAGNOSIS — I5030 Unspecified diastolic (congestive) heart failure: Secondary | ICD-10-CM

## 2018-05-20 DIAGNOSIS — D649 Anemia, unspecified: Secondary | ICD-10-CM

## 2018-05-20 DIAGNOSIS — E0590 Thyrotoxicosis, unspecified without thyrotoxic crisis or storm: Secondary | ICD-10-CM

## 2018-05-20 DIAGNOSIS — I4891 Unspecified atrial fibrillation: Secondary | ICD-10-CM | POA: Diagnosis not present

## 2018-05-20 DIAGNOSIS — I4892 Unspecified atrial flutter: Secondary | ICD-10-CM

## 2018-05-20 DIAGNOSIS — R7309 Other abnormal glucose: Secondary | ICD-10-CM

## 2018-05-20 DIAGNOSIS — M8580 Other specified disorders of bone density and structure, unspecified site: Secondary | ICD-10-CM | POA: Diagnosis not present

## 2018-05-20 MED ORDER — OMEPRAZOLE 20 MG PO CPDR: 20.0000 mg | DELAYED_RELEASE_CAPSULE | Freq: Every day | ORAL | 3 refills | Status: DC

## 2018-05-20 MED ORDER — FOSINOPRIL SODIUM 20 MG PO TABS: 20.0000 mg | ORAL_TABLET | Freq: Every day | ORAL | 3 refills | Status: DC

## 2018-05-20 MED ORDER — PAROXETINE HCL 10 MG PO TABS: 10.0000 mg | ORAL_TABLET | Freq: Every day | ORAL | 3 refills | Status: DC

## 2018-05-20 NOTE — Progress Notes (Signed)
Subjective:    Patient ID: Kerry Simpson, female    DOB: [birth year], 72 y.o.   MRN: [id number]  HPI Here for health maintenance exam and to review chronic medical problems    Feels pretty good  Being a grandmother/ household stuff    Wt Readings from Last 3 Encounters:  05/20/18 199 lb 4 oz (90.4 kg)  05/13/18 196 lb 8 oz (89.1 kg)  03/31/18 198 lb (89.8 kg)  wt is up 5 lb  Diet -- too much potato/carb/fried food / sweets  Does not want to exercise/ hates it  38.91 kg/m   Would like to loose weight  Has a diabetic husband - on insulin pump    Had amw on 7/3 No concerns  Mentioned pain under L breast in ribs and has consulted Dr Marlyn Corporal  Watching that / no answers   Mammogram 12/17- has not had it  Self breast exam - no lumps   Colonoscopy 11/15 nl with 10 y recall   dexa 12/16-osteopenia  Wants to schedule dexa  Takes vit D No falls or fx   zostavax 1/15  bp is stable today  No cp or palpitations or headaches or edema  No side effects to medicines  BP Readings from Last 3 Encounters:  05/20/18 128/74  05/13/18 122/70  03/31/18 140/78     Hx of hyperthyroidism Lab Results  Component Value Date   TSH 1.10 05/13/2018    Hyperglycemia Lab Results  Component Value Date   HGBA1C 6.2 05/13/2018  up from 6.0  Terrible diet Refuses to exercise   Cholesterol Lab Results  Component Value Date   CHOL 153 05/13/2018   CHOL 154 05/07/2017   CHOL 167 08/20/2016   Lab Results  Component Value Date   HDL 50.40 05/13/2018   HDL 45.90 05/07/2017   HDL 49.00 08/20/2016   Lab Results  Component Value Date   LDLCALC 77 05/13/2018   LDLCALC 81 05/07/2017   LDLCALC 101 (H) 08/20/2016   Lab Results  Component Value Date   TRIG 127.0 05/13/2018   TRIG 134.0 05/07/2017   TRIG 87.0 08/20/2016   Lab Results  Component Value Date   CHOLHDL 3 05/13/2018   CHOLHDL 3 05/07/2017   CHOLHDL 3 08/20/2016   No results found for: LDLDIRECT  Good genetics  for cholesterol  She eats poorly   Lab Results  Component Value Date   WBC 6.3 05/13/2018   HGB 11.8 (L) 05/13/2018   HCT 37.1 05/13/2018   MCV 78.6 05/13/2018   PLT 300.0 05/13/2018    New anemia  ? Why   Lab Results  Component Value Date   CREATININE 1.15 05/13/2018   BUN 15 05/13/2018   NA 141 05/13/2018   K 4.5 05/13/2018   CL 105 05/13/2018   CO2 27 05/13/2018   Lab Results  Component Value Date   ALT 14 05/13/2018   AST 15 05/13/2018   ALKPHOS 80 05/13/2018   BILITOT 0.4 05/13/2018     Patient Active Problem List   Diagnosis Date Noted  . Screening mammogram, encounter for 05/20/2018  . Generalized abdominal pain 03/31/2018  . (HFpEF) heart failure with preserved ejection fraction ([place]) 05/19/2017  . GERD (gastroesophageal reflux disease) 08/20/2016  . DOE (dyspnea on exertion) 05/28/2016  . Sleep disturbance 05/28/2016  . Somnolence, daytime 05/09/2016  . Encounter for anticoagulation discussion and counseling 02/15/2016  . Atrial fibrillation and flutter ([place]) 02/14/2016  . Routine general medical examination at a  health care facility 10/13/2015  . Estrogen deficiency 10/13/2015  . Fatigue 08/25/2015  . History of melena 12/12/2014  . Gallstone 12/07/2014  . RUQ pain 12/05/2014  . Encounter for routine gynecological examination 04/14/2013  . Encounter for Medicare annual wellness exam 03/30/2013  . Other screening mammogram 01/08/2012  . STRESS REACTION, ACUTE, WITH EMOTIONAL DISTURBANCE 11/26/2010  . HYPERGLYCEMIA 09/28/2010  . Osteopenia 06/21/2009  . Hyperthyroidism 04/26/2008  . Obesity 04/26/2008  . Essential hypertension 04/26/2008   Past Medical History:  Diagnosis Date  . Anemia    iron def  . Atrial fibrillation ([place])    Dr Rockey Situ  . Cataract 05/13/2018   both eyes per last vision exam   . Complication of anesthesia    "quit breathing" -Endoscopy( 12-22-14)  . Fibrocystic breast   . Fracture of right shoulder   . Grave's disease     . History of kidney stones    not a problem at present  . Hypertension   . Obesity   . Osteoporosis    osteopenia   Past Surgical History:  Procedure Laterality Date  . COLONOSCOPY  09/29/2014   Normal, Delfin Edis, MD  . ELECTROPHYSIOLOGIC STUDY N/A 04/12/2016   Procedure: CARDIOVERSION;  Surgeon: Minna Merritts, MD;  Location: [facility] ORS;  Service: Cardiovascular;  Laterality: N/A;  . ESOPHAGOGASTRODUODENOSCOPY ENDOSCOPY    . EUS N/A 01/12/2015   Procedure: UPPER ENDOSCOPIC ULTRASOUND (EUS) LINEAR;  Surgeon: Milus Banister, MD;  Location: [facility];  Service: Endoscopy;  Laterality: N/A;  . TUBAL LIGATION     Social History   Tobacco Use  . Smoking status: Never Smoker  . Smokeless tobacco: Never Used  Substance Use Topics  . Alcohol use: No    Alcohol/week: 0.0 oz  . Drug use: No   Family History  Problem Relation Age of Onset  . Hypertension Mother   . Diabetes Father   . Hypertension Father   . Hypertension Brother   . Cancer Brother        prostate  . Heart disease Brother        with bypass  . Colon cancer Neg Hx    No Known Allergies Current Outpatient Medications on File Prior to Visit  Medication Sig Dispense Refill  . acetaminophen (TYLENOL) 500 MG tablet Take 1,000 mg by mouth daily as needed.     Marland Kitchen amiodarone (PACERONE) 200 MG tablet TAKE 1 TABLET BY MOUTH ONCE DAILY 90 tablet 3  . amLODipine (NORVASC) 5 MG tablet TAKE 1 TABLET BY MOUTH ONCE A DAY 30 tablet 6  . Cholecalciferol (VITAMIN D3 PO) Take 400 Units by mouth daily.    . Cyanocobalamin (VITAMIN B 12 PO) Take by mouth daily.    . furosemide (LASIX) 20 MG tablet Take 1 tablet (20 mg total) by mouth daily as needed. 30 tablet 3  . XARELTO 20 MG TABS tablet TAKE 1 TABLET BY MOUTH ONCE A DAY WITH SUPPER 30 tablet 2   No current facility-administered medications on file prior to visit.      Review of Systems  Constitutional: Negative for activity change, appetite change, fatigue, fever and  unexpected weight change.  HENT: Negative for congestion, ear pain, rhinorrhea, sinus pressure and sore throat.   Eyes: Negative for pain, redness and visual disturbance.  Respiratory: Negative for cough, shortness of breath and wheezing.   Cardiovascular: Negative for chest pain and palpitations.  Gastrointestinal: Negative for abdominal pain, blood in stool, constipation and diarrhea.  Endocrine: Negative  for polydipsia and polyuria.  Genitourinary: Negative for dysuria, frequency and urgency.  Musculoskeletal: Negative for arthralgias, back pain and myalgias.  Skin: Negative for pallor and rash.  Allergic/Immunologic: Negative for environmental allergies.  Neurological: Negative for dizziness, syncope and headaches.  Hematological: Negative for adenopathy. Does not bruise/bleed easily.  Psychiatric/Behavioral: Negative for decreased concentration and dysphoric mood. The patient is not nervous/anxious.        Objective:   Physical Exam  Constitutional: She appears well-developed and well-nourished. No distress.  obese and well appearing   HENT:  Head: Normocephalic and atraumatic.  Right Ear: External ear normal.  Left Ear: External ear normal.  Mouth/Throat: Oropharynx is clear and moist.  Eyes: Pupils are equal, round, and reactive to light. Conjunctivae and EOM are normal. No scleral icterus.  Neck: Normal range of motion. Neck supple. No JVD present. Carotid bruit is not present. No thyromegaly present.  Cardiovascular: Normal rate, regular rhythm, normal heart sounds and intact distal pulses. Exam reveals no gallop.  Pulmonary/Chest: Effort normal and breath sounds normal. No respiratory distress. She has no wheezes. She exhibits no tenderness. No breast tenderness, discharge or bleeding.  Abdominal: Soft. Bowel sounds are normal. She exhibits no distension, no abdominal bruit and no mass. There is no tenderness.  Genitourinary: No breast tenderness, discharge or bleeding.    Genitourinary Comments: Breast exam: No mass, nodules, thickening, tenderness, bulging, retraction, inflamation, nipple discharge or skin changes noted.  No axillary or clavicular LA.      Musculoskeletal: Normal range of motion. She exhibits no edema or tenderness.  Lymphadenopathy:    She has no cervical adenopathy.  Neurological: She is alert. She has normal reflexes. No cranial nerve deficit. She exhibits normal muscle tone. Coordination normal.  Skin: Skin is warm and dry. No rash noted. No erythema. No pallor.  Psychiatric: She has a normal mood and affect.          Assessment & Plan:   Problem List Items Addressed This Visit      Cardiovascular and Mediastinum   (HFpEF) heart failure with preserved ejection fraction (HCC)    Stable/doing well       Relevant Medications   fosinopril (MONOPRIL) 20 MG tablet   Atrial fibrillation and flutter ([place])    Continue cardiology f/u and xarelto       Relevant Medications   fosinopril (MONOPRIL) 20 MG tablet   Essential hypertension    bp in fair control at this time  BP Readings from Last 1 Encounters:  05/20/18 128/74   No changes needed Most recent labs reviewed  Disc lifstyle change with low sodium diet and exercise        Relevant Medications   fosinopril (MONOPRIL) 20 MG tablet     Endocrine   Hyperthyroidism     Pt has no clinical changes No change in energy level/ hair or skin/ edema and no tremor Lab Results  Component Value Date   TSH 1.10 05/13/2018            Musculoskeletal and Integument   Osteopenia    Rev dexa 2016 Ordered another  On vit D No falls/fx Dislikes exercise         Other   Estrogen deficiency   Relevant Orders   DG Bone Density   HYPERGLYCEMIA    Lab Results  Component Value Date   HGBA1C 6.2 05/13/2018   disc imp of low glycemic diet and wt loss to prevent DM2  Obesity    Discussed how this problem influences overall health and the risks it imposes  Reviewed  plan for weight loss with lower calorie diet (via better food choices and also portion control or program like weight watchers) and exercise building up to or more than 30 minutes 5 days per week including some aerobic activity   Pt dislikes exercise       Routine general medical examination at a health care facility - Primary    Reviewed health habits including diet and exercise and skin cancer prevention Reviewed appropriate screening tests for age  Also reviewed health mt list, fam hx and immunization status , as well as social and family history   See HPI Labs rev  slt anemia  Will re check 1 mo      Screening mammogram, encounter for    Scheduled annual screening mammogram Nl breast exam today  Encouraged monthly self exams        Relevant Orders   MM DIGITAL SCREENING BILATERAL

## 2018-05-20 NOTE — Patient Instructions (Addendum)
To prevent diabetes and also loose weight  Try to get most of your carbohydrates from produce (with the exception of white potatoes)  Eat less bread/pasta/rice/snack foods/cereals/sweets and other items from the middle of the grocery store (processed carbs)   Weight watchers is a great option  You would do better with exercise -but I know you do not like to exercise   We will refer for dexa and mammogram   You are a little anemic  Re check labs in a month with some iron studies  We will also sign you up for cologuard - colon cancer screening program

## 2018-05-21 ENCOUNTER — Encounter: Payer: Self-pay | Admitting: Family Medicine

## 2018-05-23 DIAGNOSIS — D509 Iron deficiency anemia, unspecified: Secondary | ICD-10-CM | POA: Insufficient documentation

## 2018-05-23 NOTE — Assessment & Plan Note (Signed)
Stable doing well. 

## 2018-05-23 NOTE — Assessment & Plan Note (Signed)
Hb 11.8  No symptoms or etiology Nl colonoscopy 2015 Given cologuard kit  Also re check cbc with ferritin 1 mo

## 2018-05-23 NOTE — Assessment & Plan Note (Signed)
Continue cardiology f/u and xarelto

## 2018-05-23 NOTE — Assessment & Plan Note (Signed)
Lab Results  Component Value Date   HGBA1C 6.2 05/13/2018   disc imp of low glycemic diet and wt loss to prevent DM2

## 2018-05-23 NOTE — Assessment & Plan Note (Signed)
Scheduled annual screening mammogram Nl breast exam today  Encouraged monthly self exams   

## 2018-05-23 NOTE — Assessment & Plan Note (Signed)
bp in fair control at this time  BP Readings from Last 1 Encounters:  05/20/18 128/74   No changes needed Most recent labs reviewed  Disc lifstyle change with low sodium diet and exercise

## 2018-05-23 NOTE — Assessment & Plan Note (Signed)
  Pt has no clinical changes No change in energy level/ hair or skin/ edema and no tremor Lab Results  Component Value Date   TSH 1.10 05/13/2018

## 2018-05-23 NOTE — Assessment & Plan Note (Signed)
Rev dexa 2016 Ordered another  On vit D No falls/fx Dislikes exercise

## 2018-05-23 NOTE — Assessment & Plan Note (Signed)
Discussed how this problem influences overall health and the risks it imposes  Reviewed plan for weight loss with lower calorie diet (via better food choices and also portion control or program like weight watchers) and exercise building up to or more than 30 minutes 5 days per week including some aerobic activity   Pt dislikes exercise

## 2018-05-23 NOTE — Assessment & Plan Note (Signed)
Reviewed health habits including diet and exercise and skin cancer prevention Reviewed appropriate screening tests for age  Also reviewed health mt list, fam hx and immunization status , as well as social and family history   See HPI Labs rev  slt anemia  Will re check 1 mo

## 2018-05-28 DIAGNOSIS — Z1211 Encounter for screening for malignant neoplasm of colon: Secondary | ICD-10-CM | POA: Diagnosis not present

## 2018-05-28 DIAGNOSIS — Z1212 Encounter for screening for malignant neoplasm of rectum: Secondary | ICD-10-CM | POA: Diagnosis not present

## 2018-05-29 LAB — COLOGUARD: Cologuard: NEGATIVE

## 2018-06-05 ENCOUNTER — Other Ambulatory Visit: Payer: Self-pay | Admitting: Cardiovascular Disease

## 2018-06-05 NOTE — Telephone Encounter (Signed)
Please review for refill, Thanks !  

## 2018-06-17 ENCOUNTER — Ambulatory Visit
Admission: RE | Admit: 2018-06-17 | Discharge: 2018-06-17 | Disposition: A | Discharge status: Home / Self Care | Payer: Medicare HMO | Source: Ambulatory Visit | Attending: Family Medicine | Admitting: Family Medicine

## 2018-06-17 DIAGNOSIS — Z780 Asymptomatic menopausal state: Secondary | ICD-10-CM | POA: Diagnosis not present

## 2018-06-17 DIAGNOSIS — E2839 Other primary ovarian failure: Secondary | ICD-10-CM | POA: Insufficient documentation

## 2018-06-17 DIAGNOSIS — M85852 Other specified disorders of bone density and structure, left thigh: Secondary | ICD-10-CM | POA: Diagnosis not present

## 2018-06-18 ENCOUNTER — Ambulatory Visit
Admission: RE | Admit: 2018-06-18 | Discharge: 2018-06-18 | Disposition: A | Discharge status: Home / Self Care | Payer: Medicare HMO | Source: Ambulatory Visit | Attending: Family Medicine | Admitting: Family Medicine

## 2018-06-18 DIAGNOSIS — Z1231 Encounter for screening mammogram for malignant neoplasm of breast: Secondary | ICD-10-CM

## 2018-06-19 ENCOUNTER — Encounter: Payer: Self-pay | Admitting: Family Medicine

## 2018-06-19 ENCOUNTER — Encounter: Payer: Self-pay | Admitting: *Deleted

## 2018-06-23 ENCOUNTER — Other Ambulatory Visit (INDEPENDENT_AMBULATORY_CARE_PROVIDER_SITE_OTHER): Payer: Medicare HMO

## 2018-06-23 DIAGNOSIS — D649 Anemia, unspecified: Secondary | ICD-10-CM | POA: Diagnosis not present

## 2018-06-23 LAB — CBC WITH DIFFERENTIAL/PLATELET
Basophils Absolute: 0.1 10*3/uL (ref 0.0–0.1)
Basophils Relative: 1.2 % (ref 0.0–3.0)
EOS ABS: 0.2 10*3/uL (ref 0.0–0.7)
EOS PCT: 2.8 % (ref 0.0–5.0)
HCT: 35 % — ABNORMAL LOW (ref 36.0–46.0)
Hemoglobin: 11.2 g/dL — ABNORMAL LOW (ref 12.0–15.0)
LYMPHS ABS: 1.5 10*3/uL (ref 0.7–4.0)
Lymphocytes Relative: 24.8 % (ref 12.0–46.0)
MCHC: 32.1 g/dL (ref 30.0–36.0)
MCV: 75.8 fl — ABNORMAL LOW (ref 78.0–100.0)
MONO ABS: 0.5 10*3/uL (ref 0.1–1.0)
Monocytes Relative: 8.6 % (ref 3.0–12.0)
NEUTROS PCT: 62.6 % (ref 43.0–77.0)
Neutro Abs: 3.8 10*3/uL (ref 1.4–7.7)
PLATELETS: 282 10*3/uL (ref 150.0–400.0)
RBC: 4.61 Mil/uL (ref 3.87–5.11)
RDW: 16.3 % — AB (ref 11.5–15.5)
WBC: 6.1 10*3/uL (ref 4.0–10.5)

## 2018-06-23 LAB — FERRITIN: FERRITIN: 6.7 ng/mL — AB (ref 10.0–291.0)

## 2018-06-26 ENCOUNTER — Encounter: Payer: Self-pay | Admitting: Family Medicine

## 2018-06-29 ENCOUNTER — Telehealth: Payer: Self-pay | Admitting: Family Medicine

## 2018-06-29 DIAGNOSIS — D509 Iron deficiency anemia, unspecified: Secondary | ICD-10-CM

## 2018-06-29 NOTE — Telephone Encounter (Signed)
Spoke with patient and due to patient's preference for a morning appointment got her in with [facility] on 07/16/18 at 9 am and patient Kerry Simpson, RMA

## 2018-06-29 NOTE — Telephone Encounter (Signed)
-----   Message from Tammi Sou, [place] sent at 06/29/2018 12:43 PM EDT ----- Pt agrees with referral to GI doc, Please put referral in and I advise pt our [facility] will contact her to schedule appt

## 2018-06-29 NOTE — Telephone Encounter (Signed)
Ref done  Will route to [facility] 

## 2018-07-01 NOTE — Telephone Encounter (Signed)
Pt notified and will pick up stool cards

## 2018-07-02 ENCOUNTER — Encounter: Payer: Self-pay | Admitting: Family Medicine

## 2018-07-14 ENCOUNTER — Other Ambulatory Visit: Payer: Self-pay | Admitting: Cardiovascular Disease

## 2018-07-14 NOTE — Addendum Note (Signed)
Addended by: Ellamae Sia on: 07/14/2018 12:09 PM   Modules accepted: Orders

## 2018-07-15 ENCOUNTER — Other Ambulatory Visit: Payer: Medicare HMO

## 2018-07-15 DIAGNOSIS — D649 Anemia, unspecified: Secondary | ICD-10-CM

## 2018-07-15 LAB — HEMOCCULT SLIDES (X 3 CARDS)
FECAL OCCULT BLD: NEGATIVE
OCCULT 1: NEGATIVE
OCCULT 2: NEGATIVE
OCCULT 3: NEGATIVE
OCCULT 4: NEGATIVE
OCCULT 5: NEGATIVE

## 2018-07-16 ENCOUNTER — Telehealth: Payer: Self-pay | Admitting: Cardiovascular Disease

## 2018-07-16 DIAGNOSIS — Z7901 Long term (current) use of anticoagulants: Secondary | ICD-10-CM | POA: Diagnosis not present

## 2018-07-16 DIAGNOSIS — R1012 Left upper quadrant pain: Secondary | ICD-10-CM | POA: Diagnosis not present

## 2018-07-16 DIAGNOSIS — D509 Iron deficiency anemia, unspecified: Secondary | ICD-10-CM | POA: Diagnosis not present

## 2018-07-16 NOTE — Telephone Encounter (Signed)
   [facility] Pre-operative Risk Assessment    Request for surgical clearance:  1. What type of surgery is being performed? Colonoscopy/ EGD   2. When is this surgery scheduled? 09/08/18   3. What type of clearance is required (medical clearance vs. Pharmacy clearance to hold med vs. Both)? Pharmacy  4. Are there any medications that need to be held prior to surgery and how long? Xarelto- not specified, but options are for 3, 5, or 7 days prior   5. Practice name and name of physician performing surgery? [facility] Gastro Dept- Dr. Gustavo Lah   6. What is your office phone number: [phone] - [place]   7.   What is your office fax number- [phone]  8.   Anesthesia type (None, local, MAC, general) ? Monitored   Alvis Lemmings 07/16/2018, 4:34 PM  _________________________________________________________________   (provider comments below)

## 2018-07-16 NOTE — Telephone Encounter (Signed)
Patient with diagnosis of Afib on Xarelto for anticoagulation.    Procedure: colonoscopy/EGD Date of procedure: 09/08/18  CHADS2-VASc score of  3 (CHF, HTN, AGE, DM2, stroke/tia x 2, CAD, AGE, female)  CrCl 84ml/min  Per office protocol, patient can hold Xarelto for 24 hours prior to procedure.

## 2018-07-17 ENCOUNTER — Other Ambulatory Visit: Payer: Self-pay | Admitting: Gastroenterology

## 2018-07-17 DIAGNOSIS — R1012 Left upper quadrant pain: Secondary | ICD-10-CM

## 2018-07-21 NOTE — Telephone Encounter (Signed)
Copy of encounter faxed to [facility] Dept at [phone] with pharmacy recommendations for holding Xarelto.  Fax confirmation received.

## 2018-07-23 DIAGNOSIS — R69 Illness, unspecified: Secondary | ICD-10-CM | POA: Diagnosis not present

## 2018-07-23 NOTE — Telephone Encounter (Signed)
Pharmacy clearance for colonoscopy/ EGD already addressed by pharmacy. Routing to Dr. Rockey Situ to review for medical clearance.  Patient last seen 09/2017.

## 2018-07-23 NOTE — Telephone Encounter (Signed)
See also note below      [place] Pre-operative Risk Assessment    Request for surgical clearance:  1. What type of surgery is being performed? Colon EGD   2. When is this surgery scheduled? 09/08/18   3. What type of clearance is required (medical clearance vs. Pharmacy clearance to hold med vs. Both)? Both on form note to address xarelto   4. Are there any medications that need to be held prior to surgery and how long? xarelto 48 hrs prior to procedure   5. Practice name and name of physician performing surgery? [facility] Gastro Dr. Gustavo Lah   6. What is your office phone number [phone]    7.   What is your office fax number  [phone]  8.   Anesthesia type (None, local, MAC, general) ? Monitored    Kerry Simpson 07/23/2018, 8:17 AM  _________________________________________________________________   (provider comments below)

## 2018-07-26 NOTE — Telephone Encounter (Signed)
Acceptable risk for procedure No further testing needed 

## 2018-07-27 NOTE — Telephone Encounter (Signed)
Clearance routed to number listed. 

## 2018-07-28 ENCOUNTER — Ambulatory Visit
Admission: RE | Admit: 2018-07-28 | Discharge: 2018-07-28 | Disposition: A | Discharge status: Home / Self Care | Payer: Medicare HMO | Source: Ambulatory Visit | Attending: Gastroenterology | Admitting: Gastroenterology

## 2018-07-28 DIAGNOSIS — R1012 Left upper quadrant pain: Secondary | ICD-10-CM | POA: Diagnosis not present

## 2018-07-28 DIAGNOSIS — K315 Obstruction of duodenum: Secondary | ICD-10-CM | POA: Insufficient documentation

## 2018-07-28 DIAGNOSIS — R109 Unspecified abdominal pain: Secondary | ICD-10-CM | POA: Diagnosis not present

## 2018-07-28 LAB — POCT I-STAT CREATININE: CREATININE: 1 mg/dL (ref 0.44–1.00)

## 2018-07-28 MED ORDER — IOHEXOL 300 MG/ML  SOLN
100.0000 mL | Freq: Once | INTRAMUSCULAR | Status: AC | PRN
  Administered 2018-07-28: 100 mL via INTRAVENOUS

## 2018-09-07 ENCOUNTER — Encounter: Payer: Self-pay | Admitting: *Deleted

## 2018-09-08 ENCOUNTER — Ambulatory Visit
Admission: RE | Admit: 2018-09-08 | Discharge: 2018-09-08 | Disposition: A | Discharge status: Home / Self Care | Payer: Medicare HMO | Source: Ambulatory Visit | Attending: Gastroenterology | Admitting: Gastroenterology

## 2018-09-08 ENCOUNTER — Encounter
Admission: RE | Disposition: A | Discharge status: Home / Self Care | Payer: Self-pay | Source: Ambulatory Visit | Attending: Gastroenterology

## 2018-09-08 ENCOUNTER — Encounter: Payer: Self-pay | Admitting: Anesthesiology

## 2018-09-08 ENCOUNTER — Ambulatory Visit: Payer: Medicare HMO | Admitting: Anesthesiology

## 2018-09-08 DIAGNOSIS — I10 Essential (primary) hypertension: Secondary | ICD-10-CM | POA: Diagnosis not present

## 2018-09-08 DIAGNOSIS — D500 Iron deficiency anemia secondary to blood loss (chronic): Secondary | ICD-10-CM | POA: Diagnosis not present

## 2018-09-08 DIAGNOSIS — K228 Other specified diseases of esophagus: Secondary | ICD-10-CM | POA: Insufficient documentation

## 2018-09-08 DIAGNOSIS — E0500 Thyrotoxicosis with diffuse goiter without thyrotoxic crisis or storm: Secondary | ICD-10-CM | POA: Insufficient documentation

## 2018-09-08 DIAGNOSIS — K2900 Acute gastritis without bleeding: Secondary | ICD-10-CM | POA: Diagnosis not present

## 2018-09-08 DIAGNOSIS — R140 Abdominal distension (gaseous): Secondary | ICD-10-CM | POA: Diagnosis not present

## 2018-09-08 DIAGNOSIS — I4891 Unspecified atrial fibrillation: Secondary | ICD-10-CM | POA: Insufficient documentation

## 2018-09-08 DIAGNOSIS — K2990 Gastroduodenitis, unspecified, without bleeding: Secondary | ICD-10-CM | POA: Diagnosis not present

## 2018-09-08 DIAGNOSIS — K3189 Other diseases of stomach and duodenum: Secondary | ICD-10-CM | POA: Diagnosis not present

## 2018-09-08 DIAGNOSIS — E669 Obesity, unspecified: Secondary | ICD-10-CM | POA: Insufficient documentation

## 2018-09-08 DIAGNOSIS — K210 Gastro-esophageal reflux disease with esophagitis: Secondary | ICD-10-CM | POA: Insufficient documentation

## 2018-09-08 DIAGNOSIS — D509 Iron deficiency anemia, unspecified: Secondary | ICD-10-CM | POA: Insufficient documentation

## 2018-09-08 DIAGNOSIS — M810 Age-related osteoporosis without current pathological fracture: Secondary | ICD-10-CM | POA: Diagnosis not present

## 2018-09-08 DIAGNOSIS — Z7901 Long term (current) use of anticoagulants: Secondary | ICD-10-CM | POA: Diagnosis not present

## 2018-09-08 DIAGNOSIS — K2980 Duodenitis without bleeding: Secondary | ICD-10-CM | POA: Insufficient documentation

## 2018-09-08 DIAGNOSIS — K2960 Other gastritis without bleeding: Secondary | ICD-10-CM | POA: Diagnosis not present

## 2018-09-08 DIAGNOSIS — Z6838 Body mass index (BMI) 38.0-38.9, adult: Secondary | ICD-10-CM | POA: Insufficient documentation

## 2018-09-08 DIAGNOSIS — R1012 Left upper quadrant pain: Secondary | ICD-10-CM | POA: Diagnosis present

## 2018-09-08 HISTORY — DX: Failed or difficult intubation, initial encounter: T88.4XXA

## 2018-09-08 HISTORY — DX: Calculus of gallbladder without cholecystitis without obstruction: K80.20

## 2018-09-08 HISTORY — PX: ESOPHAGOGASTRODUODENOSCOPY (EGD) WITH PROPOFOL: SHX5813

## 2018-09-08 HISTORY — DX: Thyrotoxicosis with diffuse goiter without thyrotoxic crisis or storm: E05.00

## 2018-09-08 SURGERY — ESOPHAGOGASTRODUODENOSCOPY (EGD) WITH PROPOFOL
Anesthesia: General

## 2018-09-08 MED ORDER — FENTANYL CITRATE (PF) 100 MCG/2ML IJ SOLN
INTRAMUSCULAR | Status: AC
  Filled 2018-09-08: qty 2

## 2018-09-08 MED ORDER — ROCURONIUM BROMIDE 50 MG/5ML IV SOLN
INTRAVENOUS | Status: AC
  Filled 2018-09-08: qty 1

## 2018-09-08 MED ORDER — LIDOCAINE HCL (CARDIAC) PF 100 MG/5ML IV SOSY
PREFILLED_SYRINGE | INTRAVENOUS | Status: DC | PRN
  Administered 2018-09-08: 50 mg via INTRAVENOUS

## 2018-09-08 MED ORDER — GLYCOPYRROLATE 0.2 MG/ML IJ SOLN
INTRAMUSCULAR | Status: DC | PRN
  Administered 2018-09-08: 0.2 mg via INTRAVENOUS

## 2018-09-08 MED ORDER — PHENYLEPHRINE HCL 10 MG/ML IJ SOLN
INTRAMUSCULAR | Status: DC | PRN
  Administered 2018-09-08 (×2): 200 ug via INTRAVENOUS

## 2018-09-08 MED ORDER — OXYCODONE HCL 5 MG PO TABS: 5.0000 mg | ORAL_TABLET | Freq: Once | ORAL | Status: DC | PRN

## 2018-09-08 MED ORDER — SODIUM CHLORIDE 0.9 % IV SOLN
INTRAVENOUS | Status: DC
  Administered 2018-09-08: 1000 mL via INTRAVENOUS

## 2018-09-08 MED ORDER — FENTANYL CITRATE (PF) 100 MCG/2ML IJ SOLN
INTRAMUSCULAR | Status: DC | PRN
  Administered 2018-09-08: 100 ug via INTRAVENOUS

## 2018-09-08 MED ORDER — SUGAMMADEX SODIUM 200 MG/2ML IV SOLN
INTRAVENOUS | Status: DC | PRN
  Administered 2018-09-08: 354 mg via INTRAVENOUS

## 2018-09-08 MED ORDER — SUCCINYLCHOLINE CHLORIDE 20 MG/ML IJ SOLN
INTRAMUSCULAR | Status: AC
  Filled 2018-09-08: qty 1

## 2018-09-08 MED ORDER — PROPOFOL 10 MG/ML IV BOLUS
INTRAVENOUS | Status: DC | PRN
  Administered 2018-09-08: 150 mg via INTRAVENOUS

## 2018-09-08 MED ORDER — ROCURONIUM BROMIDE 100 MG/10ML IV SOLN
INTRAVENOUS | Status: DC | PRN
  Administered 2018-09-08: 30 mg via INTRAVENOUS

## 2018-09-08 MED ORDER — OXYCODONE HCL 5 MG/5ML PO SOLN: 5.0000 mg | Freq: Once | ORAL | Status: DC | PRN

## 2018-09-08 MED ORDER — GLYCOPYRROLATE 0.2 MG/ML IJ SOLN
INTRAMUSCULAR | Status: AC
  Filled 2018-09-08: qty 1

## 2018-09-08 MED ORDER — LABETALOL HCL 5 MG/ML IV SOLN
INTRAVENOUS | Status: DC | PRN
  Administered 2018-09-08: 10 mg via INTRAVENOUS

## 2018-09-08 MED ORDER — DEXAMETHASONE SODIUM PHOSPHATE 10 MG/ML IJ SOLN
INTRAMUSCULAR | Status: AC
  Filled 2018-09-08: qty 1

## 2018-09-08 MED ORDER — PHENYLEPHRINE HCL 10 MG/ML IJ SOLN
INTRAMUSCULAR | Status: AC
  Filled 2018-09-08: qty 1

## 2018-09-08 MED ORDER — LACTATED RINGERS IV SOLN
INTRAVENOUS | Status: DC | PRN
  Administered 2018-09-08: 14:00:00 via INTRAVENOUS

## 2018-09-08 MED ORDER — FENTANYL CITRATE (PF) 100 MCG/2ML IJ SOLN: 25.0000 ug | INTRAMUSCULAR | Status: DC | PRN

## 2018-09-08 MED ORDER — LIDOCAINE HCL (PF) 2 % IJ SOLN
INTRAMUSCULAR | Status: AC
  Filled 2018-09-08: qty 10

## 2018-09-08 MED ORDER — SUGAMMADEX SODIUM 200 MG/2ML IV SOLN
INTRAVENOUS | Status: AC
  Filled 2018-09-08: qty 4

## 2018-09-08 MED ORDER — PROPOFOL 10 MG/ML IV BOLUS
INTRAVENOUS | Status: AC
  Filled 2018-09-08: qty 20

## 2018-09-08 MED ORDER — DEXAMETHASONE SODIUM PHOSPHATE 10 MG/ML IJ SOLN
INTRAMUSCULAR | Status: DC | PRN
  Administered 2018-09-08: 10 mg via INTRAVENOUS

## 2018-09-08 MED ORDER — ONDANSETRON HCL 4 MG/2ML IJ SOLN
INTRAMUSCULAR | Status: DC | PRN
  Administered 2018-09-08: 4 mg via INTRAVENOUS

## 2018-09-08 MED ORDER — ONDANSETRON HCL 4 MG/2ML IJ SOLN
INTRAMUSCULAR | Status: AC
  Filled 2018-09-08: qty 2

## 2018-09-08 NOTE — Anesthesia Preprocedure Evaluation (Signed)
Anesthesia Evaluation  Patient identified by MRN, date of birth, ID band Patient awake    Reviewed: Allergy & Precautions, H&P , NPO status , Patient's Chart, lab work & pertinent test results  History of Anesthesia Complications (+) history of anesthetic complications  Airway Mallampati: III  TM Distance: <3 FB Neck ROM: limited    Dental  (+) Chipped, Poor Dentition   Pulmonary neg shortness of breath,  Signs and symptoms suggestive of sleep apnea            Cardiovascular Exercise Tolerance: Good hypertension, (-) angina+ DOE  (-) Past MI and (-) PND      Neuro/Psych negative neurological ROS  negative psych ROS   GI/Hepatic negative GI ROS, Neg liver ROS, GERD  Medicated and Controlled,  Endo/Other  Hyperthyroidism   Renal/GU      Musculoskeletal   Abdominal   Peds  Hematology negative hematology ROS (+)   Anesthesia Other Findings Past Medical History: No date: Anemia     Comment:  iron def No date: Atrial fibrillation ([place])     Comment:  Dr Rockey Situ 05/13/2018: Cataract     Comment:  both eyes per last vision exam  No date: Complication of anesthesia     Comment:  "quit breathing" -Endoscopy( 12-22-14) No date: Fibrocystic breast No date: Fracture of right shoulder No date: Gallstones No date: Grave's disease No date: Graves disease No date: History of kidney stones     Comment:  not a problem at present No date: Hypertension No date: Obesity No date: Osteoporosis     Comment:  osteopenia  Past Surgical History: 09/29/2014: COLONOSCOPY     Comment:  Normal, Delfin Edis, MD 04/12/2016: ELECTROPHYSIOLOGIC STUDY; N/A     Comment:  Procedure: CARDIOVERSION;  Surgeon: Minna Merritts,               MD;  Location: [facility] ORS;  Service: Cardiovascular;                Laterality: N/A; No date: ESOPHAGOGASTRODUODENOSCOPY ENDOSCOPY 01/12/2015: EUS; N/A     Comment:  Procedure: UPPER ENDOSCOPIC ULTRASOUND  (EUS) LINEAR;                Surgeon: Milus Banister, MD;  Location: [facility] END[facility];                Service: Endoscopy;  Laterality: N/A; No date: TUBAL LIGATION  BMI    Body Mass Index:  38.08 kg/m      Reproductive/Obstetrics negative OB ROS                             Anesthesia Physical Anesthesia Plan  ASA: III  Anesthesia Plan: General ETT   Post-op Pain Management:    Induction: Intravenous  PONV Risk Score and Plan: Ondansetron, Dexamethasone, Midazolam and Treatment may vary due to age or medical condition  Airway Management Planned: Oral ETT  Additional Equipment:   Intra-op Plan:   Post-operative Plan: Extubation in OR  Informed Consent: I have reviewed the patients History and Physical, chart, labs and discussed the procedure including the risks, benefits and alternatives for the proposed anesthesia with the patient or authorized representative who has indicated his/her understanding and acceptance.   Dental Advisory Given  Plan Discussed with: Anesthesiologist, CRNA and Surgeon  Anesthesia Plan Comments: (Patient has history of multiple airway obstructions with upper endoscopies so plan to intubate   Patient consented for risks  of anesthesia including but not limited to:  - adverse reactions to medications - damage to teeth, lips or other oral mucosa - sore throat or hoarseness - Damage to heart, brain, lungs or loss of life  Patient voiced understanding.)        Anesthesia Quick Evaluation

## 2018-09-08 NOTE — Anesthesia Post-op Follow-up Note (Signed)
Anesthesia QCDR form completed.        

## 2018-09-08 NOTE — Anesthesia Procedure Notes (Addendum)
Procedure Name: Intubation Date/Time: 09/08/2018 1:49 PM Performed by: Nile Riggs, CRNA Pre-anesthesia Checklist: Patient identified, Emergency Drugs available, Suction available, Patient being monitored and Timeout performed Patient Re-evaluated:Patient Re-evaluated prior to induction Oxygen Delivery Method: Circle system utilized Preoxygenation: Pre-oxygenation with 100% oxygen Induction Type: IV induction and Cricoid Pressure applied Ventilation: Oral airway inserted - appropriate to patient size and Two handed mask ventilation required Laryngoscope Size: Miller and 2 Grade View: Grade II Tube type: Oral Tube size: 7.0 mm Number of attempts: 1 Airway Equipment and Method: Stylet Placement Confirmation: ETT inserted through vocal cords under direct vision,  positive ETCO2,  CO2 detector and breath sounds checked- equal and bilateral Secured at: 21 cm Tube secured with: Tape Dental Injury: Teeth and Oropharynx as per pre-operative assessment  Difficulty Due To: Difficulty was anticipated, Difficult Airway- due to large tongue and Difficult Airway- due to anterior larynx Comments: OPA 9/90mm inserted for 2-man mask

## 2018-09-08 NOTE — Transfer of Care (Signed)
Immediate Anesthesia Transfer of Care Note  Patient: Kerry Simpson  Procedure(s) Performed: ESOPHAGOGASTRODUODENOSCOPY (EGD) WITH PROPOFOL (N/A )  Patient Location: PACU  Anesthesia Type:General  Level of Consciousness: drowsy and patient cooperative  Airway & Oxygen Therapy: Patient Spontanous Breathing and Patient connected to face mask oxygen  Post-op Assessment: Report given to RN, Post -op Vital signs reviewed and stable and Patient moving all extremities  Post vital signs: Reviewed and stable  Last Vitals:  Vitals Value Taken Time  BP 135/70 09/08/2018  2:22 PM  Temp 36.6 C 09/08/2018  2:22 PM  Pulse 73 09/08/2018  2:25 PM  Resp 23 09/08/2018  2:25 PM  SpO2 99 % 09/08/2018  2:25 PM  Vitals shown include unvalidated device data.  Last Pain:  Vitals:   09/08/18 1231  TempSrc: Tympanic  PainSc: 0-No pain         Complications: No apparent anesthesia complications

## 2018-09-08 NOTE — H&P (Signed)
Outpatient short stay form Pre-procedure 09/08/2018 1:21 PM Kerry Sails MD  Primary Physician: Dr. Loura Pardon  Reason for visit: EGD  History of present illness: Patient is a 72 year old female presenting today for an EGD.  She has complained of left upper quadrant abdominal pain with some bloating.  Had been taking Lasix 20 mg a day and this was increased to 40 mg once a day this seemed to be of some benefit.  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  Was evaluated by ultrasound as well felt to be benign.  Resents with above symptoms.  Patient had 2 upper GI procedures, a EGD as well as a endoscopic ultrasound 12/13/2014 and 01/12/2015 respectively.  On both of these she had issues with hypoxia.  Second procedure, EUS required intubation at that time.  Such case was discussed with anesthesiology today and decision to debate for the procedure.  Patient patient takes Xarelto has not taken it for over 48 hours.  Takes no other aspirin or thinning agent.    Current Facility-Administered Medications:  .  0.9 %  sodium chloride infusion, , Intravenous, Continuous, Kerry Sails, MD, Last Rate: 20 mL/hr at 09/08/18 1248, 1,000 mL at 09/08/18 1248  Medications Prior to Admission  Medication Sig Dispense Refill Last Dose  . ferrous sulfate 325 (65 FE) MG tablet Take 325 mg by mouth daily with breakfast.     . acetaminophen (TYLENOL) 500 MG tablet Take 1,000 mg by mouth daily as needed.    Taking  . amiodarone (PACERONE) 200 MG tablet TAKE 1 TABLET BY MOUTH ONCE DAILY 90 tablet 3 09/08/2018 at 0700  . amLODipine (NORVASC) 5 MG tablet TAKE 1 TABLET BY MOUTH ONCE DAILY 30 tablet 3 09/08/2018 at 0700  . Cholecalciferol (VITAMIN D3 PO) Take 400 Units by mouth daily.   Taking  . Cyanocobalamin (VITAMIN B 12 PO) Take by mouth daily.   Taking  . fosinopril (MONOPRIL) 20 MG tablet Take 1 tablet (20 mg total) by mouth daily. 90 tablet 3   . furosemide  (LASIX) 20 MG tablet Take 1 tablet (20 mg total) by mouth daily as needed. 30 tablet 3 Taking  . omeprazole (PRILOSEC) 20 MG capsule Take 1 capsule (20 mg total) by mouth daily. 90 capsule 3   . PARoxetine (PAXIL) 10 MG tablet Take 1 tablet (10 mg total) by mouth daily. 90 tablet 3   . XARELTO 20 MG TABS tablet TAKE 1 TABLET BY MOUTH ONCE A DAY WITH SUPPER 30 tablet 2      No Known Allergies   Past Medical History:  Diagnosis Date  . Anemia    iron def  . Atrial fibrillation ([place])    Dr Rockey Situ  . Cataract 05/13/2018   both eyes per last vision exam   . Complication of anesthesia    "quit breathing" -Endoscopy( 12-22-14)  . Fibrocystic breast   . Fracture of right shoulder   . Gallstones   . Grave's disease   . Graves disease   . History of kidney stones    not a problem at present  . Hypertension   . Obesity   . Osteoporosis    osteopenia    Review of systems:      Physical Exam    Heart and lungs: Regular rate and rhythm without rub or gallop, lungs are bilaterally clear.    HEENT: Normocephalic atraumatic eyes are anicteric    Other:  Pertinant exam for procedure: Soft nontender nondistended bowel sounds positive normoactive.    Planned proceedures: EGD and indicated procedures. I have discussed the risks benefits and complications of procedures to include not limited to bleeding, infection, perforation and the risk of sedation and the patient wishes to proceed.    Kerry Sails, MD Gastroenterology 09/08/2018  1:21 PM

## 2018-09-08 NOTE — Op Note (Signed)
[facility]astroenter[facility] Patient Name: Kerry Simpson Procedure Date: 09/08/2018 1:25 PM MRN: [id number] Account #: [id number] Date of Birth: [birth year] Admit Type: Outpatient Age: 72 Room: [facility] ENDO ROOM 2 Gender: Female Note Status: Finalized Procedure:            Upper GI endoscopy Indications:          Abdominal pain in the left upper quadrant, Abdominal                        bloating Providers:            Lollie Sails, MD Referring MD:         Wynelle Fanny. Tower (Referring MD) Medicines:            General Anesthesia Complications:        No immediate complications. Procedure:            Pre-Anesthesia Assessment:                       - ASA Grade Assessment: III - A patient with severe                        systemic disease.                       After obtaining informed consent, the endoscope was                        passed under direct vision. Throughout the procedure,                        the patient's blood pressure, pulse, and oxygen                        saturations were monitored continuously. The Endoscope                        was introduced through the mouth, and advanced to the                        third part of duodenum. The upper GI endoscopy was                        accomplished without difficulty. The patient tolerated                        the procedure well. Findings:      The Z-line was variable. Biopsies were taken with a cold forceps for       histology.      The exam of the esophagus was otherwise normal.      Patchy moderate inflammation characterized by congestion (edema),       erosions and erythema was found at the incisura and in the prepyloric       region of the stomach.      The exam of the stomach was otherwise normal.      Biopsies were taken with a cold forceps in the gastric body and in the       gastric antrum for histology.      The cardia and gastric fundus were normal on retroflexion.      The  examined duodenum was normal, though a mild stenosis was felt/seen       in the second portion/posterior bulb that was passed with the scope       without difficulty or irritation of the mucosa. Impression:           - Z-line variable. Biopsied.                       - Erosive gastritis.                       - Normal examined duodenum.                       - Biopsies were taken with a cold forceps for histology                        in the gastric body and in the gastric antrum. Recommendation:       - Discharge patient to home.                       - Soft diet today. Procedure Code(s):    --- Professional ---                       [phone], Esophagogastroduodenoscopy, flexible, transoral;                        with biopsy, single or multiple Diagnosis Code(s):    --- Professional ---                       K22.8, Other specified diseases of esophagus                       K29.60, Other gastritis without bleeding                       R10.12, Left upper quadrant pain                       R14.0, Abdominal distension (gaseous) CPT copyright 2018 American Medical Association. All rights reserved. The codes documented in this report are preliminary and upon coder review may  be revised to meet current compliance requirements. Lollie Sails, MD 09/08/2018 2:16:08 PM This report has been signed electronically. Number of Addenda: 0 Note Initiated On: 09/08/2018 1:25 PM      [facility]

## 2018-09-09 ENCOUNTER — Other Ambulatory Visit: Payer: Self-pay | Admitting: Cardiovascular Disease

## 2018-09-09 ENCOUNTER — Encounter: Payer: Self-pay | Admitting: Gastroenterology

## 2018-09-09 NOTE — Anesthesia Postprocedure Evaluation (Signed)
Anesthesia Post Note  Patient: Kerry Simpson  Procedure(s) Performed: ESOPHAGOGASTRODUODENOSCOPY (EGD) WITH PROPOFOL (N/A )  Patient location during evaluation: Endoscopy Anesthesia Type: General Level of consciousness: awake and alert Pain management: pain level controlled Vital Signs Assessment: post-procedure vital signs reviewed and stable Respiratory status: spontaneous breathing, nonlabored ventilation, respiratory function stable and patient connected to nasal cannula oxygen Cardiovascular status: blood pressure returned to baseline and stable Postop Assessment: no apparent nausea or vomiting Anesthetic complications: no     Last Vitals:  Vitals:   09/08/18 1449 09/08/18 1452  BP:  125/74  Pulse: 67 66  Resp: 18 17  Temp:  36.6 C  SpO2: 99% 98%    Last Pain:  Vitals:   09/08/18 1452  TempSrc:   PainSc: 0-No pain                 Precious Haws Piscitello

## 2018-09-09 NOTE — Telephone Encounter (Signed)
Please review for refill. Thanks!  

## 2018-09-12 LAB — SURGICAL PATHOLOGY

## 2018-10-06 NOTE — Progress Notes (Signed)
Cardiology Office Note  Date:  10/07/2018   ID:  Kerry Simpson, DOB [birth year], MRN [id number]  PCP:  Abner Greenspan, MD   Chief Complaint  Patient presents with  . other    12 month follow up. Meds reviewed by the pt. verbally. "doing well."     HPI:  Kerry Simpson is a pleasant 72 year old woman with history of  obesity,  hypertension,  GERD  who presents for routine follow-up for persistent atrial fibrillation  Feels well today, Active, no complaints of tachycardia concerning for atrial fibrillation Reports weight is stable No exercise program Trying to watch her diet Previous visits with chronic fatigue  Not a smoker  Labs reviewed with her in detail HBA1C 6.2 totaL CHOL 153,  Ldl 77 HCT 35  EKG personally reviewed by myself on todays visit shows normal sinus rhythm with rate 66 bpm, poor R-wave progression through the anterior precordial leads, left axis deviation  Other past medical history reviewed Echo 02/2016: EF 60%, RVSp 38  Started on amio 03/2016: CHADS-VASc score is greater than or equal to 3 (hypertension, age, gender)  Seen by dr. [name] 04/2016:  Tachy/brady digoxin held  sleep disordered breathing and evidence of HFpEF. Seen by [name] 06/2016, no med changes  Does not think she has OSA Cancelled sleep study Previous cardioversion cancelled, she converted on her own on amio  Echocardiogram  showing normal LV function, mildly elevated right heart pressures  She does report having issues with thyroid in the past, was told that she had Graves' disease but recent lab work shows normal TSH    PMH:   has a past medical history of Anemia, Atrial fibrillation ([place]), Cataract (40/34/7425), Complication of anesthesia, Difficult intubation, Fibrocystic breast, Fracture of right shoulder, Gallstones, Grave's disease, Graves disease, History of kidney stones, Hypertension, Obesity, and Osteoporosis.  PSH:    Past Surgical History:  Procedure  Laterality Date  . COLONOSCOPY  09/29/2014   Normal, Delfin Edis, MD  . ELECTROPHYSIOLOGIC STUDY N/A 04/12/2016   Procedure: CARDIOVERSION;  Surgeon: Minna Merritts, MD;  Location: [facility] ORS;  Service: Cardiovascular;  Laterality: N/A;  . ESOPHAGOGASTRODUODENOSCOPY (EGD) WITH PROPOFOL N/A 09/08/2018   Procedure: ESOPHAGOGASTRODUODENOSCOPY (EGD) WITH PROPOFOL;  Surgeon: Lollie Sails, MD;  Location: [facility] ENDOSCOPY;  Service: Endoscopy;  Laterality: N/A;  . ESOPHAGOGASTRODUODENOSCOPY ENDOSCOPY    . EUS N/A 01/12/2015   Procedure: UPPER ENDOSCOPIC ULTRASOUND (EUS) LINEAR;  Surgeon: Milus Banister, MD;  Location: [facility] ENDOSCOPY;  Service: Endoscopy;  Laterality: N/A;  . TUBAL LIGATION      Current Outpatient Medications  Medication Sig Dispense Refill  . acetaminophen (TYLENOL) 500 MG tablet Take 1,000 mg by mouth daily as needed.     Marland Kitchen amiodarone (PACERONE) 200 MG tablet TAKE 1 TABLET BY MOUTH ONCE DAILY 90 tablet 3  . amLODipine (NORVASC) 5 MG tablet TAKE 1 TABLET BY MOUTH ONCE DAILY 30 tablet 3  . Cholecalciferol (VITAMIN D3 PO) Take 400 Units by mouth daily.    . Cyanocobalamin (VITAMIN B 12 PO) Take by mouth daily.    . ferrous sulfate 325 (65 FE) MG tablet Take 325 mg by mouth daily with breakfast.    . fosinopril (MONOPRIL) 20 MG tablet Take 1 tablet (20 mg total) by mouth daily. 90 tablet 3  . furosemide (LASIX) 20 MG tablet Take 1 tablet (20 mg total) by mouth daily as needed. 30 tablet 3  . pantoprazole (PROTONIX) 40 MG tablet Take 80 mg by mouth daily.  Take two daily for one month then one tablet daily.    Marland Kitchen PARoxetine (PAXIL) 10 MG tablet Take 1 tablet (10 mg total) by mouth daily. 90 tablet 3  . XARELTO 20 MG TABS tablet TAKE 1 TABLET BY MOUTH ONCE A DAY WITH SUPPER 30 tablet 2   No current facility-administered medications for this visit.      Allergies:   Patient has no known allergies.   Social History:  The patient  reports that she has never smoked. She has never used  smokeless tobacco. She reports that she does not drink alcohol or use drugs.   Family History:   family history includes Cancer in her brother; Diabetes in her father; Heart disease in her brother; Hypertension in her brother, father, and mother.    Review of Systems: Review of Systems  Constitutional: Negative.   Respiratory: Negative.   Cardiovascular: Negative.   Gastrointestinal: Negative.   Musculoskeletal: Negative.   Neurological: Negative.   Psychiatric/Behavioral: Negative.   All other systems reviewed and are negative.    PHYSICAL EXAM: VS:  BP 120/82 (BP Location: Left Arm, Patient Position: Sitting, Cuff Size: Normal)   Pulse 66   Ht 5' (1.524 m)   Wt 199 lb 8 oz (90.5 kg)   BMI 38.96 kg/m  , BMI Body mass index is 38.96 kg/m. Constitutional:  oriented to person, place, and time. No distress.  HENT:  Head: Grossly normal Eyes:  no discharge. No scleral icterus.  Neck: No JVD, no carotid bruits  Cardiovascular: Regular rate and rhythm, no murmurs appreciated Pulmonary/Chest: Clear to auscultation bilaterally, no wheezes or rails Abdominal: Soft.  no distension.  no tenderness.  Musculoskeletal: Normal range of motion Neurological:  normal muscle tone. Coordination normal. No atrophy Skin: Skin warm and dry Psychiatric: normal affect, pleasant   Recent Labs: 05/13/2018: ALT 14; BUN 15; Potassium 4.5; Sodium 141; TSH 1.10 06/23/2018: Hemoglobin 11.2; Platelets 282.0 07/28/2018: Creatinine, Ser 1.00    Lipid Panel Lab Results  Component Value Date   CHOL 153 05/13/2018   HDL 50.40 05/13/2018   LDLCALC 77 05/13/2018   TRIG 127.0 05/13/2018      Wt Readings from Last 3 Encounters:  10/07/18 199 lb 8 oz (90.5 kg)  09/08/18 195 lb (88.5 kg)  05/20/18 199 lb 4 oz (90.4 kg)       ASSESSMENT AND PLAN:  Essential hypertension - Plan: EKG 12-Lead Blood pressure stable, no medication changes made  Atrial fibrillation and flutter ([place]) - Plan: EKG  12-Lead On anticoagulation, rate control with amiodarone Sick sinus syndrome, not on beta-blocker  Class 2 obesity due to excess calories without serious comorbidity with body mass index (BMI) of 37.0 to 37.9 in adult Recommended regular walking program Does not like gym  DOE (dyspnea on exertion) Secondary to obesity, deconditioning Reports symptoms are stable  Chest pain, unspecified type Denies any episodes, no further work-up at this time  Sleep disturbance  declined sleep study Exercise for weight loss   Total encounter time more than 25 minutes  Greater than 50% was spent in counseling and coordination of care with the patient  Disposition:   F/U  12 months   Orders Placed This Encounter  Procedures  . EKG 12-Lead     Signed, Esmond Plants, M.D., Ph.D. 10/07/2018  [place], [place]

## 2018-10-07 ENCOUNTER — Encounter: Payer: Self-pay | Admitting: Cardiovascular Disease

## 2018-10-07 ENCOUNTER — Other Ambulatory Visit: Payer: Self-pay | Admitting: Cardiovascular Disease

## 2018-10-07 ENCOUNTER — Ambulatory Visit: Payer: Medicare HMO | Admitting: Cardiovascular Disease

## 2018-10-07 VITALS — BP 120/82 | HR 66 | Ht 60.0 in | Wt 199.5 lb

## 2018-10-07 DIAGNOSIS — I4891 Unspecified atrial fibrillation: Secondary | ICD-10-CM | POA: Diagnosis not present

## 2018-10-07 DIAGNOSIS — I5030 Unspecified diastolic (congestive) heart failure: Secondary | ICD-10-CM

## 2018-10-07 DIAGNOSIS — I10 Essential (primary) hypertension: Secondary | ICD-10-CM | POA: Diagnosis not present

## 2018-10-07 DIAGNOSIS — I4892 Unspecified atrial flutter: Secondary | ICD-10-CM | POA: Diagnosis not present

## 2018-10-07 NOTE — Patient Instructions (Signed)

## 2018-10-15 DIAGNOSIS — D509 Iron deficiency anemia, unspecified: Secondary | ICD-10-CM | POA: Diagnosis not present

## 2018-11-07 ENCOUNTER — Other Ambulatory Visit: Payer: Self-pay | Admitting: Cardiovascular Disease

## 2018-11-09 NOTE — Telephone Encounter (Signed)
Refill Request.  

## 2018-11-27 ENCOUNTER — Encounter: Payer: Self-pay | Admitting: Family Medicine

## 2018-11-27 ENCOUNTER — Ambulatory Visit (INDEPENDENT_AMBULATORY_CARE_PROVIDER_SITE_OTHER): Payer: Medicare HMO | Admitting: Family Medicine

## 2018-11-27 ENCOUNTER — Ambulatory Visit: Payer: Medicare HMO | Admitting: Physician Assistant

## 2018-11-27 ENCOUNTER — Telehealth: Payer: Self-pay | Admitting: Cardiovascular Disease

## 2018-11-27 VITALS — BP 128/78 | HR 78 | Temp 97.8°F | Ht 60.0 in | Wt 203.8 lb

## 2018-11-27 DIAGNOSIS — I5030 Unspecified diastolic (congestive) heart failure: Secondary | ICD-10-CM | POA: Diagnosis not present

## 2018-11-27 DIAGNOSIS — I4891 Unspecified atrial fibrillation: Secondary | ICD-10-CM | POA: Diagnosis not present

## 2018-11-27 DIAGNOSIS — K1120 Sialoadenitis, unspecified: Secondary | ICD-10-CM | POA: Diagnosis not present

## 2018-11-27 DIAGNOSIS — I4892 Unspecified atrial flutter: Secondary | ICD-10-CM

## 2018-11-27 NOTE — Patient Instructions (Signed)
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

## 2018-11-27 NOTE — Telephone Encounter (Signed)
Returned the call to the patient. She stated that when she woke up her face was swollen on the left side from her ear to mid jaw. She stated that the swelling has now gone away. The patient denies any swelling in other areas, no discoloration or pain to the area, no chest pain or shortness of breath. She has not started any new medications nor tried any new foods lately.  Per the patient, her appointment for today at the office has been cancelled. She has been advised to keep her appointment with her PCP today at 3:15 and to call back if she needs anything further.

## 2018-11-27 NOTE — Assessment & Plan Note (Signed)
Stable, no symptoms

## 2018-11-27 NOTE — Telephone Encounter (Signed)
Patient calling States that last night patient felt something happening to face and then she looked in the mirror and noticed she had swelling from ear to mid jaw line Swelling has gone down this morning  Would like to schedule - scheduled with R Dunn at 1:30p Please call to discuss

## 2018-11-27 NOTE — Progress Notes (Signed)
Subjective:    Patient ID: Kerry Simpson, female    DOB: 10-Feb-1946, 73 y.o.   MRN: [id number]  HPI Here for swelling on side of face   For the last 2 weeks she has had intermittent swelling of L side of the face  Near and in front of ear   One day she felt a sensation in the area (not pain or itch)- and it puffed up  "like water in a balloon" It gradually went down   She called cardiology to tell them -did not think it was heart related   No ear pain  No facial pain  No s/s/ of sinus infection   No redness or hives   Heart problems have been fine/stable   Patient Active Problem List   Diagnosis Date Noted  . Parotiditis 11/27/2018  . Iron deficiency anemia 05/23/2018  . Screening mammogram, encounter for 05/20/2018  . (HFpEF) heart failure with preserved ejection fraction ([place]) 05/19/2017  . GERD (gastroesophageal reflux disease) 08/20/2016  . DOE (dyspnea on exertion) 05/28/2016  . Sleep disturbance 05/28/2016  . Somnolence, daytime 05/09/2016  . Encounter for anticoagulation discussion and counseling 02/15/2016  . Atrial fibrillation and flutter ([place]) 02/14/2016  . Routine general medical examination at a health care facility 10/13/2015  . Estrogen deficiency 10/13/2015  . Fatigue 08/25/2015  . History of melena 12/12/2014  . Gallstone 12/07/2014  . Encounter for routine gynecological examination 04/14/2013  . Encounter for Medicare annual wellness exam 03/30/2013  . STRESS REACTION, ACUTE, WITH EMOTIONAL DISTURBANCE 11/26/2010  . HYPERGLYCEMIA 09/28/2010  . Osteopenia 06/21/2009  . Hyperthyroidism 04/26/2008  . Obesity 04/26/2008  . Essential hypertension 04/26/2008   Past Medical History:  Diagnosis Date  . Anemia    iron def  . Atrial fibrillation ([place])    Dr Rockey Situ  . Cataract 05/13/2018   both eyes per last vision exam   . Complication of anesthesia    "quit breathing" -Endoscopy( 12-22-14)  . Difficult intubation   . Fibrocystic breast   .  Fracture of right shoulder   . Gallstones   . Grave's disease   . Graves disease   . History of kidney stones    not a problem at present  . Hypertension   . Obesity   . Osteoporosis    osteopenia   Past Surgical History:  Procedure Laterality Date  . COLONOSCOPY  09/29/2014   Normal, Delfin Edis, MD  . ELECTROPHYSIOLOGIC STUDY N/A 04/12/2016   Procedure: CARDIOVERSION;  Surgeon: Minna Merritts, MD;  Location: [facility] ORS;  Service: Cardiovascular;  Laterality: N/A;  . ESOPHAGOGASTRODUODENOSCOPY (EGD) WITH PROPOFOL N/A 09/08/2018   Procedure: ESOPHAGOGASTRODUODENOSCOPY (EGD) WITH PROPOFOL;  Surgeon: Lollie Sails, MD;  Location: [facility] ENDOSCOPY;  Service: Endoscopy;  Laterality: N/A;  . ESOPHAGOGASTRODUODENOSCOPY ENDOSCOPY    . EUS N/A 01/12/2015   Procedure: UPPER ENDOSCOPIC ULTRASOUND (EUS) LINEAR;  Surgeon: Milus Banister, MD;  Location: [facility] ENDOSCOPY;  Service: Endoscopy;  Laterality: N/A;  . TUBAL LIGATION     Social History   Tobacco Use  . Smoking status: Never Smoker  . Smokeless tobacco: Never Used  Substance Use Topics  . Alcohol use: No    Alcohol/week: 0.0 standard drinks  . Drug use: No   Family History  Problem Relation Age of Onset  . Hypertension Mother   . Diabetes Father   . Hypertension Father   . Hypertension Brother   . Cancer Brother        prostate  .  Heart disease Brother        with bypass  . Colon cancer Neg Hx    No Known Allergies Current Outpatient Medications on File Prior to Visit  Medication Sig Dispense Refill  . acetaminophen (TYLENOL) 500 MG tablet Take 1,000 mg by mouth daily as needed.     Marland Kitchen amiodarone (PACERONE) 200 MG tablet TAKE 1 TABLET BY MOUTH ONCE DAILY 90 tablet 3  . amLODipine (NORVASC) 5 MG tablet TAKE 1 TABLET BY MOUTH ONCE DAILY 30 tablet 5  . Cholecalciferol (VITAMIN D3 PO) Take 400 Units by mouth daily.    . Cyanocobalamin (VITAMIN B 12 PO) Take by mouth daily.    . fosinopril (MONOPRIL) 20 MG tablet Take 1 tablet  (20 mg total) by mouth daily. 90 tablet 3  . furosemide (LASIX) 20 MG tablet Take 1 tablet (20 mg total) by mouth daily as needed. (Patient taking differently: Take 20 mg by mouth daily as needed (TAKES VERY RARE). ) 30 tablet 3  . Multiple Vitamin (MULTIVITAMIN) capsule Take 1 capsule by mouth daily.    . pantoprazole (PROTONIX) 40 MG tablet Take 80 mg by mouth daily. Take two daily for one month then one tablet daily.    Marland Kitchen PARoxetine (PAXIL) 10 MG tablet Take 1 tablet (10 mg total) by mouth daily. 90 tablet 3  . XARELTO 20 MG TABS tablet TAKE ONE TABLET BY MOUTH ONCE A DAY WITHSUPPER 30 tablet 2   No current facility-administered medications on file prior to visit.     Review of Systems  Constitutional: Negative for activity change, appetite change, fatigue, fever and unexpected weight change.  HENT: Positive for facial swelling. Negative for congestion, dental problem, drooling, ear discharge, ear pain, hearing loss, mouth sores, nosebleeds, postnasal drip, rhinorrhea, sinus pressure, sinus pain, sneezing, sore throat, tinnitus, trouble swallowing and voice change.   Eyes: Negative for pain, redness and visual disturbance.  Respiratory: Negative for cough, shortness of breath and wheezing.   Cardiovascular: Negative for chest pain and palpitations.  Gastrointestinal: Negative for abdominal pain, blood in stool, constipation and diarrhea.  Endocrine: Negative for polydipsia and polyuria.  Genitourinary: Negative for dysuria, frequency and urgency.  Musculoskeletal: Negative for arthralgias, back pain and myalgias.  Skin: Negative for pallor and rash.  Allergic/Immunologic: Negative for environmental allergies.  Neurological: Negative for dizziness, syncope and headaches.  Hematological: Negative for adenopathy. Does not bruise/bleed easily.  Psychiatric/Behavioral: Negative for decreased concentration and dysphoric mood. The patient is not nervous/anxious.        Objective:   Physical  Exam Constitutional:      General: She is not in acute distress.    Appearance: Normal appearance. She is obese. She is not ill-appearing.  HENT:     Head: Normocephalic and atraumatic.     Right Ear: Tympanic membrane, ear canal and external ear normal.     Left Ear: Tympanic membrane, ear canal and external ear normal.     Nose: Nose normal. No congestion or rhinorrhea.     Mouth/Throat:     Mouth: Mucous membranes are moist.     Pharynx: Oropharynx is clear. No oropharyngeal exudate or posterior oropharyngeal erythema.     Comments: Mouth is clear  No lesions No swelling around salivary ducts  Eyes:     General: No scleral icterus.       Right eye: No discharge.        Left eye: No discharge.     Extraocular Movements: Extraocular movements intact.  Conjunctiva/sclera: Conjunctivae normal.     Pupils: Pupils are equal, round, and reactive to light.  Neck:     Musculoskeletal: Normal range of motion. No neck rigidity or muscular tenderness.     Vascular: No carotid bruit.  Cardiovascular:     Rate and Rhythm: Normal rate.     Pulses: Normal pulses.     Heart sounds: No gallop.   Pulmonary:     Effort: Pulmonary effort is normal. No respiratory distress.     Breath sounds: Normal breath sounds. No wheezing.  Lymphadenopathy:     Cervical: No cervical adenopathy.  Skin:    General: Skin is warm and dry.     Capillary Refill: Capillary refill takes less than 2 seconds.     Coloration: Skin is not pale.     Findings: No erythema or rash.  Neurological:     General: No focal deficit present.     Mental Status: She is alert.     Cranial Nerves: No cranial nerve deficit.  Psychiatric:        Mood and Affect: Mood normal.           Assessment & Plan:   Problem List Items Addressed This Visit      Cardiovascular and Mediastinum   Atrial fibrillation and flutter ([place])    Continue cardiology f/u and anti coagulation persistant  No symptoms       (HFpEF) heart  failure with preserved ejection fraction ([facility])    Stable, no symptoms         Digestive   Parotiditis    On the L side (waxes and wanes)  No fever or other constitutional symptoms- may have a small salivary stone or other blockage Recommended sucking on sour things- lemon wedge or sour sugar free candy  Watch for worse swelling/ redness/skin change or fever  Update if not starting to improve in a week or if worsening

## 2018-11-27 NOTE — Assessment & Plan Note (Addendum)
Continue cardiology f/u and anti coagulation persistant  No symptoms

## 2018-11-28 NOTE — Assessment & Plan Note (Signed)
On the L side (waxes and wanes)  No fever or other constitutional symptoms- may have a small salivary stone or other blockage Recommended sucking on sour things- lemon wedge or sour sugar free candy  Watch for worse swelling/ redness/skin change or fever  Update if not starting to improve in a week or if worsening

## 2019-02-27 ENCOUNTER — Other Ambulatory Visit: Payer: Self-pay | Admitting: Cardiovascular Disease

## 2019-03-01 NOTE — Telephone Encounter (Signed)
Please review for refill.  

## 2019-03-23 ENCOUNTER — Other Ambulatory Visit: Payer: Self-pay | Admitting: Cardiovascular Disease

## 2019-05-01 ENCOUNTER — Other Ambulatory Visit: Payer: Self-pay | Admitting: Cardiovascular Disease

## 2019-05-13 ENCOUNTER — Other Ambulatory Visit: Payer: Self-pay | Admitting: Family Medicine

## 2019-05-23 ENCOUNTER — Telehealth: Payer: Self-pay | Admitting: Family Medicine

## 2019-05-23 DIAGNOSIS — D509 Iron deficiency anemia, unspecified: Secondary | ICD-10-CM

## 2019-05-23 DIAGNOSIS — E0590 Thyrotoxicosis, unspecified without thyrotoxic crisis or storm: Secondary | ICD-10-CM

## 2019-05-23 DIAGNOSIS — R7303 Prediabetes: Secondary | ICD-10-CM

## 2019-05-23 DIAGNOSIS — I10 Essential (primary) hypertension: Secondary | ICD-10-CM

## 2019-05-23 NOTE — Telephone Encounter (Signed)
-----   Message from Ellamae Sia sent at 05/19/2019  2:28 PM EDT ----- Regarding: Lab orders for Monday, 7.13.20 Patient is scheduled for CPX labs, please order future labs, Thanks , Karna Christmas

## 2019-05-24 ENCOUNTER — Other Ambulatory Visit (INDEPENDENT_AMBULATORY_CARE_PROVIDER_SITE_OTHER): Payer: Medicare HMO

## 2019-05-24 DIAGNOSIS — I10 Essential (primary) hypertension: Secondary | ICD-10-CM

## 2019-05-24 DIAGNOSIS — R7303 Prediabetes: Secondary | ICD-10-CM

## 2019-05-24 DIAGNOSIS — D509 Iron deficiency anemia, unspecified: Secondary | ICD-10-CM | POA: Diagnosis not present

## 2019-05-24 DIAGNOSIS — E0590 Thyrotoxicosis, unspecified without thyrotoxic crisis or storm: Secondary | ICD-10-CM | POA: Diagnosis not present

## 2019-05-24 LAB — CBC WITH DIFFERENTIAL/PLATELET
Basophils Absolute: 0.1 10*3/uL (ref 0.0–0.1)
Basophils Relative: 1.2 % (ref 0.0–3.0)
Eosinophils Absolute: 0.2 10*3/uL (ref 0.0–0.7)
Eosinophils Relative: 2.4 % (ref 0.0–5.0)
HCT: 40.2 % (ref 36.0–46.0)
Hemoglobin: 12.9 g/dL (ref 12.0–15.0)
Lymphocytes Relative: 24.8 % (ref 12.0–46.0)
Lymphs Abs: 1.6 10*3/uL (ref 0.7–4.0)
MCHC: 32 g/dL (ref 30.0–36.0)
MCV: 83.7 fl (ref 78.0–100.0)
Monocytes Absolute: 0.6 10*3/uL (ref 0.1–1.0)
Monocytes Relative: 9.6 % (ref 3.0–12.0)
Neutro Abs: 4 10*3/uL (ref 1.4–7.7)
Neutrophils Relative %: 62 % (ref 43.0–77.0)
Platelets: 257 10*3/uL (ref 150.0–400.0)
RBC: 4.8 Mil/uL (ref 3.87–5.11)
RDW: 15 % (ref 11.5–15.5)
WBC: 6.5 10*3/uL (ref 4.0–10.5)

## 2019-05-24 LAB — LIPID PANEL
Cholesterol: 164 mg/dL (ref 0–200)
HDL: 44.6 mg/dL (ref >39.00)
LDL Cholesterol: 80 mg/dL (ref 0–99)
NonHDL: 119.32
Total CHOL/HDL Ratio: 4
Triglycerides: 196 mg/dL — ABNORMAL HIGH (ref 0.0–149.0)
VLDL: 39.2 mg/dL (ref 0.0–40.0)

## 2019-05-24 LAB — COMPREHENSIVE METABOLIC PANEL
ALT: 13 U/L (ref 0–35)
AST: 13 U/L (ref 0–37)
Albumin: 4.3 g/dL (ref 3.5–5.2)
Alkaline Phosphatase: 83 U/L (ref 39–117)
BUN: 18 mg/dL (ref 6–23)
CO2: 27 mEq/L (ref 19–32)
Calcium: 8.8 mg/dL (ref 8.4–10.5)
Chloride: 105 mEq/L (ref 96–112)
Creatinine, Ser: 1.09 mg/dL (ref 0.40–1.20)
GFR: 49.09 mL/min — ABNORMAL LOW (ref >60.00)
Glucose, Bld: 88 mg/dL (ref 70–99)
Potassium: 4.3 mEq/L (ref 3.5–5.1)
Sodium: 141 mEq/L (ref 135–145)
Total Bilirubin: 0.4 mg/dL (ref 0.2–1.2)
Total Protein: 6.4 g/dL (ref 6.0–8.3)

## 2019-05-24 LAB — TSH: TSH: 1.48 u[IU]/mL (ref 0.35–4.50)

## 2019-05-24 LAB — FERRITIN: Ferritin: 8.8 ng/mL — ABNORMAL LOW (ref 10.0–291.0)

## 2019-05-24 LAB — HEMOGLOBIN A1C: Hgb A1c MFr Bld: 6 % (ref 4.6–6.5)

## 2019-05-26 ENCOUNTER — Other Ambulatory Visit: Payer: Self-pay

## 2019-05-26 ENCOUNTER — Ambulatory Visit (INDEPENDENT_AMBULATORY_CARE_PROVIDER_SITE_OTHER): Payer: Medicare HMO | Admitting: Family Medicine

## 2019-05-26 ENCOUNTER — Ambulatory Visit: Payer: Medicare HMO

## 2019-05-26 ENCOUNTER — Encounter: Payer: Self-pay | Admitting: Family Medicine

## 2019-05-26 VITALS — BP 128/82 | HR 66 | Temp 98.2°F | Ht 59.75 in | Wt 197.3 lb

## 2019-05-26 DIAGNOSIS — I4892 Unspecified atrial flutter: Secondary | ICD-10-CM

## 2019-05-26 DIAGNOSIS — E0590 Thyrotoxicosis, unspecified without thyrotoxic crisis or storm: Secondary | ICD-10-CM

## 2019-05-26 DIAGNOSIS — M85852 Other specified disorders of bone density and structure, left thigh: Secondary | ICD-10-CM

## 2019-05-26 DIAGNOSIS — D509 Iron deficiency anemia, unspecified: Secondary | ICD-10-CM | POA: Diagnosis not present

## 2019-05-26 DIAGNOSIS — Z6838 Body mass index (BMI) 38.0-38.9, adult: Secondary | ICD-10-CM

## 2019-05-26 DIAGNOSIS — E6609 Other obesity due to excess calories: Secondary | ICD-10-CM

## 2019-05-26 DIAGNOSIS — R7303 Prediabetes: Secondary | ICD-10-CM

## 2019-05-26 DIAGNOSIS — Z0000 Encounter for general adult medical examination without abnormal findings: Secondary | ICD-10-CM | POA: Diagnosis not present

## 2019-05-26 DIAGNOSIS — I5030 Unspecified diastolic (congestive) heart failure: Secondary | ICD-10-CM

## 2019-05-26 DIAGNOSIS — I10 Essential (primary) hypertension: Secondary | ICD-10-CM | POA: Diagnosis not present

## 2019-05-26 DIAGNOSIS — I4891 Unspecified atrial fibrillation: Secondary | ICD-10-CM | POA: Diagnosis not present

## 2019-05-26 DIAGNOSIS — Z1231 Encounter for screening mammogram for malignant neoplasm of breast: Secondary | ICD-10-CM | POA: Diagnosis not present

## 2019-05-26 MED ORDER — PAROXETINE HCL 10 MG PO TABS: 10.0000 mg | ORAL_TABLET | Freq: Every day | ORAL | 3 refills | Status: DC

## 2019-05-26 MED ORDER — FOSINOPRIL SODIUM 20 MG PO TABS: 20.0000 mg | ORAL_TABLET | Freq: Every day | ORAL | 3 refills | Status: DC

## 2019-05-26 NOTE — Assessment & Plan Note (Signed)
Scheduled annual screening mammogram Nl breast exam today  Encouraged monthly self exams   

## 2019-05-26 NOTE — Assessment & Plan Note (Signed)
Discussed how this problem influences overall health and the risks it imposes  Reviewed plan for weight loss with lower calorie diet (via better food choices and also portion control or program like weight watchers) and exercise building up to or more than 30 minutes 5 days per week including some aerobic activity    

## 2019-05-26 NOTE — Assessment & Plan Note (Signed)
Reviewed health habits including diet and exercise and skin cancer prevention Reviewed appropriate screening tests for age  Also reviewed health mt list, fam hx and immunization status , as well as social and family history   See HPI Labs reviewed  Mammogram referral done  Enc ca and D for bones Disc adv director No concerns about cognition  Enc exercise and wt loss

## 2019-05-26 NOTE — Assessment & Plan Note (Signed)
Hypothyroidism  Pt has no clinical changes No change in energy level/ hair or skin/ edema and no tremor Lab Results  Component Value Date   TSH 1.48 05/24/2019

## 2019-05-26 NOTE — Assessment & Plan Note (Signed)
Reviewed health habits including diet and exercise and skin cancer prevention Reviewed appropriate screening tests for age  Also reviewed health mt list, fam hx and immunization status , as well as social and family history   See HPI Labs reviewed  Mammogram referral done  Enc ca and D for bones Disc adv director No concerns about cognition  Enc exercise and wt loss - not motivated Overall doing well

## 2019-05-26 NOTE — Progress Notes (Signed)
Subjective:    Patient ID: Kerry Simpson, female    DOB: 03-Jun-1946, 73 y.o.   MRN: [id number]  HPI  I have personally reviewed the Medicare Annual Wellness questionnaire and have noted 1. The patient's medical and social history 2. Their use of alcohol, tobacco or illicit drugs 3. Their current medications and supplements 4. The patient's functional ability including ADL's, fall risks, home safety risks and hearing or visual             impairment. 5. Diet and physical activities 6. Evidence for depression or mood disorders  The patients weight, height, BMI have been recorded in the chart and visual acuity is per eye clinic.  I have made referrals, counseling and provided education to the patient based review of the above and I have provided the pt with a written personalized care plan for preventive services. Reviewed and updated provider list, see scanned forms.  See scanned forms.  Routine anticipatory guidance given to patient.  See health maintenance. Colon cancer screening colonoscopy 11/15  Breast cancer screening mammogram 8/19  Self breast exam-no lumps  Gyn health- no gyn problems  Flu vaccine 9/19- gets yearly  zostavax 1/15  Tetanus vaccine 04- not covered  Pneumovax finished both Dexa 8/19-osteopenia of L FN Worse in FN and better in spine last dexa Falls- none  Fractures-none  Supplements -takes ca and D Exercise - would like to start walking  Advance directive- has a living will POA (her poa is her husband)  Cognitive function addressed- see scanned forms- and if abnormal then additional documentation follows.  Not concerned  No more than normal aging - misplacing things/ walks into a room and forgets why  Not getting lost or confused  Knows president and date   PMH and [place] reviewed  Meds, vitals, and allergies reviewed.   ROS: See HPI.  Otherwise negative.    Wt Readings from Last 3 Encounters:  05/26/19 197 lb 5 oz (89.5 kg)  11/27/18 203 lb 12 oz  (92.4 kg)  10/07/18 199 lb 8 oz (90.5 kg)  down 6 lb  Needs to exercise  Eating healthy (but really likes carbs)  38.86 kg/m   PHQ: occ looses motivation or interest- rarely  Sad/hopeless - occ/not often  Mood is ok/tolerating the pandemic  Worries about her husband who farms    Hearing Screening   125Hz  250Hz  500Hz  1000Hz  2000Hz  3000Hz  4000Hz  6000Hz  8000Hz   Right ear:   40 40 40  40    Left ear:   40 40 40  40      Visual Acuity Screening   Right eye Left eye Both eyes  Without correction:     With correction: 20/25 20/25 20/25     H/o a flutter and CHF Taking xarelto Doing really well  Tolerating amiodarone  bp is stable today  No cp or palpitations or headaches or edema  No side effects to medicines  BP Readings from Last 3 Encounters:  05/26/19 128/82  11/27/18 128/78  10/07/18 120/82     Lab Results  Component Value Date   CREATININE 1.09 05/24/2019   BUN 18 05/24/2019   NA 141 05/24/2019   K 4.3 05/24/2019   CL 105 05/24/2019   CO2 27 05/24/2019   Lab Results  Component Value Date   ALT 13 05/24/2019   AST 13 05/24/2019   ALKPHOS 83 05/24/2019   BILITOT 0.4 05/24/2019   Lab Results  Component Value Date   WBC 6.5  05/24/2019   HGB 12.9 05/24/2019   HCT 40.2 05/24/2019   MCV 83.7 05/24/2019   PLT 257.0 05/24/2019    H/o hyperthyroid Kerry Simpson Lab Results  Component Value Date   TSH 1.48 05/24/2019   no problems No clinical changes  No goiter  No endocrinologist for years   Some issues with gastritis  Taking omeprazole (took protonix for a month)  Rarely takes aleve   Ferritin of 8  She took iron in the past but it upset her stomach GI told her to take mvi with iron   Prediabetes Lab Results  Component Value Date   HGBA1C 6.0 05/24/2019  she does eat sweets  May be addicted    Cholesterol  Lab Results  Component Value Date   CHOL 164 05/24/2019   CHOL 153 05/13/2018   CHOL 154 05/07/2017   Lab Results  Component Value Date    HDL 44.60 05/24/2019   HDL 50.40 05/13/2018   HDL 45.90 05/07/2017   Lab Results  Component Value Date   LDLCALC 80 05/24/2019   LDLCALC 77 05/13/2018   LDLCALC 81 05/07/2017   Lab Results  Component Value Date   TRIG 196.0 (H) 05/24/2019   TRIG 127.0 05/13/2018   TRIG 134.0 05/07/2017   Lab Results  Component Value Date   CHOLHDL 4 05/24/2019   CHOLHDL 3 05/13/2018   CHOLHDL 3 05/07/2017   No results found for: LDLDIRECT  Trig up - perhaps due to sugar/ prediabetes   Patient Active Problem List   Diagnosis Date Noted  . Prediabetes 05/23/2019  . Parotiditis 11/27/2018  . Iron deficiency anemia 05/23/2018  . Screening mammogram, encounter for 05/20/2018  . (HFpEF) heart failure with preserved ejection fraction ([place]) 05/19/2017  . GERD (gastroesophageal reflux disease) 08/20/2016  . DOE (dyspnea on exertion) 05/28/2016  . Sleep disturbance 05/28/2016  . Somnolence, daytime 05/09/2016  . Encounter for anticoagulation discussion and counseling 02/15/2016  . Atrial fibrillation and flutter ([place]) 02/14/2016  . Routine general medical examination at a health care facility 10/13/2015  . Estrogen deficiency 10/13/2015  . Fatigue 08/25/2015  . History of melena 12/12/2014  . Gallstone 12/07/2014  . Encounter for routine gynecological examination 04/14/2013  . Encounter for Medicare annual wellness exam 03/30/2013  . STRESS REACTION, ACUTE, WITH EMOTIONAL DISTURBANCE 11/26/2010  . Osteopenia 06/21/2009  . Hyperthyroidism 04/26/2008  . Obesity 04/26/2008  . Essential hypertension 04/26/2008   Past Medical History:  Diagnosis Date  . Anemia    iron def  . Atrial fibrillation ([place])    Dr Kerry Simpson  . Cataract 05/13/2018   both eyes per last vision exam   . Complication of anesthesia    "quit breathing" -Endoscopy( 12-22-14)  . Difficult intubation   . Fibrocystic breast   . Fracture of right shoulder   . Gallstones   . Grave's disease   . Graves disease   .  History of kidney stones    not a problem at present  . Hypertension   . Obesity   . Osteoporosis    osteopenia   Past Surgical History:  Procedure Laterality Date  . COLONOSCOPY  09/29/2014   Normal, Kerry Edis, MD  . ELECTROPHYSIOLOGIC STUDY N/A 04/12/2016   Procedure: CARDIOVERSION;  Surgeon: Minna Merritts, MD;  Location: [facility] ORS;  Service: Cardiovascular;  Laterality: N/A;  . ESOPHAGOGASTRODUODENOSCOPY (EGD) WITH PROPOFOL N/A 09/08/2018   Procedure: ESOPHAGOGASTRODUODENOSCOPY (EGD) WITH PROPOFOL;  Surgeon: Lollie Sails, MD;  Location: [facility] ENDOSCOPY;  Service: Endoscopy;  Laterality: N/A;  . ESOPHAGOGASTRODUODENOSCOPY ENDOSCOPY    . EUS N/A 01/12/2015   Procedure: UPPER ENDOSCOPIC ULTRASOUND (EUS) LINEAR;  Surgeon: Milus Banister, MD;  Location: [facility] ENDOSCOPY;  Service: Endoscopy;  Laterality: N/A;  . TUBAL LIGATION     Social History   Tobacco Use  . Smoking status: Never Smoker  . Smokeless tobacco: Never Used  Substance Use Topics  . Alcohol use: No    Alcohol/week: 0.0 standard drinks  . Drug use: No   Family History  Problem Relation Age of Onset  . Hypertension Mother   . Diabetes Father   . Hypertension Father   . Hypertension Brother   . Cancer Brother        prostate  . Heart disease Brother        with bypass  . Colon cancer Neg Hx    No Known Allergies Current Outpatient Medications on File Prior to Visit  Medication Sig Dispense Refill  . acetaminophen (TYLENOL) 500 MG tablet Take 1,000 mg by mouth daily as needed.     Marland Kitchen amiodarone (PACERONE) 200 MG tablet TAKE 1 TABLET BY MOUTH DAILY 90 tablet 1  . amLODipine (NORVASC) 5 MG tablet TAKE 1 TABLET BY MOUTH EVERY DAY 30 tablet 3  . Cholecalciferol (VITAMIN D3 PO) Take 400 Units by mouth daily.    . Cyanocobalamin (VITAMIN B 12 PO) Take by mouth daily.    . Multiple Vitamin (MULTIVITAMIN) capsule Take 1 capsule by mouth daily.    Marland Kitchen omeprazole (PRILOSEC) 20 MG capsule Take 20 mg by mouth daily.     Alveda Reasons 20 MG TABS tablet TAKE 1 TABLET BY MOUTH ONCE DAILY WITH SUPPER 30 tablet 2  . furosemide (LASIX) 20 MG tablet Take 1 tablet (20 mg total) by mouth daily as needed. (Patient not taking: Reported on 05/26/2019) 30 tablet 3   No current facility-administered medications on file prior to visit.      Review of Systems  Constitutional: Negative for activity change, appetite change, fatigue, fever and unexpected weight change.  HENT: Negative for congestion, ear pain, rhinorrhea, sinus pressure and sore throat.   Eyes: Negative for pain, redness and visual disturbance.  Respiratory: Negative for cough, shortness of breath and wheezing.   Cardiovascular: Negative for chest pain and palpitations.  Gastrointestinal: Negative for abdominal pain, anal bleeding, blood in stool, constipation and diarrhea.  Endocrine: Negative for polydipsia and polyuria.  Genitourinary: Negative for dysuria, frequency, hematuria and urgency.  Musculoskeletal: Positive for arthralgias. Negative for back pain and myalgias.  Skin: Negative for pallor and rash.  Allergic/Immunologic: Negative for environmental allergies.  Neurological: Negative for dizziness, syncope and headaches.  Hematological: Negative for adenopathy. Does not bruise/bleed easily.  Psychiatric/Behavioral: Negative for decreased concentration and dysphoric mood. The patient is not nervous/anxious.        Objective:   Physical Exam Constitutional:      General: She is not in acute distress.    Appearance: Normal appearance. She is well-developed. She is obese. She is not ill-appearing or diaphoretic.  HENT:     Head: Normocephalic and atraumatic.     Right Ear: Tympanic membrane, ear canal and external ear normal.     Left Ear: Tympanic membrane, ear canal and external ear normal.     Nose: Nose normal.     Mouth/Throat:     Mouth: Mucous membranes are moist.     Pharynx: Oropharynx is clear. No posterior oropharyngeal erythema.   Eyes:  General: No scleral icterus.    Conjunctiva/sclera: Conjunctivae normal.     Pupils: Pupils are equal, round, and reactive to light.  Neck:     Musculoskeletal: Normal range of motion and neck supple.     Thyroid: No thyromegaly.     Vascular: No carotid bruit or JVD.  Cardiovascular:     Rate and Rhythm: Normal rate and regular rhythm.     Pulses: Normal pulses.     Heart sounds: Normal heart sounds. No gallop.   Pulmonary:     Effort: Pulmonary effort is normal. No respiratory distress.     Breath sounds: Normal breath sounds. No wheezing.  Chest:     Chest wall: No tenderness.  Abdominal:     General: Bowel sounds are normal. There is no distension or abdominal bruit.     Palpations: Abdomen is soft. There is no mass.     Tenderness: There is no abdominal tenderness.     Hernia: No hernia is present.  Genitourinary:    Comments: Breast exam: No mass, nodules, thickening, tenderness, bulging, retraction, inflamation, nipple discharge or skin changes noted.  No axillary or clavicular LA.     Musculoskeletal: Normal range of motion.        General: No tenderness.     Right lower leg: No edema.     Left lower leg: No edema.  Lymphadenopathy:     Cervical: No cervical adenopathy.  Skin:    General: Skin is warm and dry.     Coloration: Skin is not pale.     Findings: No erythema or rash.  Neurological:     Mental Status: She is alert.     Cranial Nerves: No cranial nerve deficit.     Motor: No abnormal muscle tone.     Coordination: Coordination normal.     Gait: Gait normal.     Deep Tendon Reflexes: Reflexes are normal and symmetric. Reflexes normal.  Psychiatric:        Mood and Affect: Mood normal.        Cognition and Memory: Cognition and memory normal.           Assessment & Plan:   Problem List Items Addressed This Visit      Cardiovascular and Mediastinum   Essential hypertension    bp in fair control at this time  BP Readings from Last 1  Encounters:  05/26/19 128/82   No changes needed Most recent labs reviewed  Disc lifstyle change with low sodium diet and exercise        Relevant Medications   fosinopril (MONOPRIL) 20 MG tablet   Atrial fibrillation and flutter ([facility])    Clinically stable- with amiodarone and xarelto      Relevant Medications   fosinopril (MONOPRIL) 20 MG tablet   (HFpEF) heart failure with preserved ejection fraction ([facility])    Clinically stable      Relevant Medications   fosinopril (MONOPRIL) 20 MG tablet     Endocrine   Hyperthyroidism    Hypothyroidism  Pt has no clinical changes No change in energy level/ hair or skin/ edema and no tremor Lab Results  Component Value Date   TSH 1.48 05/24/2019            Musculoskeletal and Integument   Osteopenia    Rev dexa 8/19 No falls or fx Disc need for calcium/ vitamin D/ wt bearing exercise and bone density test every 2 y to monitor Disc safety/ fracture risk in  detail          Other   Obesity    Discussed how this problem influences overall health and the risks it imposes  Reviewed plan for weight loss with lower calorie diet (via better food choices and also portion control or program like weight watchers) and exercise building up to or more than 30 minutes 5 days per week including some aerobic activity         Encounter for Medicare annual wellness exam - Primary    Reviewed health habits including diet and exercise and skin cancer prevention Reviewed appropriate screening tests for age  Also reviewed health mt list, fam hx and immunization status , as well as social and family history   See HPI Labs reviewed  Mammogram referral done  Enc ca and D for bones Disc Forensic psychologist No concerns about cognition  Enc exercise and wt loss        Routine general medical examination at a health care facility    Reviewed health habits including diet and exercise and skin cancer prevention Reviewed appropriate screening tests for  age  Also reviewed health mt list, fam hx and immunization status , as well as social and family history   See HPI Labs reviewed  Mammogram referral done  Enc ca and D for bones Disc Forensic psychologist No concerns about cognition  Enc exercise and wt loss - not motivated Overall doing well      Screening mammogram, encounter for    Scheduled annual screening mammogram Nl breast exam today  Encouraged monthly self exams        Relevant Orders   MM 3D SCREEN BREAST BILATERAL   Iron deficiency anemia    Nl cbc but low ferritin of 8 Takes mvi with iron at adv of GI Gastritis is improved      Prediabetes    Lab Results  Component Value Date   HGBA1C 6.0 05/24/2019   disc imp of low glycemic diet and wt loss to prevent DM2

## 2019-05-26 NOTE — Assessment & Plan Note (Signed)
Clinically stable. 

## 2019-05-26 NOTE — Assessment & Plan Note (Signed)
Nl cbc but low ferritin of 8 Takes mvi with iron at adv of GI Gastritis is improved

## 2019-05-26 NOTE — Patient Instructions (Addendum)
The office will call you to set up a mammogram   Try to get most of your carbohydrates from produce (with the exception of white potatoes)  Eat less bread/pasta/rice/snack foods/cereals/sweets and other items from the middle of the grocery store (processed carbs)  You may need to quit sweets if you think you severely crave them   Take good care of yourself and stay safe   Think about some exercise

## 2019-05-26 NOTE — Assessment & Plan Note (Signed)
Rev dexa 8/19 No falls or fx Disc need for calcium/ vitamin D/ wt bearing exercise and bone density test every 2 y to monitor Disc safety/ fracture risk in detail

## 2019-05-26 NOTE — Assessment & Plan Note (Signed)
Clinically stable- with amiodarone and xarelto

## 2019-05-26 NOTE — Assessment & Plan Note (Signed)
Lab Results  Component Value Date   HGBA1C 6.0 05/24/2019   disc imp of low glycemic diet and wt loss to prevent DM2

## 2019-05-26 NOTE — Assessment & Plan Note (Signed)
bp in fair control at this time  BP Readings from Last 1 Encounters:  05/26/19 128/82   No changes needed Most recent labs reviewed  Disc lifstyle change with low sodium diet and exercise

## 2019-05-29 ENCOUNTER — Other Ambulatory Visit: Payer: Self-pay | Admitting: Family Medicine

## 2019-05-31 ENCOUNTER — Other Ambulatory Visit: Payer: Self-pay | Admitting: Cardiovascular Disease

## 2019-05-31 NOTE — Telephone Encounter (Signed)
Please review for refill.  

## 2019-05-31 NOTE — Telephone Encounter (Signed)
Pt's wt 89.5 kg, age 73, SCr 1.09, CrCl 64.95.

## 2019-06-25 DIAGNOSIS — R69 Illness, unspecified: Secondary | ICD-10-CM | POA: Diagnosis not present

## 2019-07-14 ENCOUNTER — Other Ambulatory Visit: Payer: Self-pay

## 2019-07-14 ENCOUNTER — Ambulatory Visit
Admission: RE | Admit: 2019-07-14 | Discharge: 2019-07-14 | Disposition: A | Discharge status: Home / Self Care | Payer: Medicare HMO | Source: Ambulatory Visit | Attending: Family Medicine | Admitting: Family Medicine

## 2019-07-14 DIAGNOSIS — Z1231 Encounter for screening mammogram for malignant neoplasm of breast: Secondary | ICD-10-CM

## 2019-07-15 ENCOUNTER — Other Ambulatory Visit: Payer: Self-pay | Admitting: Family Medicine

## 2019-07-15 DIAGNOSIS — R928 Other abnormal and inconclusive findings on diagnostic imaging of breast: Secondary | ICD-10-CM

## 2019-07-21 ENCOUNTER — Ambulatory Visit: Payer: Medicare HMO

## 2019-07-21 ENCOUNTER — Other Ambulatory Visit: Payer: Self-pay

## 2019-07-21 ENCOUNTER — Ambulatory Visit
Admission: RE | Admit: 2019-07-21 | Discharge: 2019-07-21 | Disposition: A | Discharge status: Home / Self Care | Payer: Medicare HMO | Source: Ambulatory Visit | Attending: Family Medicine | Admitting: Family Medicine

## 2019-07-21 DIAGNOSIS — R928 Other abnormal and inconclusive findings on diagnostic imaging of breast: Secondary | ICD-10-CM | POA: Diagnosis not present

## 2019-08-08 ENCOUNTER — Other Ambulatory Visit: Payer: Self-pay | Admitting: Family Medicine

## 2019-09-04 ENCOUNTER — Other Ambulatory Visit: Payer: Self-pay | Admitting: Family Medicine

## 2019-09-07 ENCOUNTER — Other Ambulatory Visit: Payer: Self-pay | Admitting: Cardiovascular Disease

## 2019-09-08 ENCOUNTER — Other Ambulatory Visit: Payer: Self-pay | Admitting: Cardiovascular Disease

## 2019-09-08 ENCOUNTER — Other Ambulatory Visit: Payer: Self-pay | Admitting: Family Medicine

## 2019-09-11 ENCOUNTER — Other Ambulatory Visit: Payer: Self-pay | Admitting: Family Medicine

## 2019-10-02 ENCOUNTER — Other Ambulatory Visit: Payer: Self-pay | Admitting: Cardiovascular Disease

## 2019-10-08 ENCOUNTER — Other Ambulatory Visit: Payer: Self-pay | Admitting: Family Medicine

## 2019-10-30 ENCOUNTER — Other Ambulatory Visit: Payer: Self-pay | Admitting: Cardiovascular Disease

## 2019-11-03 ENCOUNTER — Other Ambulatory Visit: Payer: Self-pay | Admitting: Cardiovascular Disease

## 2019-11-26 ENCOUNTER — Other Ambulatory Visit: Payer: Self-pay | Admitting: Cardiovascular Disease

## 2019-11-27 ENCOUNTER — Other Ambulatory Visit: Payer: Self-pay | Admitting: Cardiovascular Disease

## 2019-11-29 NOTE — Telephone Encounter (Signed)
Refill Request.  

## 2019-11-29 NOTE — Telephone Encounter (Signed)
Pt's age 74, wt 89.5 kg, SCr 1.09, CrCl 63.98, last appt w/ TG 10/07/18- overdue - needs appt for further refills.

## 2019-11-30 ENCOUNTER — Ambulatory Visit: Payer: Medicare Other | Attending: Internal Medicine

## 2019-11-30 DIAGNOSIS — Z23 Encounter for immunization: Secondary | ICD-10-CM

## 2019-11-30 NOTE — Progress Notes (Signed)
   Covid-19 Vaccination Clinic  Name:  Kerry Simpson    MRN: [id number] DOB: [birth year]  11/30/2019  [name] was observed post Covid-19 immunization for 15 minutes without incidence. She was provided with Vaccine Information Sheet and instruction to access the V-Safe system.   [name] was instructed to call 911 with any severe reactions post vaccine: Marland Kitchen Difficulty breathing  . Swelling of your face and throat  . A fast heartbeat  . A bad rash all over your body  . Dizziness and weakness    Immunizations Administered    Name Date Dose VIS Date Route   Pfizer COVID-19 Vaccine 11/30/2019  5:20 PM 0.3 mL 10/22/2019 Intramuscular   Manufacturer: [place]   Lot: [id number]   [place]: [id number]

## 2019-12-07 ENCOUNTER — Other Ambulatory Visit: Payer: Self-pay | Admitting: Cardiovascular Disease

## 2019-12-22 ENCOUNTER — Ambulatory Visit: Payer: Medicare HMO | Attending: Internal Medicine

## 2019-12-22 DIAGNOSIS — Z23 Encounter for immunization: Secondary | ICD-10-CM | POA: Insufficient documentation

## 2019-12-22 NOTE — Progress Notes (Signed)
   Covid-19 Vaccination Clinic  Name:  Kerry Simpson    MRN: [id number] DOB: 05-07-1946  12/22/2019  Kerry Simpson was observed post Covid-19 immunization for 15 minutes without incidence. She was provided with Vaccine Information Sheet and instruction to access the V-Safe system.   Kerry Simpson was instructed to call 911 with any severe reactions post vaccine: Marland Kitchen Difficulty breathing  . Swelling of your face and throat  . A fast heartbeat  . A bad rash all over your body  . Dizziness and weakness    Immunizations Administered    Name Date Dose VIS Date Route   Pfizer COVID-19 Vaccine 12/22/2019  9:35 AM 0.3 mL 10/22/2019 Intramuscular   Manufacturer: [employer]   Lot: [id number]   [place]: [id number]

## 2020-01-08 ENCOUNTER — Other Ambulatory Visit: Payer: Self-pay | Admitting: Cardiovascular Disease

## 2020-01-18 NOTE — Progress Notes (Signed)
Cardiology Office Note  Date:  01/19/2020   ID:  BETHENNY PALELLA, DOB Mar 01, 1946, MRN [id number]  PCP:  Abner Greenspan, MD   Chief Complaint  Patient presents with  . office visit    Pt states has no complaints. Meds verbally reviewed w/ pt.    HPI:  [name] is a pleasant 74 year old woman with history of  obesity,  hypertension,  GERD  who presents for routine follow-up for persistent atrial fibrillation  In follow-up today she reports that she has been relatively sedentary Weight trending upwards Weight on prior clinic visit 199 pounds, now up to 212.8 pounds No exercise program  No tachycardia  chronic fatigue, sleeps ok Not a smoker Very rare lasix use, no significant leg edema  At night, head itches Ankles with three areas of circular rash, very itchy at night Tried cortisone, does not work  Labs reviewed with her in detail HBA1C 6.0 totaL CHOL 164,  Ldl 80 HCT 40  EKG personally reviewed by myself on todays visit shows normal sinus rhythm with rate 70 bpm, poor R-wave progression through the anterior precordial leads, left axis deviation  Other past medical history reviewed Echo 02/2016: EF 60%, RVSp 38  Started on amio 03/2016: CHADS-VASc score is greater than or equal to 3 (hypertension, age, gender)  Seen by dr. [name] 04/2016:  Tachy/brady digoxin held sleep disordered breathing and evidence of HFpEF.  Does not think she has OSA Cancelled sleep study Previous cardioversion cancelled, she converted on her own on amio  Echocardiogram  showing normal LV function, mildly elevated right heart pressures   PMH:   has a past medical history of Anemia, Atrial fibrillation ([place]), Cataract ([id number]), Complication of anesthesia, Difficult intubation, Fibrocystic breast, Fracture of right shoulder, Gallstones, Grave's disease, Graves disease, History of kidney stones, Hypertension, Obesity, and Osteoporosis.  PSH:    Past Surgical History:   Procedure Laterality Date  . COLONOSCOPY  09/29/2014   Normal, Delfin Edis, MD  . ELECTROPHYSIOLOGIC STUDY N/A 04/12/2016   Procedure: CARDIOVERSION;  Surgeon: Minna Merritts, MD;  Location: [facility] ORS;  Service: Cardiovascular;  Laterality: N/A;  . ESOPHAGOGASTRODUODENOSCOPY (EGD) WITH PROPOFOL N/A 09/08/2018   Procedure: ESOPHAGOGASTRODUODENOSCOPY (EGD) WITH PROPOFOL;  Surgeon: Lollie Sails, MD;  Location: [facility] ENDOSCOPY;  Service: Endoscopy;  Laterality: N/A;  . ESOPHAGOGASTRODUODENOSCOPY ENDOSCOPY    . EUS N/A 01/12/2015   Procedure: UPPER ENDOSCOPIC ULTRASOUND (EUS) LINEAR;  Surgeon: Milus Banister, MD;  Location: [facility] ENDOSCOPY;  Service: Endoscopy;  Laterality: N/A;  . TUBAL LIGATION      Current Outpatient Medications  Medication Sig Dispense Refill  . acetaminophen (TYLENOL) 500 MG tablet Take 1,000 mg by mouth daily as needed.     Marland Kitchen amiodarone (PACERONE) 200 MG tablet TAKE 1 TABLET BY MOUTH EVERY DAY 30 tablet 0  . amLODipine (NORVASC) 5 MG tablet TAKE 1 TABLET BY MOUTH EVERY DAY 30 tablet 0  . Cholecalciferol (VITAMIN D3 PO) Take 400 Units by mouth daily.    . Cyanocobalamin (VITAMIN B 12 PO) Take by mouth daily.    . fosinopril (MONOPRIL) 20 MG tablet TAKE 1 TABLET BY MOUTH DAILY 90 tablet 2  . furosemide (LASIX) 20 MG tablet Take 1 tablet (20 mg total) by mouth daily as needed. 30 tablet 3  . Multiple Vitamin (MULTIVITAMIN) capsule Take 1 capsule by mouth daily.    Marland Kitchen omeprazole (PRILOSEC) 20 MG capsule Take 20 mg by mouth daily.    Marland Kitchen PARoxetine (PAXIL) 10 MG  tablet TAKE 1 TABLET BY MOUTH DAILY 90 tablet 0  . rivaroxaban (XARELTO) 20 MG TABS tablet Take 1 tablet (20 mg total) by mouth daily with supper. NEEDS APPT W/ DR. Rockey Situ FOR MORE REFILLS. 30 tablet 0   No current facility-administered medications for this visit.     Allergies:   Patient has no known allergies.   Social History:  The patient  reports that she has never smoked. She has never used smokeless  tobacco. She reports that she does not drink alcohol or use drugs.   Family History:   family history includes Cancer in her brother; Diabetes in her father; Heart disease in her brother; Hypertension in her brother, father, and mother.    Review of Systems: Review of Systems  Constitutional: Negative.   Respiratory: Negative.   Cardiovascular: Negative.   Gastrointestinal: Negative.   Musculoskeletal: Negative.   Neurological: Negative.   Psychiatric/Behavioral: Negative.   All other systems reviewed and are negative.    PHYSICAL EXAM: VS:  BP 140/86 (BP Location: Left Arm, Patient Position: Sitting, Cuff Size: Normal)   Pulse 70   Ht 5' (1.524 m)   Wt 212 lb 8 oz (96.4 kg)   SpO2 94%   BMI 41.50 kg/m  , BMI Body mass index is 41.5 kg/m. Constitutional:  oriented to person, place, and time. No distress.  HENT:  Head: Grossly normal Eyes:  no discharge. No scleral icterus.  Neck: No JVD, no carotid bruits  Cardiovascular: Regular rate and rhythm, no murmurs appreciated Pulmonary/Chest: Clear to auscultation bilaterally, no wheezes or rails Abdominal: Soft.  no distension.  no tenderness.  Musculoskeletal: Normal range of motion Neurological:  normal muscle tone. Coordination normal. No atrophy Skin: Skin warm and dry Psychiatric: normal affect, pleasant   Recent Labs: 05/24/2019: ALT 13; BUN 18; Creatinine, Ser 1.09; Hemoglobin 12.9; Platelets 257.0; Potassium 4.3; Sodium 141; TSH 1.48    Lipid Panel Lab Results  Component Value Date   CHOL 164 05/24/2019   HDL 44.60 05/24/2019   LDLCALC 80 05/24/2019   TRIG 196.0 (H) 05/24/2019      Wt Readings from Last 3 Encounters:  01/19/20 212 lb 8 oz (96.4 kg)  05/26/19 197 lb 5 oz (89.5 kg)  11/27/18 203 lb 12 oz (92.4 kg)       ASSESSMENT AND PLAN:  Essential hypertension - Plan: EKG 12-Lead Blood pressure mildly elevated on today's visit, no medication changes made Recommend close monitoring at home, walking  program, lifestyle modification  Atrial fibrillation and flutter ([place]) - Plan: EKG 12-Lead On anticoagulation, rate control with amiodarone Sick sinus syndrome, not on beta-blocker Denies any significant paroxysmal tachycardia concerning for atrial fibrillation  Class 2 obesity due to excess calories without serious comorbidity with body mass index (BMI) of 37.0 to 37.9 in adult Recommended regular walking program Does mostly paperwork, [place] Stressed importance of activity  DOE (dyspnea on exertion) Secondary to obesity, deconditioning No further work-up needed  Chest pain, unspecified type Denies any episodes, no further work-up at this time Stable  Sleep disturbance Reports sleeping relatively well   Total encounter time more than 25 minutes  Greater than 50% was spent in counseling and coordination of care with the patient  Disposition:   F/U  12 months   No orders of the defined types were placed in this encounter.    Signed, Esmond Plants, M.D., Ph.D. 01/19/2020  [place], [place]

## 2020-01-19 ENCOUNTER — Encounter: Payer: Self-pay | Admitting: Cardiovascular Disease

## 2020-01-19 ENCOUNTER — Other Ambulatory Visit: Payer: Self-pay

## 2020-01-19 ENCOUNTER — Ambulatory Visit (INDEPENDENT_AMBULATORY_CARE_PROVIDER_SITE_OTHER): Payer: Medicare HMO | Admitting: Cardiovascular Disease

## 2020-01-19 VITALS — BP 140/86 | HR 70 | Ht 60.0 in | Wt 212.5 lb

## 2020-01-19 DIAGNOSIS — I5030 Unspecified diastolic (congestive) heart failure: Secondary | ICD-10-CM

## 2020-01-19 DIAGNOSIS — I4892 Unspecified atrial flutter: Secondary | ICD-10-CM

## 2020-01-19 DIAGNOSIS — I10 Essential (primary) hypertension: Secondary | ICD-10-CM | POA: Diagnosis not present

## 2020-01-19 DIAGNOSIS — I4891 Unspecified atrial fibrillation: Secondary | ICD-10-CM | POA: Diagnosis not present

## 2020-01-19 NOTE — Patient Instructions (Signed)

## 2020-01-26 ENCOUNTER — Other Ambulatory Visit: Payer: Self-pay | Admitting: Cardiovascular Disease

## 2020-01-26 NOTE — Telephone Encounter (Signed)
Please review for refill. Thanks!  

## 2020-02-08 ENCOUNTER — Other Ambulatory Visit: Payer: Self-pay | Admitting: Cardiovascular Disease

## 2020-02-09 ENCOUNTER — Other Ambulatory Visit: Payer: Self-pay | Admitting: Family Medicine

## 2020-04-14 ENCOUNTER — Other Ambulatory Visit: Payer: Self-pay | Admitting: Family Medicine

## 2020-05-21 NOTE — Telephone Encounter (Signed)
For weight loss would recommend meeting with a nutritionist #1 #2 would be get into a exercise program.  That could mean getting with a personal trainer, joining a gym, getting other family members involved Create a schedule to get out of the house several times a week walking [employer] etc.  For arthritic pain as she is on a blood thinner options are limited She could talk with primary care, perhaps low-dose meloxicam sparingly could be used or Celebrex sparingly

## 2020-05-23 ENCOUNTER — Other Ambulatory Visit: Payer: Self-pay | Admitting: Family Medicine

## 2020-05-23 NOTE — Telephone Encounter (Signed)
No recent or future appts., please advise  

## 2020-05-23 NOTE — Telephone Encounter (Signed)
Please schedule an annual exam and refill until then

## 2020-05-24 NOTE — Telephone Encounter (Signed)
Med refilled once and Morey Hummingbird will reach out to pt to try and get CPE/ AWV scheduled

## 2020-05-29 NOTE — Telephone Encounter (Signed)
I left a detailed message for patient to call back and schedule appointment.

## 2020-06-12 ENCOUNTER — Other Ambulatory Visit: Payer: Self-pay | Admitting: Family Medicine

## 2020-06-14 ENCOUNTER — Other Ambulatory Visit: Payer: Self-pay | Admitting: Family Medicine

## 2020-06-14 NOTE — Telephone Encounter (Signed)
Carrie left VM on 05/29/20 letting pt know she needs to schedule AWV/CPE, she hasn't yet Rx declined until pt calls and scheduled her AWV/CPE for this year

## 2020-06-16 ENCOUNTER — Other Ambulatory Visit: Payer: Self-pay | Admitting: Family Medicine

## 2020-06-26 ENCOUNTER — Encounter: Payer: Self-pay | Admitting: Family Medicine

## 2020-06-27 ENCOUNTER — Telehealth: Payer: Self-pay | Admitting: Family Medicine

## 2020-06-27 ENCOUNTER — Ambulatory Visit: Payer: Medicare HMO

## 2020-06-27 DIAGNOSIS — D509 Iron deficiency anemia, unspecified: Secondary | ICD-10-CM

## 2020-06-27 DIAGNOSIS — E0590 Thyrotoxicosis, unspecified without thyrotoxic crisis or storm: Secondary | ICD-10-CM

## 2020-06-27 DIAGNOSIS — R7303 Prediabetes: Secondary | ICD-10-CM

## 2020-06-27 DIAGNOSIS — I10 Essential (primary) hypertension: Secondary | ICD-10-CM

## 2020-06-27 NOTE — Telephone Encounter (Signed)
-----   Message from Cloyd Stagers, RT sent at 06/15/2020 10:20 AM EDT ----- Regarding: Lab Orders for Wednesday 8.18.2021 Please place lab orders for Wednesday 8.18.2021, office visit for physical on Friday 8.20.2021 Thank you, Dyke Maes RT(R)

## 2020-06-28 ENCOUNTER — Other Ambulatory Visit (INDEPENDENT_AMBULATORY_CARE_PROVIDER_SITE_OTHER): Payer: Medicare HMO

## 2020-06-28 ENCOUNTER — Other Ambulatory Visit: Payer: Self-pay

## 2020-06-28 DIAGNOSIS — E0590 Thyrotoxicosis, unspecified without thyrotoxic crisis or storm: Secondary | ICD-10-CM | POA: Diagnosis not present

## 2020-06-28 DIAGNOSIS — R7303 Prediabetes: Secondary | ICD-10-CM | POA: Diagnosis not present

## 2020-06-28 DIAGNOSIS — I10 Essential (primary) hypertension: Secondary | ICD-10-CM | POA: Diagnosis not present

## 2020-06-28 DIAGNOSIS — D509 Iron deficiency anemia, unspecified: Secondary | ICD-10-CM | POA: Diagnosis not present

## 2020-06-28 LAB — LIPID PANEL
Cholesterol: 141 mg/dL (ref 0–200)
HDL: 53.8 mg/dL (ref >39.00)
LDL Cholesterol: 67 mg/dL (ref 0–99)
NonHDL: 87.27
Total CHOL/HDL Ratio: 3
Triglycerides: 102 mg/dL (ref 0.0–149.0)
VLDL: 20.4 mg/dL (ref 0.0–40.0)

## 2020-06-28 LAB — COMPREHENSIVE METABOLIC PANEL
ALT: 17 U/L (ref 0–35)
AST: 17 U/L (ref 0–37)
Albumin: 4.1 g/dL (ref 3.5–5.2)
Alkaline Phosphatase: 88 U/L (ref 39–117)
BUN: 12 mg/dL (ref 6–23)
CO2: 29 mEq/L (ref 19–32)
Calcium: 9.1 mg/dL (ref 8.4–10.5)
Chloride: 103 mEq/L (ref 96–112)
Creatinine, Ser: 1.06 mg/dL (ref 0.40–1.20)
GFR: 50.54 mL/min — ABNORMAL LOW (ref >60.00)
Glucose, Bld: 106 mg/dL — ABNORMAL HIGH (ref 70–99)
Potassium: 4.3 mEq/L (ref 3.5–5.1)
Sodium: 140 mEq/L (ref 135–145)
Total Bilirubin: 0.5 mg/dL (ref 0.2–1.2)
Total Protein: 6.5 g/dL (ref 6.0–8.3)

## 2020-06-28 LAB — CBC WITH DIFFERENTIAL/PLATELET
Basophils Absolute: 0.1 10*3/uL (ref 0.0–0.1)
Basophils Relative: 1.3 % (ref 0.0–3.0)
Eosinophils Absolute: 0.2 10*3/uL (ref 0.0–0.7)
Eosinophils Relative: 2.3 % (ref 0.0–5.0)
HCT: 37.5 % (ref 36.0–46.0)
Hemoglobin: 11.9 g/dL — ABNORMAL LOW (ref 12.0–15.0)
Lymphocytes Relative: 20.3 % (ref 12.0–46.0)
Lymphs Abs: 1.4 10*3/uL (ref 0.7–4.0)
MCHC: 31.7 g/dL (ref 30.0–36.0)
MCV: 80.4 fl (ref 78.0–100.0)
Monocytes Absolute: 0.5 10*3/uL (ref 0.1–1.0)
Monocytes Relative: 8.1 % (ref 3.0–12.0)
Neutro Abs: 4.6 10*3/uL (ref 1.4–7.7)
Neutrophils Relative %: 68 % (ref 43.0–77.0)
Platelets: 283 10*3/uL (ref 150.0–400.0)
RBC: 4.66 Mil/uL (ref 3.87–5.11)
RDW: 16.9 % — ABNORMAL HIGH (ref 11.5–15.5)
WBC: 6.7 10*3/uL (ref 4.0–10.5)

## 2020-06-28 LAB — FERRITIN: Ferritin: 6.9 ng/mL — ABNORMAL LOW (ref 10.0–291.0)

## 2020-06-28 LAB — TSH: TSH: 1.54 u[IU]/mL (ref 0.35–4.50)

## 2020-06-28 LAB — HEMOGLOBIN A1C: Hgb A1c MFr Bld: 6.2 % (ref 4.6–6.5)

## 2020-06-29 ENCOUNTER — Other Ambulatory Visit: Payer: Self-pay | Admitting: Family Medicine

## 2020-06-29 DIAGNOSIS — Z1231 Encounter for screening mammogram for malignant neoplasm of breast: Secondary | ICD-10-CM

## 2020-06-30 ENCOUNTER — Ambulatory Visit (INDEPENDENT_AMBULATORY_CARE_PROVIDER_SITE_OTHER): Payer: Medicare HMO | Admitting: Family Medicine

## 2020-06-30 ENCOUNTER — Other Ambulatory Visit: Payer: Self-pay

## 2020-06-30 ENCOUNTER — Encounter: Payer: Self-pay | Admitting: Family Medicine

## 2020-06-30 VITALS — BP 131/70 | HR 72 | Temp 97.4°F | Ht 60.0 in | Wt 218.4 lb

## 2020-06-30 DIAGNOSIS — E0590 Thyrotoxicosis, unspecified without thyrotoxic crisis or storm: Secondary | ICD-10-CM

## 2020-06-30 DIAGNOSIS — I10 Essential (primary) hypertension: Secondary | ICD-10-CM

## 2020-06-30 DIAGNOSIS — M85852 Other specified disorders of bone density and structure, left thigh: Secondary | ICD-10-CM

## 2020-06-30 DIAGNOSIS — D509 Iron deficiency anemia, unspecified: Secondary | ICD-10-CM | POA: Diagnosis not present

## 2020-06-30 DIAGNOSIS — R4589 Other symptoms and signs involving emotional state: Secondary | ICD-10-CM

## 2020-06-30 DIAGNOSIS — R69 Illness, unspecified: Secondary | ICD-10-CM | POA: Diagnosis not present

## 2020-06-30 DIAGNOSIS — E2839 Other primary ovarian failure: Secondary | ICD-10-CM | POA: Diagnosis not present

## 2020-06-30 DIAGNOSIS — R7303 Prediabetes: Secondary | ICD-10-CM

## 2020-06-30 DIAGNOSIS — I4891 Unspecified atrial fibrillation: Secondary | ICD-10-CM

## 2020-06-30 DIAGNOSIS — I4892 Unspecified atrial flutter: Secondary | ICD-10-CM

## 2020-06-30 DIAGNOSIS — Z0000 Encounter for general adult medical examination without abnormal findings: Secondary | ICD-10-CM | POA: Diagnosis not present

## 2020-06-30 MED ORDER — FOSINOPRIL SODIUM 20 MG PO TABS: 20.0000 mg | ORAL_TABLET | Freq: Every day | ORAL | 3 refills | Status: DC

## 2020-06-30 MED ORDER — OMEPRAZOLE 20 MG PO CPDR: 20.0000 mg | DELAYED_RELEASE_CAPSULE | Freq: Every day | ORAL | 3 refills | Status: DC

## 2020-06-30 MED ORDER — PAROXETINE HCL 10 MG PO TABS: 10.0000 mg | ORAL_TABLET | Freq: Every day | ORAL | 3 refills | Status: DC

## 2020-06-30 NOTE — Assessment & Plan Note (Signed)
Mild  Intol of ferrous sulfate from GI so taking mvi with iron

## 2020-06-30 NOTE — Progress Notes (Signed)
Subjective:    Patient ID: Kerry Simpson, female    DOB: [birth year], 74 y.o.   MRN: [id number]  This visit occurred during the SARS-CoV-2 public health emergency.  Safety protocols were in place, including screening questions prior to the visit, additional usage of staff PPE, and extensive cleaning of exam room while observing appropriate contact time as indicated for disinfecting solutions.    HPI Pt presents for amw and health mt exam with review of chronic medical problems   I have personally reviewed the Medicare Annual Wellness questionnaire and have noted 1. The patient's medical and social history 2. Their use of alcohol, tobacco or illicit drugs 3. Their current medications and supplements 4. The patient's functional ability including ADL's, fall risks, home safety risks and hearing or visual             impairment. 5. Diet and physical activities 6. Evidence for depression or mood disorders  The patients weight, height, BMI have been recorded in the chart and visual acuity is per eye clinic.  I have made referrals, counseling and provided education to the patient based review of the above and I have provided the pt with a written personalized care plan for preventive services. Reviewed and updated provider list, see scanned forms.  See scanned forms.  Routine anticipatory guidance given to patient.  See health maintenance. Colon cancer screening  Colonoscopy 11/15  Breast cancer screening  Mammogram 9/20 - repeat views are ok and has one scheduled for 07/21/20 Self breast exam- no lumps  Flu vaccine- gets in the fall  Tetanus vaccine  Postponed/financial Pneumovax -completed covid status -immunized in jan/feb Zoster vaccine-zostavax 1/15 Dexa 8/19 -osteopenia  Falls-none  Fractures-none  Supplements-taking vit D  Exercise - slow walking   Advance directive -has that utd  Cognitive function addressed- see scanned forms- and if abnormal then additional documentation  follows.  Normal aging changes- walks into a room and forgets why  Some things take longer/does her own finances     Care team  Shawnelle Spoerl-pcp [place] -gen surg [facility]- cardiology   PMH and SH reviewed  Meds, vitals, and allergies reviewed.   ROS: See HPI.  Otherwise negative.    Weight : Wt Readings from Last 3 Encounters:  06/30/20 218 lb 7 oz (99.1 kg)  01/19/20 212 lb 8 oz (96.4 kg)  05/26/19 197 lb 5 oz (89.5 kg)   42.66 kg/m She has put on weight -not as active and not watching diet  Stress level is high  Not motivated    Hearing/vision:  Hearing Screening   125Hz  250Hz  500Hz  1000Hz  2000Hz  3000Hz  4000Hz  6000Hz  8000Hz   Right ear:   40 40 40  40    Left ear:   40 40 40  40      Visual Acuity Screening   Right eye Left eye Both eyes  Without correction:     With correction: 20/30 20/30 20/30   keeps up with eye exams    HTN  bp is stable today  No cp or palpitations or headaches or edema  No side effects to medicines  BP Readings from Last 3 Encounters:  06/30/20 (!) 144/70  01/19/20 140/86  05/26/19 128/82     No highs elsewhere  Re check BP: 131/70    A fib  pacerone and xarelto  No changes -has not been bothered   Pulse Readings from Last 3 Encounters:  06/30/20 72  01/19/20 70  05/26/19 66  Past h/o hyperthyroid Lab Results  Component Value Date   TSH 1.54 06/28/2020    Cholesterol Lab Results  Component Value Date   CHOL 141 06/28/2020   CHOL 164 05/24/2019   CHOL 153 05/13/2018   Lab Results  Component Value Date   HDL 53.80 06/28/2020   HDL 44.60 05/24/2019   HDL 50.40 05/13/2018   Lab Results  Component Value Date   LDLCALC 67 06/28/2020   LDLCALC 80 05/24/2019   LDLCALC 77 05/13/2018   Lab Results  Component Value Date   TRIG 102.0 06/28/2020   TRIG 196.0 (H) 05/24/2019   TRIG 127.0 05/13/2018   Lab Results  Component Value Date   CHOLHDL 3 06/28/2020   CHOLHDL 4 05/24/2019   CHOLHDL 3 05/13/2018   No  results found for: LDLDIRECT Good genetics for cholesterol  Not eating the best    Prediabetes Lab Results  Component Value Date   HGBA1C 6.2 06/28/2020  up from 6.0  Loves carbs and sweets   Stress reaction  Still takes paxil   Anemia  Lab Results  Component Value Date   WBC 6.7 06/28/2020   HGB 11.9 (L) 06/28/2020   HCT 37.5 06/28/2020   MCV 80.4 06/28/2020   PLT 283.0 06/28/2020  no bleeding problems  No GI problems  Iron stores are low  Has ferrous sulfate- made her sick so she got mvi with iron    Patient Active Problem List   Diagnosis Date Noted  . Prediabetes 05/23/2019  . Parotiditis 11/27/2018  . Iron deficiency anemia 05/23/2018  . Screening mammogram, encounter for 05/20/2018  . (HFpEF) heart failure with preserved ejection fraction ([place]) 05/19/2017  . GERD (gastroesophageal reflux disease) 08/20/2016  . DOE (dyspnea on exertion) 05/28/2016  . Sleep disturbance 05/28/2016  . Somnolence, daytime 05/09/2016  . Encounter for anticoagulation discussion and counseling 02/15/2016  . Atrial fibrillation and flutter ([place]) 02/14/2016  . Routine general medical examination at a health care facility 10/13/2015  . Estrogen deficiency 10/13/2015  . Fatigue 08/25/2015  . History of melena 12/12/2014  . Gallstone 12/07/2014  . Encounter for routine gynecological examination 04/14/2013  . Encounter for Medicare annual wellness exam 03/30/2013  . STRESS REACTION, ACUTE, WITH EMOTIONAL DISTURBANCE 11/26/2010  . Osteopenia 06/21/2009  . Hyperthyroidism 04/26/2008  . Morbid obesity ([place]) 04/26/2008  . Essential hypertension 04/26/2008   Past Medical History:  Diagnosis Date  . Anemia    iron def  . Atrial fibrillation ([place])    Dr Rockey Situ  . Cataract 05/13/2018   both eyes per last vision exam   . Complication of anesthesia    "quit breathing" -Endoscopy( 12-22-14)  . Difficult intubation   . Fibrocystic breast   . Fracture of right shoulder   . Gallstones     . Grave's disease   . Graves disease   . History of kidney stones    not a problem at present  . Hypertension   . Obesity   . Osteoporosis    osteopenia   Past Surgical History:  Procedure Laterality Date  . COLONOSCOPY  09/29/2014   Normal, Delfin Edis, MD  . ELECTROPHYSIOLOGIC STUDY N/A 04/12/2016   Procedure: CARDIOVERSION;  Surgeon: Minna Merritts, MD;  Location: [facility] ORS;  Service: Cardiovascular;  Laterality: N/A;  . ESOPHAGOGASTRODUODENOSCOPY (EGD) WITH PROPOFOL N/A 09/08/2018   Procedure: ESOPHAGOGASTRODUODENOSCOPY (EGD) WITH PROPOFOL;  Surgeon: Lollie Sails, MD;  Location: [facility] ENDOSCOPY;  Service: Endoscopy;  Laterality: N/A;  . ESOPHAGOGASTRODUODENOSCOPY ENDOSCOPY    .  EUS N/A 01/12/2015   Procedure: UPPER ENDOSCOPIC ULTRASOUND (EUS) LINEAR;  Surgeon: Milus Banister, MD;  Location: [facility] ENDOSCOPY;  Service: Endoscopy;  Laterality: N/A;  . TUBAL LIGATION     Social History   Tobacco Use  . Smoking status: Never Smoker  . Smokeless tobacco: Never Used  Vaping Use  . Vaping Use: Never used  Substance Use Topics  . Alcohol use: No    Alcohol/week: 0.0 standard drinks  . Drug use: No   Family History  Problem Relation Age of Onset  . Hypertension Mother   . Diabetes Father   . Hypertension Father   . Hypertension Brother   . Cancer Brother        prostate  . Heart disease Brother        with bypass  . Colon cancer Neg Hx   . Breast cancer Neg Hx    No Known Allergies Current Outpatient Medications on File Prior to Visit  Medication Sig Dispense Refill  . acetaminophen (TYLENOL) 500 MG tablet Take 1,000 mg by mouth daily as needed.     Marland Kitchen amiodarone (PACERONE) 200 MG tablet TAKE 1 TABLET BY MOUTH EVERY DAY 30 tablet 5  . amLODipine (NORVASC) 5 MG tablet TAKE 1 TABLET BY MOUTH EVERY DAY 30 tablet 5  . Cholecalciferol (VITAMIN D3 PO) Take 400 Units by mouth daily.    . Cyanocobalamin (VITAMIN B 12 PO) Take by mouth daily.    . furosemide (LASIX) 20 MG  tablet Take 1 tablet (20 mg total) by mouth daily as needed. 30 tablet 3  . Multiple Vitamin (MULTIVITAMIN) capsule Take 1 capsule by mouth daily.    . rivaroxaban (XARELTO) 20 MG TABS tablet Take 1 tablet (20 mg total) by mouth daily with supper. 90 tablet 1   No current facility-administered medications on file prior to visit.    Review of Systems     Objective:   Physical Exam Constitutional:      General: She is not in acute distress.    Appearance: Normal appearance. She is well-developed. She is obese. She is not ill-appearing or diaphoretic.  HENT:     Head: Normocephalic and atraumatic.     Right Ear: Tympanic membrane, ear canal and external ear normal.     Left Ear: Tympanic membrane, ear canal and external ear normal.     Nose: Nose normal. No congestion.     Mouth/Throat:     Mouth: Mucous membranes are moist.     Pharynx: Oropharynx is clear. No posterior oropharyngeal erythema.  Eyes:     General: No scleral icterus.    Extraocular Movements: Extraocular movements intact.     Conjunctiva/sclera: Conjunctivae normal.     Pupils: Pupils are equal, round, and reactive to light.  Neck:     Thyroid: No thyromegaly.     Vascular: No carotid bruit or JVD.  Cardiovascular:     Rate and Rhythm: Normal rate and regular rhythm.     Pulses: Normal pulses.     Heart sounds: Normal heart sounds. No gallop.   Pulmonary:     Effort: Pulmonary effort is normal. No respiratory distress.     Breath sounds: Normal breath sounds. No wheezing.     Comments: Good air exch Chest:     Chest wall: No tenderness.  Abdominal:     General: Bowel sounds are normal. There is no distension or abdominal bruit.     Palpations: Abdomen is soft. There is no mass.  Tenderness: There is no abdominal tenderness.     Hernia: No hernia is present.  Genitourinary:    Comments: Breast exam: No mass, nodules, thickening, tenderness, bulging, retraction, inflamation, nipple discharge or skin  changes noted.  No axillary or clavicular LA.     Musculoskeletal:        General: No tenderness. Normal range of motion.     Cervical back: Normal range of motion and neck supple. No rigidity. No muscular tenderness.     Right lower leg: No edema.     Left lower leg: No edema.  Lymphadenopathy:     Cervical: No cervical adenopathy.  Skin:    General: Skin is warm and dry.     Coloration: Skin is not pale.     Findings: No erythema or rash.  Neurological:     Mental Status: She is alert. Mental status is at baseline.     Cranial Nerves: No cranial nerve deficit.     Motor: No abnormal muscle tone.     Coordination: Coordination normal.     Gait: Gait normal.     Deep Tendon Reflexes: Reflexes are normal and symmetric.  Psychiatric:        Mood and Affect: Mood normal.        Cognition and Memory: Cognition and memory normal.           Assessment & Plan:   Problem List Items Addressed This Visit      Cardiovascular and Mediastinum   Essential hypertension    bp in fair control at this time  BP Readings from Last 1 Encounters:  06/30/20 131/70   No changes needed Most recent labs reviewed  Disc lifstyle change with low sodium diet and exercise        Relevant Medications   fosinopril (MONOPRIL) 20 MG tablet   Atrial fibrillation and flutter ([place])    On pacerone (makes her tired) and anticoag  Continues cardiology visits       Relevant Medications   fosinopril (MONOPRIL) 20 MG tablet     Endocrine   Hyperthyroidism    TSH remains normal  No clinical changes  Lab Results  Component Value Date   TSH 1.54 06/28/2020           Musculoskeletal and Integument   Osteopenia    dexa ordered for 2 y f/u  No falls or fx Needs more exercise Taking mvi and D         Other   Morbid obesity ([place])    Discussed how this problem influences overall health and the risks it imposes  Reviewed plan for weight loss with lower calorie diet (via better food choices  and also portion control or program like weight watchers) and exercise building up to or more than 30 minutes 5 days per week including some aerobic activity         STRESS REACTION, ACUTE, WITH EMOTIONAL DISTURBANCE    Stressors remain high Stable with paxil  Some emotional eating and lack of self care      Encounter for Medicare annual wellness exam - Primary    Reviewed health habits including diet and exercise and skin cancer prevention Reviewed appropriate screening tests for age  Also reviewed health mt list, fam hx and immunization status , as well as social and family history   covid immunized Gets flu vaccine each fall  Mammogram is scheduled  Considering shingrix  dexa ordered -no falls or fractures Adv directive utd  No cognitive concerns  Nl vision/hearing screens  Wt loss enc       Routine general medical examination at a health care facility    Reviewed health habits including diet and exercise and skin cancer prevention Reviewed appropriate screening tests for age  Also reviewed health mt list, fam hx and immunization status , as well as social and family history   covid immunized Gets flu vaccine each fall  Mammogram is scheduled  Considering shingrix  dexa ordered -no falls or fractures Adv directive utd  No cognitive concerns  Nl vision/hearing screens  Wt loss enc      Estrogen deficiency   Relevant Orders   DG Bone Density   Iron deficiency anemia    Mild  Intol of ferrous sulfate from GI so taking mvi with iron       Prediabetes    Lab Results  Component Value Date   HGBA1C 6.2 06/28/2020   disc imp of low glycemic diet and wt loss to prevent DM2

## 2020-06-30 NOTE — Assessment & Plan Note (Signed)
Reviewed health habits including diet and exercise and skin cancer prevention Reviewed appropriate screening tests for age  Also reviewed health mt list, fam hx and immunization status , as well as social and family history   covid immunized Gets flu vaccine each fall  Mammogram is scheduled  Considering shingrix  dexa ordered -no falls or fractures Adv directive utd  No cognitive concerns  Nl vision/hearing screens  Wt loss enc

## 2020-06-30 NOTE — Assessment & Plan Note (Signed)
dexa ordered for 2 y f/u  No falls or fx Needs more exercise Taking mvi and D

## 2020-06-30 NOTE — Assessment & Plan Note (Signed)
bp in fair control at this time  BP Readings from Last 1 Encounters:  06/30/20 131/70   No changes needed Most recent labs reviewed  Disc lifstyle change with low sodium diet and exercise

## 2020-06-30 NOTE — Assessment & Plan Note (Signed)
Lab Results  Component Value Date   HGBA1C 6.2 06/28/2020   disc imp of low glycemic diet and wt loss to prevent DM2

## 2020-06-30 NOTE — Assessment & Plan Note (Signed)
Stressors remain high Stable with paxil  Some emotional eating and lack of self care

## 2020-06-30 NOTE — Assessment & Plan Note (Signed)
On pacerone (makes her tired) and [place] cardiology visits

## 2020-06-30 NOTE — Assessment & Plan Note (Signed)
Discussed how this problem influences overall health and the risks it imposes  Reviewed plan for weight loss with lower calorie diet (via better food choices and also portion control or program like weight watchers) and exercise building up to or more than 30 minutes 5 days per week including some aerobic activity    

## 2020-06-30 NOTE — Patient Instructions (Addendum)
Get your flu vaccine in the fall   If you are interested in the new shingles vaccine (Shingrix) - call your local pharmacy to check on coverage and availability  If affordable, get on a wait list at your pharmacy to get the vaccine.  Start thinking hard about diet  Try to get most of your carbohydrates from produce (with the exception of white potatoes)  Eat less bread/pasta/rice/snack foods/cereals/sweets and other items from the middle of the grocery store (processed carbs)   Get a multi vitamin with iron   Call to schedule your bone density test -I ordered it

## 2020-06-30 NOTE — Assessment & Plan Note (Signed)
TSH remains normal  No clinical changes  Lab Results  Component Value Date   TSH 1.54 06/28/2020

## 2020-07-19 ENCOUNTER — Other Ambulatory Visit: Payer: Self-pay | Admitting: Cardiovascular Disease

## 2020-07-20 ENCOUNTER — Other Ambulatory Visit: Payer: Self-pay | Admitting: Family Medicine

## 2020-07-21 ENCOUNTER — Other Ambulatory Visit: Payer: Self-pay

## 2020-07-21 ENCOUNTER — Ambulatory Visit
Admission: RE | Admit: 2020-07-21 | Discharge: 2020-07-21 | Disposition: A | Discharge status: Home / Self Care | Payer: Medicare HMO | Source: Ambulatory Visit | Attending: Family Medicine | Admitting: Family Medicine

## 2020-07-21 DIAGNOSIS — Z1231 Encounter for screening mammogram for malignant neoplasm of breast: Secondary | ICD-10-CM | POA: Diagnosis not present

## 2020-07-26 ENCOUNTER — Other Ambulatory Visit: Payer: Self-pay | Admitting: Cardiovascular Disease

## 2020-07-26 NOTE — Telephone Encounter (Signed)
Refill Request.  

## 2020-07-26 NOTE — Telephone Encounter (Signed)
Pt's age 74, wt 99.1 kg, SCr 1.06, CrCl 71.74, last ov w/ TG 01/19/20. °

## 2020-08-05 ENCOUNTER — Other Ambulatory Visit: Payer: Self-pay | Admitting: Family Medicine

## 2020-08-24 DIAGNOSIS — R69 Illness, unspecified: Secondary | ICD-10-CM | POA: Diagnosis not present

## 2020-08-31 DIAGNOSIS — Z0100 Encounter for examination of eyes and vision without abnormal findings: Secondary | ICD-10-CM | POA: Diagnosis not present

## 2020-08-31 DIAGNOSIS — H524 Presbyopia: Secondary | ICD-10-CM | POA: Diagnosis not present

## 2020-09-05 ENCOUNTER — Other Ambulatory Visit: Payer: Self-pay | Admitting: Cardiovascular Disease

## 2020-10-11 ENCOUNTER — Ambulatory Visit: Payer: Medicare HMO | Admitting: Family Medicine

## 2021-01-20 ENCOUNTER — Other Ambulatory Visit: Payer: Self-pay | Admitting: Cardiovascular Disease

## 2021-01-22 NOTE — Telephone Encounter (Signed)
Pt's age 75, wt 99.1 kg, SCr 1.06, CrCl 71.74, last ov w/ TG 01/19/20.

## 2021-01-22 NOTE — Telephone Encounter (Signed)
Refill Request.  

## 2021-01-23 ENCOUNTER — Ambulatory Visit: Payer: Medicare HMO | Admitting: Cardiovascular Disease

## 2021-01-30 IMAGING — MG DIGITAL SCREENING BILAT W/ TOMO W/ CAD
6 of 10 series · 6 of 30 positions shown · non-contrast
Comparison: Previous exam(s).

CLINICAL DATA: Screening.

EXAM:
DIGITAL SCREENING BILATERAL MAMMOGRAM WITH TOMO AND CAD

[L MLO synth-2D (1 of 2)]
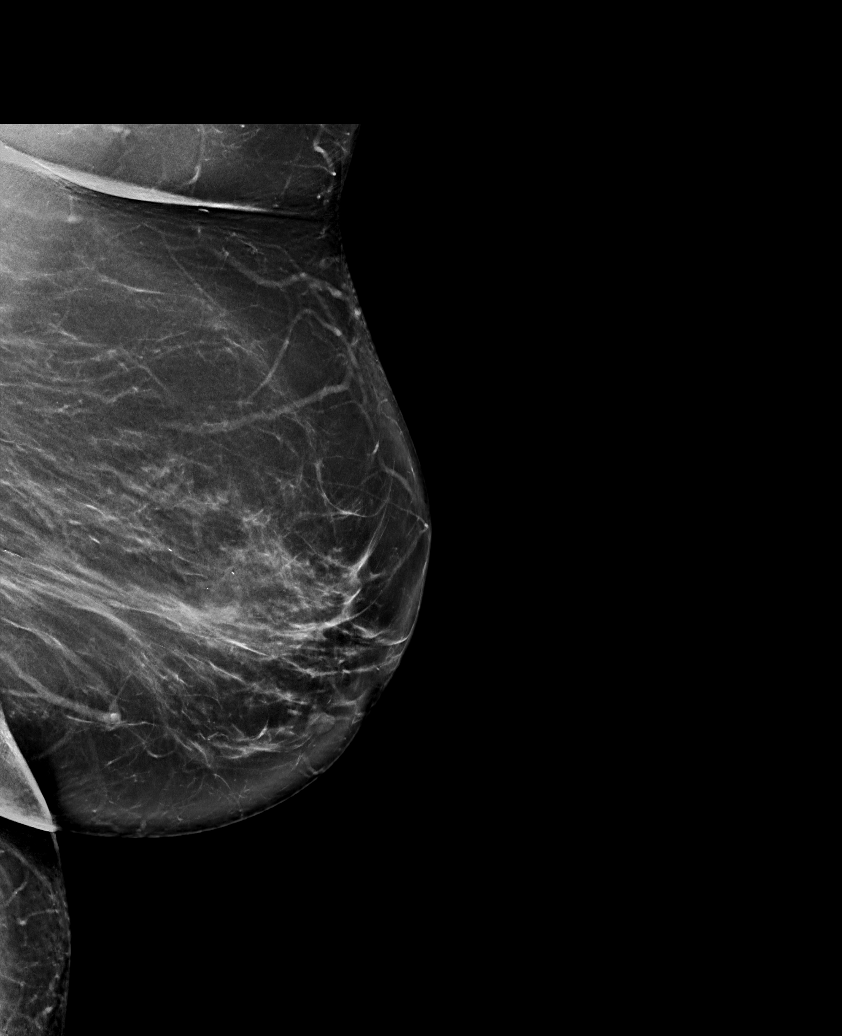

[L CC synth-2D]
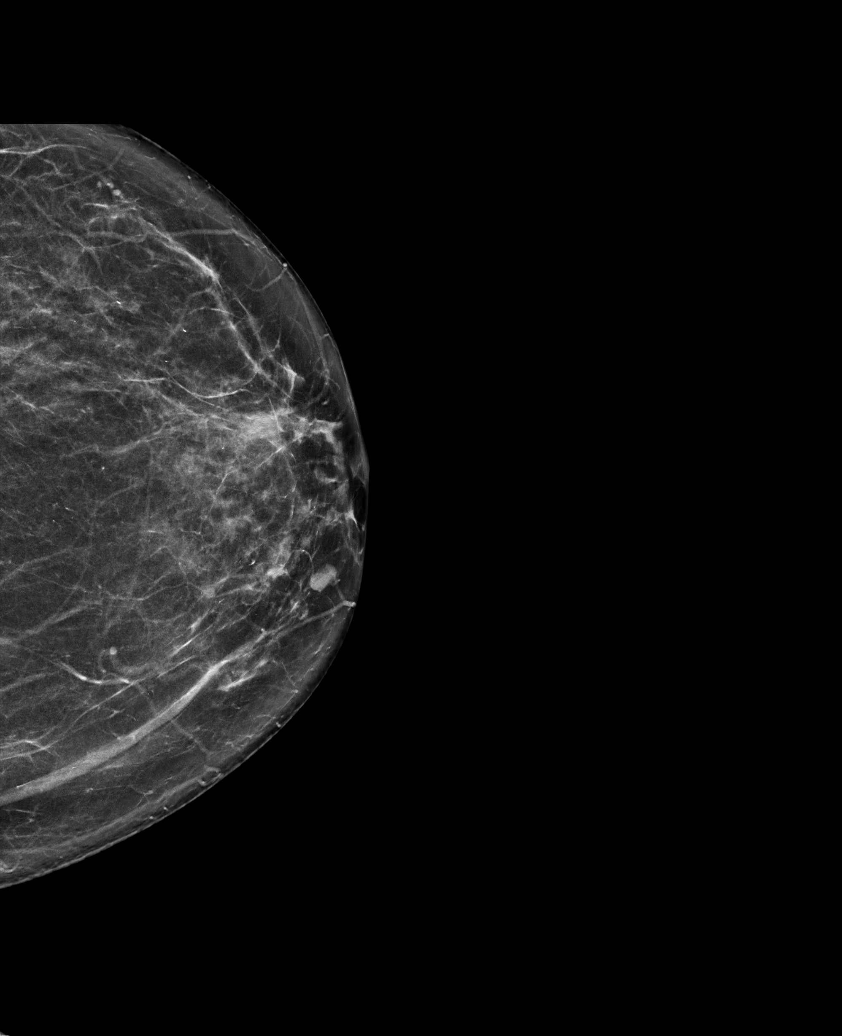

[L MLO synth-2D (2 of 2)]
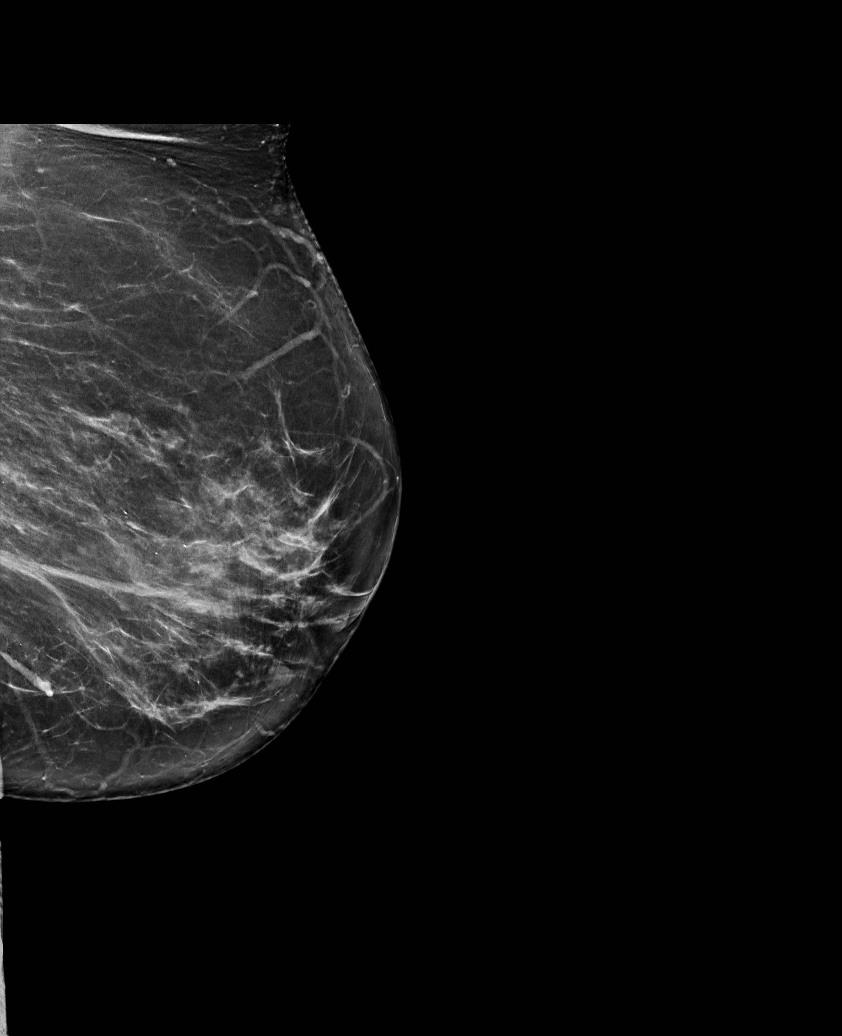

[R CC synth-2D]
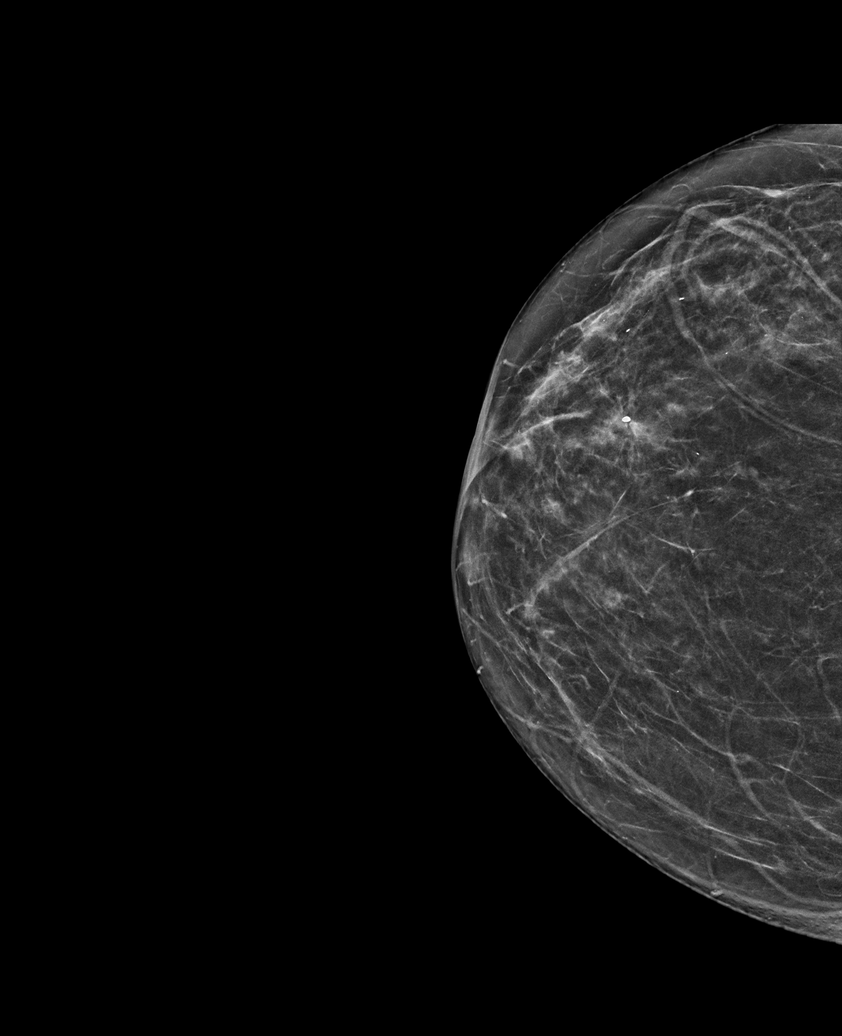

[R MLO synth-2D]
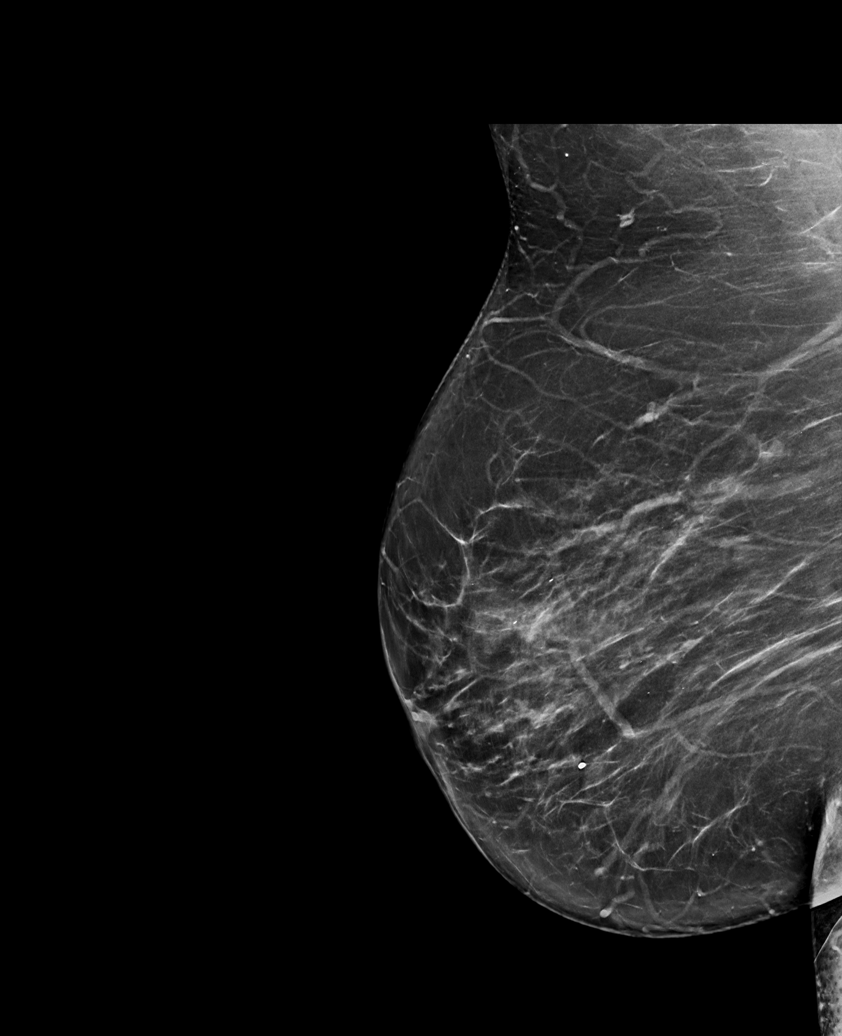

[R MLO tomo · tomo slice 42/83.0]
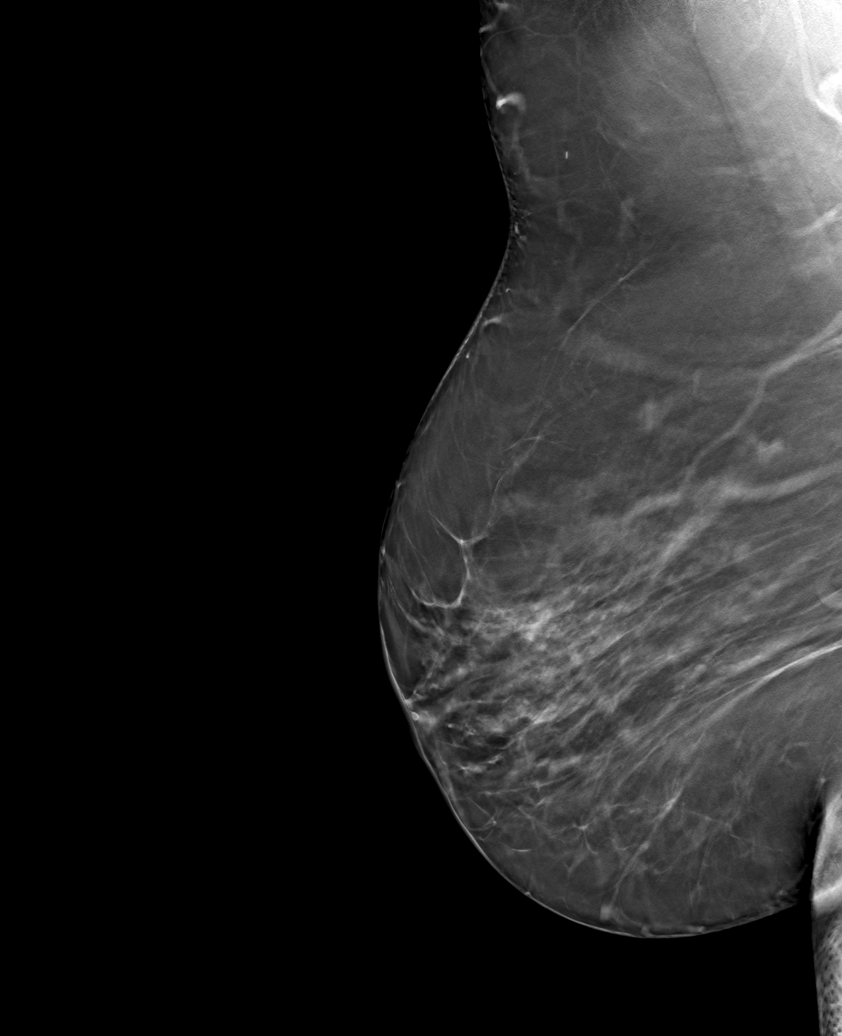

[6 of 30 positions shown; findings below may reference images not displayed]

ACR Breast Density Category c: The breast tissue is heterogeneously
dense, which may obscure small masses.
FINDINGS: There are no findings suspicious for malignancy. Images were
processed with CAD.
IMPRESSION: No mammographic evidence of malignancy. A result letter of this
screening mammogram will be mailed directly to the patient.

RECOMMENDATION:
Screening mammogram in one year. (Code:[id number]-[id number])

BI-RADS CATEGORY  1: Negative.

## 2021-03-03 ENCOUNTER — Other Ambulatory Visit: Payer: Self-pay | Admitting: Cardiovascular Disease

## 2021-03-05 NOTE — Progress Notes (Signed)
Cardiology Office Note  Date:  03/06/2021   ID:  Kerry Simpson, DOB [birth year], MRN [id number]  PCP:  Abner Greenspan, MD   Chief Complaint  Patient presents with  . 12 month follow up     Patient c/o LE edema in the late evenings before bedtime. Medications reviewed by the patient verbally.     HPI:  [name] is a pleasant 75 year old woman with history of  obesity,  hypertension,  GERD  who presents for routine follow-up for persistent atrial fibrillation  Husband retired  Eating less, Weight down 11 pounds, over the past year Sedentary, no regular exercise program Denies significant tachycardia  Rare use of Lasix for leg swelling  Labs reviewed with her in detail HBA1C 6.2 totaL CHOL 141,  Ldl 67 HCT 40  EKG personally reviewed by myself on todays visit shows normal sinus rhythm with rate 69 bpm, left axis deviation  Other past medical history reviewed Echo 02/2016: EF 60%, RVSp 38  Started on amio 03/2016: CHADS-VASc score is greater than or equal to 3 (hypertension, age, gender)  Seen by dr. [name] 04/2016:  Tachy/brady digoxin held sleep disordered breathing and evidence of HFpEF.  Does not think she has OSA Cancelled sleep study Previous cardioversion cancelled, she converted on her own on amio  Echocardiogram  showing normal LV function, mildly elevated right heart pressures   PMH:   has a past medical history of Anemia, Atrial fibrillation ([place]), Cataract (08/27/5101), Complication of anesthesia, Difficult intubation, Fibrocystic breast, Fracture of right shoulder, Gallstones, Grave's disease, Graves disease, History of kidney stones, Hypertension, Obesity, and Osteoporosis.  PSH:    Past Surgical History:  Procedure Laterality Date  . COLONOSCOPY  09/29/2014   Normal, Delfin Edis, MD  . ELECTROPHYSIOLOGIC STUDY N/A 04/12/2016   Procedure: CARDIOVERSION;  Surgeon: Minna Merritts, MD;  Location: [facility] ORS;  Service: Cardiovascular;   Laterality: N/A;  . ESOPHAGOGASTRODUODENOSCOPY (EGD) WITH PROPOFOL N/A 09/08/2018   Procedure: ESOPHAGOGASTRODUODENOSCOPY (EGD) WITH PROPOFOL;  Surgeon: Lollie Sails, MD;  Location: [facility] ENDOSCOPY;  Service: Endoscopy;  Laterality: N/A;  . ESOPHAGOGASTRODUODENOSCOPY ENDOSCOPY    . EUS N/A 01/12/2015   Procedure: UPPER ENDOSCOPIC ULTRASOUND (EUS) LINEAR;  Surgeon: Milus Banister, MD;  Location: [facility] ENDOSCOPY;  Service: Endoscopy;  Laterality: N/A;  . TUBAL LIGATION      Current Outpatient Medications  Medication Sig Dispense Refill  . acetaminophen (TYLENOL) 500 MG tablet Take 1,000 mg by mouth daily as needed.     Marland Kitchen amiodarone (PACERONE) 200 MG tablet TAKE 1 TABLET BY MOUTH ONCE A DAY 30 tablet 0  . amLODipine (NORVASC) 5 MG tablet TAKE 1 TABLET BY MOUTH ONCE A DAY 30 tablet 6  . Cholecalciferol (VITAMIN D3 PO) Take 400 Units by mouth daily.    . Cyanocobalamin (VITAMIN B 12 PO) Take by mouth daily.    . fosinopril (MONOPRIL) 20 MG tablet Take 1 tablet (20 mg total) by mouth daily. 90 tablet 3  . furosemide (LASIX) 20 MG tablet Take 1 tablet (20 mg total) by mouth daily as needed. 30 tablet 3  . Multiple Vitamin (MULTIVITAMIN) capsule Take 1 capsule by mouth daily.    Marland Kitchen omeprazole (PRILOSEC) 20 MG capsule Take 1 capsule (20 mg total) by mouth daily. 90 capsule 3  . PARoxetine (PAXIL) 10 MG tablet Take 1 tablet (10 mg total) by mouth daily. 90 tablet 3  . XARELTO 20 MG TABS tablet TAKE 1 TABLET BY MOUTH ONCE A DAY WITH  SUPPER 90 tablet 1   No current facility-administered medications for this visit.     Allergies:   Patient has no known allergies.   Social History:  The patient  reports that she has never smoked. She has never used smokeless tobacco. She reports that she does not drink alcohol and does not use drugs.   Family History:   family history includes Cancer in her brother; Diabetes in her father; Heart disease in her brother; Hypertension in her brother, father, and  mother.    Review of Systems: Review of Systems  Constitutional: Negative.   HENT: Negative.   Respiratory: Negative.   Cardiovascular: Negative.   Gastrointestinal: Negative.   Musculoskeletal: Negative.   Neurological: Negative.   Psychiatric/Behavioral: Negative.   All other systems reviewed and are negative.   PHYSICAL EXAM: VS:  BP 110/70 (BP Location: Left Arm, Patient Position: Sitting, Cuff Size: Large)   Pulse 75   Ht 5' (1.524 m)   Wt 207 lb 8 oz (94.1 kg)   SpO2 96%   BMI 40.52 kg/m  , BMI Body mass index is 40.52 kg/m. Constitutional:  oriented to person, place, and time. No distress.  HENT:  Head: Grossly normal Eyes:  no discharge. No scleral icterus.  Neck: No JVD, no carotid bruits  Cardiovascular: Regular rate and rhythm, no murmurs appreciated Pulmonary/Chest: Clear to auscultation bilaterally, no wheezes or rales Abdominal: Soft.  no distension.  no tenderness.  Musculoskeletal: Normal range of motion Neurological:  normal muscle tone. Coordination normal. No atrophy Skin: Skin warm and dry Psychiatric: normal affect, pleasant   Recent Labs: 06/28/2020: ALT 17; BUN 12; Creatinine, Ser 1.06; Hemoglobin 11.9; Platelets 283.0; Potassium 4.3; Sodium 140; TSH 1.54    Lipid Panel Lab Results  Component Value Date   CHOL 141 06/28/2020   HDL 53.80 06/28/2020   LDLCALC 67 06/28/2020   TRIG 102.0 06/28/2020      Wt Readings from Last 3 Encounters:  03/06/21 207 lb 8 oz (94.1 kg)  06/30/20 218 lb 7 oz (99.1 kg)  01/19/20 212 lb 8 oz (96.4 kg)     ASSESSMENT AND PLAN:  Essential hypertension - Plan: EKG 12-Lead Low pressure With leg swelling possibly exacerbated by amlodipine Could hold amlodipine 5, and monitor blood pressure, discussed   Atrial fibrillation and flutter ([place]) - Plan: EKG 12-Lead On anticoagulation, rate control with amiodarone Sick sinus syndrome, not on beta-blocker In NSR  Class 2 obesity due to excess calories without  serious comorbidity with body mass index (BMI) of 37.0 to 37.9 in adult We have encouraged continued exercise, careful diet management in an effort to lose weight.  DOE (dyspnea on exertion) Secondary to obesity, deconditioning Suggested walking  Chest pain, unspecified type Denies any episodes, no further work-up at this time No further testing   Sleep disturbance Reports sleeping relatively well Stressed importance of walking program, weight loss   Total encounter time more than 25 minutes  Greater than 50% was spent in counseling and coordination of care with the patient   No orders of the defined types were placed in this encounter.    Signed, Esmond Plants, M.D., Ph.D. 03/06/2021  [place], [place]

## 2021-03-06 ENCOUNTER — Ambulatory Visit: Payer: Medicare HMO | Admitting: Cardiovascular Disease

## 2021-03-06 ENCOUNTER — Encounter: Payer: Self-pay | Admitting: Cardiovascular Disease

## 2021-03-06 ENCOUNTER — Other Ambulatory Visit: Payer: Self-pay

## 2021-03-06 VITALS — BP 110/70 | HR 75 | Ht 60.0 in | Wt 207.5 lb

## 2021-03-06 DIAGNOSIS — I4892 Unspecified atrial flutter: Secondary | ICD-10-CM | POA: Diagnosis not present

## 2021-03-06 DIAGNOSIS — I5030 Unspecified diastolic (congestive) heart failure: Secondary | ICD-10-CM | POA: Diagnosis not present

## 2021-03-06 DIAGNOSIS — I10 Essential (primary) hypertension: Secondary | ICD-10-CM | POA: Diagnosis not present

## 2021-03-06 DIAGNOSIS — I4891 Unspecified atrial fibrillation: Secondary | ICD-10-CM

## 2021-03-06 MED ORDER — AMIODARONE HCL 200 MG PO TABS
200.0000 mg | ORAL_TABLET | Freq: Every day | ORAL | 3 refills | Status: DC
Start: 2021-03-06 — End: 2022-04-01

## 2021-03-06 MED ORDER — FUROSEMIDE 20 MG PO TABS
20.0000 mg | ORAL_TABLET | Freq: Every day | ORAL | 3 refills | Status: DC | PRN
Start: 2021-03-06 — End: 2022-10-10

## 2021-03-06 NOTE — Patient Instructions (Signed)
Medication Instructions:  No changes  For swelling,  Take lasix couple days in a row If no better, Hold the amlodipine, this can cause swelling    If you need a refill on your cardiac medications before your next appointment, please call your pharmacy.    Lab work: No new labs needed   If you have labs (blood work) drawn today and your tests are completely normal, you will receive your results only by: Marland Kitchen MyChart Message (if you have MyChart) OR . A paper copy in the mail If you have any lab test that is abnormal or we need to change your treatment, we will call you to review the results.   Testing/Procedures: No new testing needed   Follow-Up: At [facility], you and your health needs are our priority.  As part of our continuing mission to provide you with exceptional heart care, we have created designated Provider Care Teams.  These Care Teams include your primary Cardiologist (physician) and Advanced Practice Providers (APPs -  Physician Assistants and Nurse Practitioners) who all work together to provide you with the care you need, when you need it.  . You will need a follow up appointment in 12 months  . Providers on your designated Care Team:   . Murray Hodgkins, NP . Christell Faith, PA-C . Marrianne Mood, PA-C  Any Other Special Instructions Will Be Listed Below (If Applicable).  COVID-19 Vaccine Information can be found at: [email or web address].uk For questions related to vaccine distribution or appointments, please email [email or web address] or call [phone].

## 2021-03-17 ENCOUNTER — Other Ambulatory Visit: Payer: Self-pay | Admitting: Cardiovascular Disease

## 2021-04-13 ENCOUNTER — Other Ambulatory Visit: Payer: Self-pay | Admitting: Cardiovascular Disease

## 2021-06-19 ENCOUNTER — Telehealth: Payer: Self-pay

## 2021-06-19 NOTE — Telephone Encounter (Signed)
[facility] - Client TELEPHONE ADVICE RECORD [facility] Patient Name: Kerry Simpson Gender: Female DOB: 1946-11-09 Age: 75 Y 64 M Return Phone Number: [phone] (Primary) Address: City/ State/ Zip: [place] [address] Client [facility] - Client Client Site [place], Roque Lias - MD Contact Type Call Who Is Calling Patient / Member / Family / Caregiver Call Type Triage / Clinical Relationship To Patient Self Return Phone Number [phone] (Primary) Chief Complaint Walking difficulty Reason for Call Symptomatic / Request for [place] states her left knee is swollen and she is having trouble walking even with a cane. There are no appointments for today or tomorrow. Translation No Nurse Assessment Nurse: Clovis Riley, RN, Georgina Peer Date/Time (Eastern Time): 06/19/2021 11:47:18 AM Confirm and document reason for call. If symptomatic, describe symptoms. ---Caller states her left knee is swollen in the back of the knee that started yesterday afternoon and she is having trouble walking even with a cane because of the pain. Does the patient have any new or worsening symptoms? ---Yes Will a triage be completed? ---Yes Related visit to physician within the last 2 weeks? ---No Does the PT have any chronic conditions? (i.e. diabetes, asthma, this includes High risk factors for pregnancy, etc.) ---No Is this a behavioral health or substance abuse call? ---No Guidelines Guideline Title Affirmed Question Affirmed Notes Nurse Date/Time Eilene Ghazi Time) Knee Pain [1] SEVERE pain (e.g., excruciating, unable to walk) AND [2] not improved after 2 hours of pain medicine Clovis Riley, RN, Georgina Peer 06/19/2021 11:48:54 AM Disp. Time Eilene Ghazi Time) Disposition Final User PLEASE NOTE: All timestamps contained within this report are represented as [place] Standard  Time. CONFIDENTIALTY NOTICE: This fax transmission is intended only for the addressee. It contains information that is legally privileged, confidential or otherwise protected from use or disclosure. If you are not the intended recipient, you are strictly prohibited from reviewing, disclosing, copying using or disseminating any of this information or taking any action in reliance on or regarding this information. If you have received this fax in error, please notify us immediately by telephone so that we can arrange for its return to [place]. Phone: [phone], Toll-Free: [phone], Fax: [phone] Page: 2 of 2 Call Id: [id number] 06/19/2021 11:51:53 AM See HCP within 4 Hours (or PCP triage) Yes Clovis Riley, RN, Leilani Merl Disagree/Comply Comply Caller Understands Yes PreDisposition Call Doctor Care Advice Given Per Guideline SEE HCP (OR PCP TRIAGE) WITHIN 4 HOURS: * IF OFFICE WILL BE OPEN: You need to be seen within the next 3 or 4 hours. Call your doctor (or NP/PA) now or as soon as the office opens. PAIN MEDICINES: * For pain relief, you can take either acetaminophen, ibuprofen, or naproxen. * They are over-the-counter (OTC) pain drugs. You can buy them at the drugstore. * ACETAMINOPHEN - REGULAR STRENGTH TYLENOL: Take 650 mg (two 325 mg pills) by mouth every 4 to 6 hours as needed. Each Regular Strength Tylenol pill has 325 mg of acetaminophen. The most you should take each day is 3,250 mg (10 pills a day). * NAPROXEN (E.G., ALEVE): Take 220 mg (one 220 mg pill) by mouth every 8 to 12 hours as needed. You may take 440 mg (two 220 mg pills) for your first dose. The most you should take each day is 660 mg (three 220 mg pills a day), unless your doctor has told you to take more. CALL BACK IF: * You become  worse CARE ADVICE given per Knee Pain (Adult) guideline. Comments User: Arman Bogus, RN Date/Time Eilene Ghazi Time): 06/19/2021 11:56:16 AM pt states she will try ice and aleve and if not better  in the morning she will go to [facility] Referrals [place] REFUSED

## 2021-06-19 NOTE — Telephone Encounter (Signed)
Please get her in with first available this week. If none and no improvement, urgent  (also if symptoms worsen) I recommend ice/elevation and wrap if needed

## 2021-06-19 NOTE — Telephone Encounter (Signed)
Left VM requesting pt to call the office back 

## 2021-06-20 NOTE — Telephone Encounter (Signed)
Mrs. Diogo called in returning shapale phone call. She stated that when she spoke with the nurse yesterday they told her what to do and she is feeling 75% better

## 2021-06-20 NOTE — Telephone Encounter (Signed)
Excellent! Thanks for the heads up.  Continue elevation and ice and f/u if needed

## 2021-06-24 DIAGNOSIS — M25562 Pain in left knee: Secondary | ICD-10-CM | POA: Diagnosis not present

## 2021-06-28 ENCOUNTER — Other Ambulatory Visit: Payer: Self-pay | Admitting: Physician Assistant

## 2021-06-28 ENCOUNTER — Ambulatory Visit
Admission: RE | Admit: 2021-06-28 | Discharge: 2021-06-28 | Disposition: A | Discharge status: Home / Self Care | Payer: Medicare HMO | Source: Ambulatory Visit | Attending: Physician Assistant | Admitting: Physician Assistant

## 2021-06-28 ENCOUNTER — Other Ambulatory Visit: Payer: Self-pay

## 2021-06-28 DIAGNOSIS — R2240 Localized swelling, mass and lump, unspecified lower limb: Secondary | ICD-10-CM

## 2021-06-28 DIAGNOSIS — R600 Localized edema: Secondary | ICD-10-CM | POA: Diagnosis not present

## 2021-06-28 DIAGNOSIS — M7989 Other specified soft tissue disorders: Secondary | ICD-10-CM | POA: Diagnosis not present

## 2021-06-28 DIAGNOSIS — M79662 Pain in left lower leg: Secondary | ICD-10-CM | POA: Diagnosis not present

## 2021-07-02 DIAGNOSIS — R2240 Localized swelling, mass and lump, unspecified lower limb: Secondary | ICD-10-CM | POA: Diagnosis not present

## 2021-07-03 ENCOUNTER — Encounter: Payer: Self-pay | Admitting: Family Medicine

## 2021-07-04 ENCOUNTER — Ambulatory Visit (INDEPENDENT_AMBULATORY_CARE_PROVIDER_SITE_OTHER): Payer: Medicare HMO | Admitting: Family Medicine

## 2021-07-04 ENCOUNTER — Encounter: Payer: Self-pay | Admitting: Family Medicine

## 2021-07-04 ENCOUNTER — Other Ambulatory Visit: Payer: Self-pay

## 2021-07-04 VITALS — BP 124/62 | HR 82 | Temp 98.0°F | Ht 60.0 in | Wt 202.2 lb

## 2021-07-04 DIAGNOSIS — R4589 Other symptoms and signs involving emotional state: Secondary | ICD-10-CM | POA: Diagnosis not present

## 2021-07-04 DIAGNOSIS — M25562 Pain in left knee: Secondary | ICD-10-CM

## 2021-07-04 DIAGNOSIS — R69 Illness, unspecified: Secondary | ICD-10-CM | POA: Diagnosis not present

## 2021-07-04 MED ORDER — PAROXETINE HCL 20 MG PO TABS: 20.0000 mg | ORAL_TABLET | Freq: Every day | ORAL | 3 refills | Status: DC

## 2021-07-04 NOTE — Assessment & Plan Note (Signed)
Worse lately with orthopedic issues for she and husband  Will inc paxil to 20 mg (this has worked in the past)  inst to update if no improvement or any side effects   Discussed expectations of SSRI medication including time to effectiveness and mechanism of action, also poss of side effects (early and late)- including mental fuzziness, weight or appetite change, nausea and poss of worse dep or anxiety (even suicidal thoughts)  Pt voiced understanding and will stop med and update if this occurs

## 2021-07-04 NOTE — Patient Instructions (Signed)
Let's go up on paxil to 20 mg daily  If not helpful let us know   Take care of yourself   Follow up with orthopedics as planned   I wonder if you may have a lipoma (fatty lump) on the side of your knee  Please discuss with Dr Sabra Heck  Perhaps to consider surgical opinion or more imaging

## 2021-07-04 NOTE — Progress Notes (Signed)
Subjective:    Patient ID: Kerry Simpson, female    DOB: [birth year], 75 y.o.   MRN: [id number]  This visit occurred during the SARS-CoV-2 public health emergency.  Safety protocols were in place, including screening questions prior to the visit, additional usage of staff PPE, and extensive cleaning of exam room while observing appropriate contact time as indicated for disinfecting solutions.   HPI Pt presents for c/o leg swelling  Also needs refill of paxil   Wt Readings from Last 3 Encounters:  07/04/21 202 lb 4 oz (91.7 kg)  03/06/21 207 lb 8 oz (94.1 kg)  06/30/20 218 lb 7 oz (99.1 kg)   39.50 kg/m  Trouble with leg around L knee  On/off pain  [facility] 3 visits and Dr Sabra Heck though possible cellulitis and tx for that  Also [place] to r/o DVT (normal)  Elevating and compression and ice  Keflex 500 Q 6 h  (making her a little nauseated) Finished bactrim ds bid for 7d   Last visit there was 8/22 Dr Darr Reviewed note today  ReadI MPRESSION: No femoropopliteal DVT nor evidence of DVT within the visualized calf veins.   If clinical symptoms are inconsistent or if there are persistent or worsening symptoms, further imaging (possibly involving the iliac veins) may be warranted.ing for [place]:   She brings xray pictures with her from [place] today Noted no fx or dislocation, anterior osteophyte at patella and mild narrowing of the patellofemoral joing Mild Medial joint space narrowing bilat worse on L  Today- swelling is improved  Still some discomfort medially  Occ groin area bothers her  Using ice and compression  Not very red/ was warm   Has a cane in case she needs it  Has re check Monday   Patient Active Problem List   Diagnosis Date Noted   Left knee pain 07/04/2021   Prediabetes 05/23/2019   Parotiditis 11/27/2018   Iron deficiency anemia 05/23/2018   Screening mammogram, encounter for 05/20/2018   (HFpEF) heart failure with preserved ejection  fraction ([place]) 05/19/2017   GERD (gastroesophageal reflux disease) 08/20/2016   DOE (dyspnea on exertion) 05/28/2016   Sleep disturbance 05/28/2016   Somnolence, daytime 05/09/2016   Encounter for anticoagulation discussion and counseling 02/15/2016   Atrial fibrillation and flutter ([place]) 02/14/2016   Routine general medical examination at a health care facility 10/13/2015   Estrogen deficiency 10/13/2015   Fatigue 08/25/2015   History of melena 12/12/2014   Gallstone 12/07/2014   Encounter for routine gynecological examination 04/14/2013   Encounter for Medicare annual wellness exam 03/30/2013   STRESS REACTION, ACUTE, WITH EMOTIONAL DISTURBANCE 11/26/2010   Osteopenia 06/21/2009   Hyperthyroidism 04/26/2008   Morbid obesity ([place]) 04/26/2008   Essential hypertension 04/26/2008   Past Medical History:  Diagnosis Date   Anemia    iron def   Atrial fibrillation ([place])    Dr Rockey Situ   Cataract 05/13/2018   both eyes per last vision exam    Complication of anesthesia    "quit breathing" -Endoscopy( 12-22-14)   Difficult intubation    Fibrocystic breast    Fracture of right shoulder    Gallstones    Grave's disease    Graves disease    History of kidney stones    not a problem at present   Hypertension    Obesity    Osteoporosis    osteopenia   Past Surgical History:  Procedure Laterality Date   COLONOSCOPY  09/29/2014  Normal, Delfin Edis, MD   ELECTROPHYSIOLOGIC STUDY N/A 04/12/2016   Procedure: CARDIOVERSION;  Surgeon: Minna Merritts, MD;  Location: [facility] ORS;  Service: Cardiovascular;  Laterality: N/A;   ESOPHAGOGASTRODUODENOSCOPY (EGD) WITH PROPOFOL N/A 09/08/2018   Procedure: ESOPHAGOGASTRODUODENOSCOPY (EGD) WITH PROPOFOL;  Surgeon: Lollie Sails, MD;  Location: [facility] ENDOSCOPY;  Service: Endoscopy;  Laterality: N/A;   ESOPHAGOGASTRODUODENOSCOPY ENDOSCOPY     EUS N/A 01/12/2015   Procedure: UPPER ENDOSCOPIC ULTRASOUND (EUS) LINEAR;  Surgeon: Milus Banister, MD;   Location: [facility] ENDOSCOPY;  Service: Endoscopy;  Laterality: N/A;   TUBAL LIGATION     Social History   Tobacco Use   Smoking status: Never   Smokeless tobacco: Never  Vaping Use   Vaping Use: Never used  Substance Use Topics   Alcohol use: No    Alcohol/week: 0.0 standard drinks   Drug use: No   Family History  Problem Relation Age of Onset   Hypertension Mother    Diabetes Father    Hypertension Father    Hypertension Brother    Cancer Brother        prostate   Heart disease Brother        with bypass   Colon cancer Neg Hx    Breast cancer Neg Hx    No Known Allergies Current Outpatient Medications on File Prior to Visit  Medication Sig Dispense Refill   acetaminophen (TYLENOL) 500 MG tablet Take 1,000 mg by mouth daily as needed.      amiodarone (PACERONE) 200 MG tablet Take 1 tablet (200 mg total) by mouth daily. 90 tablet 3   amLODipine (NORVASC) 5 MG tablet TAKE 1 TABLET BY MOUTH ONCE A DAY 30 tablet 6   cephALEXin (KEFLEX) 500 MG capsule Take 1 capsule by mouth every 6 (six) hours.     Cholecalciferol (VITAMIN D3 PO) Take 400 Units by mouth daily.     Cyanocobalamin (VITAMIN B 12 PO) Take by mouth daily.     fosinopril (MONOPRIL) 20 MG tablet Take 1 tablet (20 mg total) by mouth daily. 90 tablet 3   furosemide (LASIX) 20 MG tablet Take 1 tablet (20 mg total) by mouth daily as needed. 90 tablet 3   Multiple Vitamin (MULTIVITAMIN) capsule Take 1 capsule by mouth daily.     omeprazole (PRILOSEC) 20 MG capsule Take 1 capsule (20 mg total) by mouth daily. 90 capsule 3   XARELTO 20 MG TABS tablet TAKE 1 TABLET BY MOUTH ONCE A DAY WITH SUPPER 90 tablet 1   No current facility-administered medications on file prior to visit.     Review of Systems  Constitutional:  Negative for activity change, appetite change, fatigue, fever and unexpected weight change.  HENT:  Negative for congestion, ear pain, rhinorrhea, sinus pressure and sore throat.   Eyes:  Negative for pain,  redness and visual disturbance.  Respiratory:  Negative for cough, shortness of breath and wheezing.   Cardiovascular:  Positive for leg swelling. Negative for chest pain and palpitations.  Gastrointestinal:  Negative for abdominal pain, blood in stool, constipation and diarrhea.  Endocrine: Negative for polydipsia and polyuria.  Genitourinary:  Negative for dysuria, frequency and urgency.  Musculoskeletal:  Positive for arthralgias. Negative for back pain and myalgias.  Skin:  Negative for pallor and rash.  Allergic/Immunologic: Negative for environmental allergies.  Neurological:  Negative for dizziness, syncope and headaches.  Hematological:  Negative for adenopathy. Does not bruise/bleed easily.  Psychiatric/Behavioral:  Negative for decreased concentration and dysphoric mood.  The patient is nervous/anxious.       Objective:   Physical Exam Constitutional:      General: She is not in acute distress.    Appearance: Normal appearance. She is obese. She is not ill-appearing or diaphoretic.  Eyes:     General: No scleral icterus.    Conjunctiva/sclera: Conjunctivae normal.     Pupils: Pupils are equal, round, and reactive to light.  Cardiovascular:     Rate and Rhythm: Normal rate and regular rhythm.  Pulmonary:     Effort: Pulmonary effort is normal. No respiratory distress.     Breath sounds: Normal breath sounds. No wheezing or rales.  Musculoskeletal:     Cervical back: Normal range of motion and neck supple. No tenderness.     Comments: Medial joint line tenderness of L knee with some soft tissue  swelling  Limited flexion, nl extension  Skin appears normal  Circumscribed rubbery mass noted on medial/posterior knee it is mobile and non tender (not fluctuant) Resembles lipoma   Lymphadenopathy:     Cervical: No cervical adenopathy.  Skin:    General: Skin is warm and dry.     Coloration: Skin is not pale.     Findings: No bruising, erythema or rash.     Comments: No rash  or erythema or warmth surrounding knee  Neurological:     Mental Status: She is alert.     Sensory: No sensory deficit.     Motor: No weakness.  Psychiatric:        Mood and Affect: Mood normal.          Assessment & Plan:   Problem List Items Addressed This Visit       Other   STRESS REACTION, ACUTE, WITH EMOTIONAL DISTURBANCE    Worse lately with orthopedic issues for she and husband  Will inc paxil to 20 mg (this has worked in the past)  inst to update if no improvement or any side effects   Discussed expectations of SSRI medication including time to effectiveness and mechanism of action, also poss of side effects (early and late)- including mental fuzziness, weight or appetite change, nausea and poss of worse dep or anxiety (even suicidal thoughts)  Pt voiced understanding and will stop med and update if this occurs        Left knee pain - Primary    For several weeks with no known trauma (on and off)  Some swelling (and warmth prior) Under care of orthopedics Reviewed 3 notes, imaging reports and pictures today as well as negative [place] for DVT  There is some deg change in the knee Is being treated for cellulitis as well and some improvement  Today exam is notable for posterior/medial circumscribed lump (rubbery and not fluctuant) that is mobile  ? If possible lipoma or lymphedema and if so adding to pain  Surgical consult or imaging may clarify Pt plans to discuss with Dr Sabra Heck on Monday and in the meantime continue compression and ice  ER parameters discussed

## 2021-07-04 NOTE — Assessment & Plan Note (Signed)
For several weeks with no known trauma (on and off)  Some swelling (and warmth prior) Under care of orthopedics Reviewed 3 notes, imaging reports and pictures today as well as negative [place] for DVT  There is some deg change in the knee Is being treated for cellulitis as well and some improvement  Today exam is notable for posterior/medial circumscribed lump (rubbery and not fluctuant) that is mobile  ? If possible lipoma or lymphedema and if so adding to pain  Surgical consult or imaging may clarify Pt plans to discuss with Dr Sabra Heck on Monday and in the meantime continue compression and ice  ER parameters discussed

## 2021-07-09 ENCOUNTER — Encounter: Payer: Self-pay | Admitting: Family Medicine

## 2021-07-09 DIAGNOSIS — M7052 Other bursitis of knee, left knee: Secondary | ICD-10-CM | POA: Diagnosis not present

## 2021-07-19 ENCOUNTER — Other Ambulatory Visit: Payer: Self-pay | Admitting: Cardiovascular Disease

## 2021-07-19 DIAGNOSIS — I4891 Unspecified atrial fibrillation: Secondary | ICD-10-CM

## 2021-07-19 DIAGNOSIS — I4892 Unspecified atrial flutter: Secondary | ICD-10-CM

## 2021-07-19 NOTE — Telephone Encounter (Signed)
Please fill rx for 1 month supply and call patient to have CBC and BMET drawn at earliest convenience

## 2021-07-19 NOTE — Telephone Encounter (Signed)
PT IS OVERDUE FOR LABS WILL ROUTE TO [facility]AR[facility] POOL FOR FURTHER ADVISEMENT.  Prescription refill request for Xarelto received.  Indication:afib Last office visit:gollan 03/06/21 Weight:91.7kg Age:75fScr: 1.060 mg/ 06/28/2020 OVERDUE  CrCl:66.4 BASED ON OVERDUE LABS

## 2021-07-19 NOTE — Telephone Encounter (Signed)
Lmom to complete labs

## 2021-07-20 DIAGNOSIS — I4892 Unspecified atrial flutter: Secondary | ICD-10-CM | POA: Diagnosis not present

## 2021-07-20 DIAGNOSIS — I4891 Unspecified atrial fibrillation: Secondary | ICD-10-CM | POA: Diagnosis not present

## 2021-07-21 LAB — COMPREHENSIVE METABOLIC PANEL
ALT: 15 IU/L (ref 0–32)
AST: 14 IU/L (ref 0–40)
Albumin/Globulin Ratio: 1.8 (ref 1.2–2.2)
Albumin: 4.2 g/dL (ref 3.7–4.7)
Alkaline Phosphatase: 102 IU/L (ref 44–121)
BUN/Creatinine Ratio: 17 (ref 12–28)
BUN: 17 mg/dL (ref 8–27)
Bilirubin Total: <0.2 mg/dL (ref 0.0–1.2)
CO2: 22 mmol/L (ref 20–29)
Calcium: 8.9 mg/dL (ref 8.7–10.3)
Chloride: 103 mmol/L (ref 96–106)
Creatinine, Ser: 1.03 mg/dL — ABNORMAL HIGH (ref 0.57–1.00)
Globulin, Total: 2.3 g/dL (ref 1.5–4.5)
Glucose: 107 mg/dL — ABNORMAL HIGH (ref 65–99)
Potassium: 4.3 mmol/L (ref 3.5–5.2)
Sodium: 141 mmol/L (ref 134–144)
Total Protein: 6.5 g/dL (ref 6.0–8.5)
eGFR: 56 mL/min/{1.73_m2} — ABNORMAL LOW (ref >59)

## 2021-07-21 LAB — CBC
Hematocrit: 43.2 % (ref 34.0–46.6)
Hemoglobin: 14.2 g/dL (ref 11.1–15.9)
MCH: 28.1 pg (ref 26.6–33.0)
MCHC: 32.9 g/dL (ref 31.5–35.7)
MCV: 85 fL (ref 79–97)
Platelets: 282 10*3/uL (ref 150–450)
RBC: 5.06 x10E6/uL (ref 3.77–5.28)
RDW: 14.7 % (ref 11.7–15.4)
WBC: 11.7 10*3/uL — ABNORMAL HIGH (ref 3.4–10.8)

## 2021-08-16 ENCOUNTER — Other Ambulatory Visit: Payer: Self-pay | Admitting: Family Medicine

## 2021-08-16 ENCOUNTER — Other Ambulatory Visit: Payer: Self-pay | Admitting: Cardiovascular Disease

## 2021-08-16 NOTE — Telephone Encounter (Signed)
Refill request

## 2021-08-16 NOTE — Telephone Encounter (Signed)
Prescription refill request for Xarelto received.  Indication:afib Last office visit:gollan 03/06/21 Weight:91.7kg Age:75f Scr:1.03 07/20/21 CrCl:67.3

## 2021-08-22 ENCOUNTER — Other Ambulatory Visit: Payer: Self-pay | Admitting: Family Medicine

## 2021-08-22 DIAGNOSIS — Z1231 Encounter for screening mammogram for malignant neoplasm of breast: Secondary | ICD-10-CM

## 2021-09-19 ENCOUNTER — Other Ambulatory Visit: Payer: Self-pay | Admitting: Family Medicine

## 2021-10-08 ENCOUNTER — Other Ambulatory Visit: Payer: Self-pay

## 2021-10-08 ENCOUNTER — Ambulatory Visit
Admission: RE | Admit: 2021-10-08 | Discharge: 2021-10-08 | Disposition: A | Discharge status: Home / Self Care | Payer: Medicare HMO | Source: Ambulatory Visit | Attending: Family Medicine | Admitting: Family Medicine

## 2021-10-08 DIAGNOSIS — Z1231 Encounter for screening mammogram for malignant neoplasm of breast: Secondary | ICD-10-CM | POA: Diagnosis not present

## 2021-11-05 ENCOUNTER — Other Ambulatory Visit: Payer: Self-pay | Admitting: Cardiovascular Disease

## 2021-11-06 NOTE — Telephone Encounter (Signed)
Please advise on refill. Per last office visit, Dr. Rockey Situ advised patient to hold medication due to swelling. Thank you!

## 2021-11-07 NOTE — Telephone Encounter (Signed)
Called to speak with patient to see if she is taking amlodipine or if she stopped it due to swelling. Spoke with husband states she is not home at this time but he will have her call back in a few hours.

## 2021-11-09 NOTE — Telephone Encounter (Signed)
Patient states she stopped taking this medication for a few days but went back on it and has not had any recurring issues with swelling.

## 2021-12-31 ENCOUNTER — Telehealth: Payer: Self-pay | Admitting: Family Medicine

## 2021-12-31 NOTE — Telephone Encounter (Signed)
LVM for pt to rtn my call to schedule AWV with [facility]. Please schedule appt if pt calls the office.  

## 2022-01-26 ENCOUNTER — Other Ambulatory Visit: Payer: Self-pay | Admitting: Family Medicine

## 2022-02-07 ENCOUNTER — Telehealth: Payer: Self-pay | Admitting: Family Medicine

## 2022-02-07 NOTE — Telephone Encounter (Signed)
LVM for pt to rtn my call to schedle AWV with [facility]. Please schedule appt if pt calls the office.  ?

## 2022-02-11 ENCOUNTER — Other Ambulatory Visit: Payer: Self-pay | Admitting: Cardiovascular Disease

## 2022-02-11 NOTE — Telephone Encounter (Signed)
Prescription refill request for Xarelto received.  ?Indication: Atrial Fib ?Last office visit: 03/06/21  Johnny Bridge MD ?Weight: 94.1kg ?Age: 76 ?Scr: 1.03 on 07/20/21 ?CrCl: 69.03 ? ?Based on above findings Xarelto '20mg'$  daily is the appropriate dose.  Pt is due to see Dr Rockey Situ back this Month.  Message sent to [facility] to make appt that is in recall.  Refill approved x 1. ? ?

## 2022-02-11 NOTE — Telephone Encounter (Signed)
Attempted to schedule.  

## 2022-02-11 NOTE — Telephone Encounter (Signed)
Refill Request.  

## 2022-03-08 ENCOUNTER — Other Ambulatory Visit: Payer: Self-pay | Admitting: Cardiovascular Disease

## 2022-03-13 NOTE — Progress Notes (Signed)
Cardiology Office Note ? ?Date:  03/15/2022  ? ?ID:  MORGEN LINEBAUGH, DOB 06-Dec-1945, MRN [id number] ? ?PCP:  Abner Greenspan, MD  ? ?Chief Complaint  ?Patient presents with  ? 12 month follow up   ?  "Doing well." Medications reviewed by the patient verbally.   ? ? ?HPI:  ?Kerry Simpson is a pleasant 76year-old woman with history of  ?obesity,  ?hypertension,  ?GERD  ?Paroxysmal atrial fibrillation, on amiodarone ?who presents for routine follow-up for persistent atrial fibrillation ? ?Last seen by myself in clinic April 2022 ?In general on today's visit reports she feels well ?[place] regular exercise program[place] ?Denies leg swelling, Rare lasix use ? ?No shortness of breath or chest pain concerning for angina ? ?Husband retired ? ?Previously reports she was asymptomatic from her atrial fibrillation ?Does not have a monitoring system for heart rate apart from blood pressure cuff ? ?Labs reviewed with her in detail ?HBA1C 6.2 ?totaL CHOL 141,  Ldl 67 ?HCT 43 ?TSH normal from 2021 ? ?EKG personally reviewed by myself on todays visit ?shows normal sinus rhythm with rate 72 bpm, left axis deviation poor R wave progression to the anterior precordial leads ? ?Other past medical history reviewed ?Echo 02/2016: ?EF 60%, RVSp 38 ? ?Started on amio 03/2016: ?CHADS-VASc score is greater than or equal to 3 (hypertension, age, gender) ?  ?Seen by dr. [name] 04/2016:  ?Tachy/brady ?digoxin held ?sleep disordered breathing and evidence of HFpEF. ? ?Does not think she has OSA ?Cancelled sleep study ?Previous cardioversion cancelled, she converted on her own on amio ?  ?Echocardiogram  showing normal LV function, mildly elevated right heart pressures ?  ? ?PMH:   has a past medical history of Anemia, Atrial fibrillation ([place]), Cataract ([id number]), Complication of anesthesia, Difficult intubation, Fibrocystic breast, Fracture of right shoulder, Gallstones, Grave's disease, Graves disease, History of kidney stones,  Hypertension, Obesity, and Osteoporosis. ? ?PSH:    ?Past Surgical History:  ?Procedure Laterality Date  ? COLONOSCOPY  09/29/2014  ? Normal, Delfin Edis, MD  ? ELECTROPHYSIOLOGIC STUDY N/A 04/12/2016  ? Procedure: CARDIOVERSION;  Surgeon: Minna Merritts, MD;  Location: [facility] ORS;  Service: Cardiovascular;  Laterality: N/A;  ? ESOPHAGOGASTRODUODENOSCOPY (EGD) WITH PROPOFOL N/A 09/08/2018  ? Procedure: ESOPHAGOGASTRODUODENOSCOPY (EGD) WITH PROPOFOL;  Surgeon: Lollie Sails, MD;  Location: [facility] ENDOSCOPY;  Service: Endoscopy;  Laterality: N/A;  ? ESOPHAGOGASTRODUODENOSCOPY ENDOSCOPY    ? EUS N/A 01/12/2015  ? Procedure: UPPER ENDOSCOPIC ULTRASOUND (EUS) LINEAR;  Surgeon: Milus Banister, MD;  Location: [facility] ENDOSCOPY;  Service: Endoscopy;  Laterality: N/A;  ? TUBAL LIGATION    ? ? ?Current Outpatient Medications  ?Medication Sig Dispense Refill  ? acetaminophen (TYLENOL) 500 MG tablet Take 1,000 mg by mouth daily as needed.     ? amiodarone (PACERONE) 200 MG tablet Take 1 tablet (200 mg total) by mouth daily. 90 tablet 3  ? amLODipine (NORVASC) 5 MG tablet TAKE 1 TABLET BY MOUTH ONCE A DAY 30 tablet 0  ? Cholecalciferol (VITAMIN D3 PO) Take 400 Units by mouth daily.    ? Cyanocobalamin (VITAMIN B 12 PO) Take by mouth daily.    ? fosinopril (MONOPRIL) 20 MG tablet TAKE 1 TABLET BY MOUTH ONCE DAILY 90 tablet 1  ? furosemide (LASIX) 20 MG tablet Take 1 tablet (20 mg total) by mouth daily as needed. 90 tablet 3  ? Multiple Vitamin (MULTIVITAMIN) capsule Take 1 capsule by mouth daily.    ?  omeprazole (PRILOSEC) 20 MG capsule TAKE 1 CAPSULE BY MOUTH ONCE DAILY 90 capsule 1  ? PARoxetine (PAXIL) 20 MG tablet Take 1 tablet (20 mg total) by mouth daily. 90 tablet 3  ? rivaroxaban (XARELTO) 20 MG TABS tablet TAKE 1 TABLET BY MOUTH ONCE A DAY WITH SUPPER 90 tablet 0  ? cephALEXin (KEFLEX) 500 MG capsule Take 1 capsule by mouth every 6 (six) hours. (Patient not taking: Reported on 03/15/2022)    ? ?No current facility-administered  medications for this visit.  ? ? ? ?Allergies:   Patient has no known allergies.  ? ?Social History:  The patient  reports that she has never smoked. She has never used smokeless tobacco. She reports that she does not drink alcohol and does not use drugs.  ? ?Family History:   family history includes Cancer in her brother; Diabetes in her father; Heart disease in her brother; Hypertension in her brother, father, and mother.  ? ? ?Review of Systems: ?Review of Systems  ?Constitutional: Negative.   ?HENT: Negative.    ?Respiratory: Negative.    ?Cardiovascular: Negative.   ?Gastrointestinal: Negative.   ?Musculoskeletal: Negative.   ?Neurological: Negative.   ?Psychiatric/Behavioral: Negative.    ?All other systems reviewed and are negative. ? ?PHYSICAL EXAM: ?VS:  BP 136/70 (BP Location: Left Arm, Patient Position: Sitting, Cuff Size: Normal)   Pulse 72   Ht '5\' 1"'$  (1.549 m)   Wt 203 lb (92.1 kg)   SpO2 94%   BMI 38.36 kg/m?  , BMI Body mass index is 38.36 kg/m?Marland Kitchen ?Constitutional:  oriented to person, place, and time. No distress.  ?HENT:  ?Head: Grossly normal ?Eyes:  no discharge. No scleral icterus.  ?Neck: No JVD, no carotid bruits  ?Cardiovascular: Regular rate and rhythm, no murmurs appreciated ?Pulmonary/Chest: Clear to auscultation bilaterally, no wheezes or rails ?Abdominal: Soft.  no distension.  no tenderness.  ?Musculoskeletal: Normal range of motion ?Neurological:  normal muscle tone. Coordination normal. No atrophy ?Skin: Skin warm and dry ?Psychiatric: normal affect, pleasant ? ?Recent Labs: ?07/20/2021: ALT 15; BUN 17; Creatinine, Ser 1.03; Hemoglobin 14.2; Platelets 282; Potassium 4.3; Sodium 141  ? ? ?Lipid Panel ?Lab Results  ?Component Value Date  ? CHOL 141 06/28/2020  ? HDL 53.80 06/28/2020  ? [place] 67 06/28/2020  ? TRIG 102.0 06/28/2020  ? ?  ? ?Wt Readings from Last 3 Encounters:  ?03/15/22 203 lb (92.1 kg)  ?07/04/21 202 lb 4 oz (91.7 kg)  ?03/06/21 207 lb 8 oz (94.1 kg)  ?  ? ?ASSESSMENT  AND PLAN: ? ?Essential hypertension - Plan: EKG 12-Lead ?Blood pressure is well controlled on today's visit. No changes made to the medications. ? ?Atrial fibrillation and flutter ([place]) -  ?As far she is aware not having any arrhythmia, previously asymptomatic ?On anticoagulation, rate control with amiodarone ?In the setting of sick sinus syndrome she is not on beta blocker, previously had bradycardia ?Maintaining normal sinus rhythm ?We will order TSH ? ?Class 2 obesity due to excess calories without serious comorbidity with body mass index (BMI) of 37.0 to 37.9 in adult ?Blood pressure is well controlled on today's visit. No changes made to the medications. ? ?DOE (dyspnea on exertion) ?Secondary to obesity, deconditioning ?Reports symptoms are stable ?Recommended weight loss and walking program ? ?Chest pain, unspecified type ?No recent symptoms ?No further ischemic work-up ordered at this time ? ?Sleep disturbance ?Reports sleeping relatively well ?Up and down going to the bathroom ?Recommended walking program, weight loss ? ?  Total encounter time more than 30 minutes ? Greater than 50% was spent in counseling and coordination of care with the patient ? ? ?Orders Placed This Encounter  ?Procedures  ? EKG 12-Lead  ? ? ? ?Signed, ?Esmond Plants, M.D., Ph.D. ?03/15/2022  ?[place] ?[phone] ? ? ?

## 2022-03-15 ENCOUNTER — Encounter: Payer: Self-pay | Admitting: Cardiovascular Disease

## 2022-03-15 ENCOUNTER — Ambulatory Visit: Payer: Medicare HMO | Admitting: Cardiovascular Disease

## 2022-03-15 VITALS — BP 136/70 | HR 72 | Ht 61.0 in | Wt 203.0 lb

## 2022-03-15 DIAGNOSIS — I4891 Unspecified atrial fibrillation: Secondary | ICD-10-CM | POA: Diagnosis not present

## 2022-03-15 DIAGNOSIS — I10 Essential (primary) hypertension: Secondary | ICD-10-CM

## 2022-03-15 DIAGNOSIS — I4892 Unspecified atrial flutter: Secondary | ICD-10-CM

## 2022-03-15 DIAGNOSIS — I5030 Unspecified diastolic (congestive) heart failure: Secondary | ICD-10-CM | POA: Diagnosis not present

## 2022-03-15 DIAGNOSIS — Z5181 Encounter for therapeutic drug level monitoring: Secondary | ICD-10-CM

## 2022-03-15 NOTE — Patient Instructions (Addendum)
Medication Instructions:  ?No changes ? ?If you need a refill on your cardiac medications before your next appointment, please call your pharmacy.  ? ?Lab work: ? ?Today: TSH  ? ?Please go to the [facility] to have this drawn.  ? ?Nature conservation officer at [facility] ?1st desk on the right to check in, past the screening table ?Lab hours: Monday- Friday (7:30 am- 5:30 pm)  ? ?Testing/Procedures: ? ?Chest xray - Your physician has requested that you have a chest xray, is a fast and painless imaging test that uses certain electromagnetic waves to create pictures of the structures in and around your chest.  ?This will be completed at the [facility].  ? ?Follow-Up: ?At [facility], you and your health needs are our priority.  As part of our continuing mission to provide you with exceptional heart care, we have created designated Provider Care Teams.  These Care Teams include your primary Cardiologist (physician) and Advanced Practice Providers (APPs -  Physician Assistants and Nurse Practitioners) who all work together to provide you with the care you need, when you need it. ? ?You will need a follow up appointment in 12 months ? ?Providers on your designated Care Team:   ?Murray Hodgkins, NP ?Christell Faith, PA-C ?Cadence Kathlen Mody, PA-C ? ?COVID-19 Vaccine Information can be found at: [email or web address] For questions related to vaccine distribution or appointments, please email [email or web address] or call [phone].  ? ?

## 2022-03-26 ENCOUNTER — Telehealth: Payer: Self-pay | Admitting: Family Medicine

## 2022-03-26 NOTE — Telephone Encounter (Signed)
Left message for patient to call back and schedule Medicare Annual Wellness Visit (AWV) either virtually or phone ? ? ?Last AWV ;06/30/20  please schedule at anytime with health coach ? ?I left my direct # [phone]  ?

## 2022-03-28 ENCOUNTER — Ambulatory Visit (INDEPENDENT_AMBULATORY_CARE_PROVIDER_SITE_OTHER): Payer: Medicare HMO

## 2022-03-28 VITALS — Ht 61.0 in | Wt 202.0 lb

## 2022-03-28 DIAGNOSIS — Z0000 Encounter for general adult medical examination without abnormal findings: Secondary | ICD-10-CM | POA: Diagnosis not present

## 2022-03-28 NOTE — Patient Instructions (Signed)
Kerry Simpson , Thank you for taking time to come for your Medicare Wellness Visit. I appreciate your ongoing commitment to your health goals. Please review the following plan we discussed and let me know if I can assist you in the future.   Screening recommendations/referrals: Colonoscopy: not required Mammogram: completed 10/08/2021, due 10/09/2022 Bone Density: completed 06/17/2018 Recommended yearly ophthalmology/optometry visit for glaucoma screening and checkup Recommended yearly dental visit for hygiene and checkup  Vaccinations: Influenza vaccine: due 06/11/2022 Pneumococcal vaccine: completed 04/18/2014 Tdap vaccine: completed 11/07/2021, due 11/08/2031 Shingles vaccine: completed   Covid-19: 03/05/2021, 08/25/2020, 12/22/2019, 11/30/2019  Advanced directives: Please bring a copy of your POA (Power of Attorney) and/or Living Will to your next appointment.   Conditions/risks identified: none  Next appointment: Follow up in one year for your annual wellness visit    Preventive Care 65 Years and Older, Female Preventive care refers to lifestyle choices and visits with your health care provider that can promote health and wellness. What does preventive care include? A yearly physical exam. This is also called an annual well check. Dental exams once or twice a year. Routine eye exams. Ask your health care provider how often you should have your eyes checked. Personal lifestyle choices, including: Daily care of your teeth and gums. Regular physical activity. Eating a healthy diet. Avoiding tobacco and drug use. Limiting alcohol use. Practicing safe sex. Taking low-dose aspirin every day. Taking vitamin and mineral supplements as recommended by your health care provider. What happens during an annual well check? The services and screenings done by your health care provider during your annual well check will depend on your age, overall health, lifestyle risk factors, and family history  of disease. Counseling  Your health care provider may ask you questions about your: Alcohol use. Tobacco use. Drug use. Emotional well-being. Home and relationship well-being. Sexual activity. Eating habits. History of falls. Memory and ability to understand (cognition). Work and work Statistician. Reproductive health. Screening  You may have the following tests or measurements: Height, weight, and BMI. Blood pressure. Lipid and cholesterol levels. These may be checked every 5 years, or more frequently if you are over 37 years old. Skin check. Lung cancer screening. You may have this screening every year starting at age 65 if you have a 30-pack-year history of smoking and currently smoke or have quit within the past 15 years. Fecal occult blood test (FOBT) of the stool. You may have this test every year starting at age 28. Flexible sigmoidoscopy or colonoscopy. You may have a sigmoidoscopy every 5 years or a colonoscopy every 10 years starting at age 86. Hepatitis C blood test. Hepatitis B blood test. Sexually transmitted disease (STD) testing. Diabetes screening. This is done by checking your blood sugar (glucose) after you have not eaten for a while (fasting). You may have this done every 1-3 years. Bone density scan. This is done to screen for osteoporosis. You may have this done starting at age 80. Mammogram. This may be done every 1-2 years. Talk to your health care provider about how often you should have regular mammograms. Talk with your health care provider about your test results, treatment options, and if necessary, the need for more tests. Vaccines  Your health care provider may recommend certain vaccines, such as: Influenza vaccine. This is recommended every year. Tetanus, diphtheria, and acellular pertussis (Tdap, Td) vaccine. You may need a Td booster every 10 years. Zoster vaccine. You may need this after age 9. Pneumococcal 13-valent conjugate (PCV13)  vaccine. One  dose is recommended after age 34. Pneumococcal polysaccharide (PPSV23) vaccine. One dose is recommended after age 43. Talk to your health care provider about which screenings and vaccines you need and how often you need them. This information is not intended to replace advice given to you by your health care provider. Make sure you discuss any questions you have with your health care provider. Document Released: 11/24/2015 Document Revised: 07/17/2016 Document Reviewed: 08/29/2015 Elsevier Interactive Patient Education  2017 [place] Prevention in the Home Falls can cause injuries. They can happen to people of all ages. There are many things you can do to make your home safe and to help prevent falls. What can I do on the outside of my home? Regularly fix the edges of walkways and driveways and fix any cracks. Remove anything that might make you trip as you walk through a door, such as a raised step or threshold. Trim any bushes or trees on the path to your home. Use bright outdoor lighting. Clear any walking paths of anything that might make someone trip, such as rocks or tools. Regularly check to see if handrails are loose or broken. Make sure that both sides of any steps have handrails. Any raised decks and porches should have guardrails on the edges. Have any leaves, snow, or ice cleared regularly. Use sand or salt on walking paths during winter. Clean up any spills in your garage right away. This includes oil or grease spills. What can I do in the bathroom? Use night lights. Install grab bars by the toilet and in the tub and shower. Do not use towel bars as grab bars. Use non-skid mats or decals in the tub or shower. If you need to sit down in the shower, use a plastic, non-slip stool. Keep the floor dry. Clean up any water that spills on the floor as soon as it happens. Remove soap buildup in the tub or shower regularly. Attach bath mats securely with double-sided  non-slip rug tape. Do not have throw rugs and other things on the floor that can make you trip. What can I do in the bedroom? Use night lights. Make sure that you have a light by your bed that is easy to reach. Do not use any sheets or blankets that are too big for your bed. They should not hang down onto the floor. Have a firm chair that has side arms. You can use this for support while you get dressed. Do not have throw rugs and other things on the floor that can make you trip. What can I do in the kitchen? Clean up any spills right away. Avoid walking on wet floors. Keep items that you use a lot in easy-to-reach places. If you need to reach something above you, use a strong step stool that has a grab bar. Keep electrical cords out of the way. Do not use floor polish or wax that makes floors slippery. If you must use wax, use non-skid floor wax. Do not have throw rugs and other things on the floor that can make you trip. What can I do with my stairs? Do not leave any items on the stairs. Make sure that there are handrails on both sides of the stairs and use them. Fix handrails that are broken or loose. Make sure that handrails are as long as the stairways. Check any carpeting to make sure that it is firmly attached to the stairs. Fix any carpet that is loose  or worn. Avoid having throw rugs at the top or bottom of the stairs. If you do have throw rugs, attach them to the floor with carpet tape. Make sure that you have a light switch at the top of the stairs and the bottom of the stairs. If you do not have them, ask someone to add them for you. What else can I do to help prevent falls? Wear shoes that: Do not have high heels. Have rubber bottoms. Are comfortable and fit you well. Are closed at the toe. Do not wear sandals. If you use a stepladder: Make sure that it is fully opened. Do not climb a closed stepladder. Make sure that both sides of the stepladder are locked into place. Ask  someone to hold it for you, if possible. Clearly mark and make sure that you can see: Any grab bars or handrails. First and last steps. Where the edge of each step is. Use tools that help you move around (mobility aids) if they are needed. These include: Canes. Walkers. Scooters. Crutches. Turn on the lights when you go into a dark area. Replace any light bulbs as soon as they burn out. Set up your furniture so you have a clear path. Avoid moving your furniture around. If any of your floors are uneven, fix them. If there are any pets around you, be aware of where they are. Review your medicines with your doctor. Some medicines can make you feel dizzy. This can increase your chance of falling. Ask your doctor what other things that you can do to help prevent falls. This information is not intended to replace advice given to you by your health care provider. Make sure you discuss any questions you have with your health care provider. Document Released: 08/24/2009 Document Revised: 04/04/2016 Document Reviewed: 12/02/2014 Elsevier Interactive Patient Education  2017 [employer].

## 2022-03-28 NOTE — Progress Notes (Signed)
I connected with Kerry Simpson today by telephone and verified that I am speaking with the correct person using two identifiers. Location patient: home Location provider: work Persons participating in the virtual visit: Kerry, [name].   I discussed the limitations, risks, security and privacy concerns of performing an evaluation and management service by telephone and the availability of in person appointments. I also discussed with the patient that there may be a patient responsible charge related to this service. The patient expressed understanding and verbally consented to this telephonic visit.    Interactive audio and video telecommunications were attempted between this provider and patient, however failed, due to patient having technical difficulties OR patient did not have access to video capability.  We continued and completed visit with audio only.     Vital signs may be patient reported or missing.  Subjective:   Kerry Simpson is a 76 y.o. female who presents for Medicare Annual (Subsequent) preventive examination.  Review of Systems     Cardiac Risk Factors include: advanced age (>76mn, >>43women);hypertension;obesity (BMI >30kg/m2)     Objective:    Today's Vitals   03/28/22 1556  Weight: 202 lb (91.6 kg)  Height: '5\' 1"'$  (1.549 m)   Body mass index is 38.17 kg/m.     03/28/2022    4:00 PM 09/08/2018   12:30 PM 05/13/2018    9:56 AM 05/07/2017   10:10 AM 04/12/2016    6:52 AM 01/12/2015    8:09 AM 09/15/2014    8:35 AM  Advanced Directives  Does Patient Have a Medical Advance Directive? Yes Yes Yes Yes Yes Yes Yes  Type of AParamedicof [place]Living will HBannerLiving will H[place]khart LakeLiving will [place]ites[place]Living will  Living will;Healthcare Power of [place] will  Does patient want to make changes to medical advance directive?      No - Patient declined    Copy of HBonner-[place] Riversidein Chart? No - copy requested Yes No - copy requested No - copy requested No - copy requested No - copy requested     Current Medications (verified) Outpatient Encounter Medications as of 03/28/2022  Medication Sig   acetaminophen (TYLENOL) 500 MG tablet Take 1,000 mg by mouth daily as needed.    amiodarone (PACERONE) 200 MG tablet Take 1 tablet (200 mg total) by mouth daily.   amLODipine (NORVASC) 5 MG tablet TAKE 1 TABLET BY MOUTH ONCE A DAY   Cholecalciferol (VITAMIN D3 PO) Take 400 Units by mouth daily.   Cyanocobalamin (VITAMIN B 12 PO) Take by mouth daily.   furosemide (LASIX) 20 MG tablet Take 1 tablet (20 mg total) by mouth daily as needed.   Multiple Vitamin (MULTIVITAMIN) capsule Take 1 capsule by mouth daily.   omeprazole (PRILOSEC) 20 MG capsule TAKE 1 CAPSULE BY MOUTH ONCE DAILY   PARoxetine (PAXIL) 20 MG tablet Take 1 tablet (20 mg total) by mouth daily.   rivaroxaban (XARELTO) 20 MG TABS tablet TAKE 1 TABLET BY MOUTH ONCE A DAY WITH SUPPER   cephALEXin (KEFLEX) 500 MG capsule Take 1 capsule by mouth every 6 (six) hours. (Patient not taking: Reported on 03/15/2022)   fosinopril (MONOPRIL) 20 MG tablet TAKE 1 TABLET BY MOUTH ONCE DAILY (Patient not taking: Reported on 03/28/2022)   No facility-administered encounter medications on file as of 03/28/2022.    Allergies (verified) Patient has no known allergies.   History:  Past Medical History:  Diagnosis Date   Anemia    iron def   Atrial fibrillation ([place])    Dr Rockey Situ   Cataract 05/13/2018   both eyes per last vision exam    Complication of anesthesia    "quit breathing" -Endoscopy( 12-22-14)   Difficult intubation    Fibrocystic breast    Fracture of right shoulder    Gallstones    Grave's disease    Graves disease    History of kidney stones    not a problem at present   Hypertension    Obesity    Osteoporosis    osteopenia   Past Surgical  History:  Procedure Laterality Date   COLONOSCOPY  09/29/2014   Normal, Delfin Edis, MD   ELECTROPHYSIOLOGIC STUDY N/A 04/12/2016   Procedure: CARDIOVERSION;  Surgeon: Minna Merritts, MD;  Location: [facility] ORS;  Service: Cardiovascular;  Laterality: N/A;   ESOPHAGOGASTRODUODENOSCOPY (EGD) WITH PROPOFOL N/A 09/08/2018   Procedure: ESOPHAGOGASTRODUODENOSCOPY (EGD) WITH PROPOFOL;  Surgeon: Lollie Sails, MD;  Location[facility] ENDOSCOPY;  Service: Endoscopy;  Laterality: N/A;   ESOPHAGOGASTRODUODENOSCOPY ENDOSCOPY     EUS N/A 01/12/2015   Procedure: UPPER ENDOSCOPIC ULTRASOUND (EUS) LINEAR;  Surgeon: Milus Banister, MD;  Location: [facility] ENDOSCOPY;  Service: Endoscopy;  Laterality: N/A;   TUBAL LIGATION     Family History  Problem Relation Age of Onset   Hypertension Mother    Diabetes Father    Hypertension Father    Hypertension Brother    Cancer Brother        prostate   Heart disease Brother        with bypass   Colon cancer Neg Hx    Breast cancer Neg Hx    Social History   Socioeconomic History   Marital status: Married    Spouse name: Not on file   Number of children: Not on file   Years of education: Not on file   Highest education level: Not on file  Occupational History   Occupation: Retired    Fish farm manager: RETIRED  Tobacco Use   Smoking status: Never   Smokeless tobacco: Never  Vaping Use   Vaping Use: Never used  Substance and Sexual Activity   Alcohol use: No    Alcohol/week: 0.0 standard drinks   Drug use: No   Sexual activity: Not Currently  Other Topics Concern   Not on file  Social History Narrative   Not on file   Social Determinants of Health   Financial Resource Strain: Low Risk    Difficulty of Paying Living Expenses: Not hard at all  Food Insecurity: No Food Insecurity   Worried About Charity fundraiser in the Last Year: Never true   [place] in the Last Year: Never true  Transportation Needs: No Transportation Needs   Lack of  Transportation (Medical): No   Lack of Transportation (Non-Medical): No  Physical Activity: Inactive   Days of Exercise per Week: 0 days   Minutes of Exercise per Session: 0 min  Stress: No Stress Concern Present   Feeling of Stress : Not at all  Social Connections: Not on file    Tobacco Counseling Counseling given: Not Answered   Clinical Intake:  Pre-visit preparation completed: Yes  Pain : No/denies pain     Nutritional Status: BMI > 30  Obese Nutritional Risks: None Diabetes: No  How often do you need to have someone help you when you read instructions, pamphlets, or  other written materials from your doctor or pharmacy?: 1 - Never What is the last grade level you completed in school?: college  Diabetic? no  Interpreter Needed?: No  Information entered by :: NAllen LPN   Activities of Daily Living    03/28/2022    4:01 PM  In your present state of health, do you have any difficulty performing the following activities:  Hearing? 0  Vision? 1  Comment no night driving  Difficulty concentrating or making decisions? 0  Walking or climbing stairs? 0  Dressing or bathing? 0  Doing errands, shopping? 0  Preparing Food and eating ? N  Using the Toilet? N  In the past six months, have you accidently leaked urine? N  Do you have problems with loss of bowel control? N  Managing your Medications? N  Managing your Finances? N  Housekeeping or managing your Housekeeping? N    Patient Care Team: Tower, Wynelle Fanny, MD as PCP - General Tower, Wynelle Fanny, MD as Consulting Physician (Family Medicine) Bary Castilla, Forest Gleason, MD (General Surgery) Minna Merritts, MD as Consulting Physician (Cardiology)  Indicate any recent Medical Services you may have received from other than [facility] providers in the past year (date may be approximate).     Assessment:   This is a routine wellness examination for Justin.  Hearing/Vision screen Vision Screening - Comments:: No regular eye  exams,   Dietary issues and exercise activities discussed: Current Exercise Habits: The patient does not participate in regular exercise at present   Goals Addressed             This Visit's Progress    Patient Stated       03/28/2022, wants to lose weight       Depression Screen    03/28/2022    4:01 PM 06/30/2020   11:24 AM 05/26/2019   10:35 AM 05/13/2018    9:59 AM 05/07/2017   10:00 AM 10/13/2015    8:49 AM 04/18/2014    8:43 AM  PHQ 2/9 Scores  PHQ - 2 Score 0 0 0 0 0 0 0  PHQ- 9 Score    0       Fall Risk    03/28/2022    4:01 PM 06/30/2020   11:24 AM 05/26/2019   10:35 AM 05/13/2018    9:59 AM 05/07/2017   10:00 AM  [place] in the past year? 0 0 0 No No  Number falls in past yr: 0  0    Injury with Fall? 0      Risk for fall due to : Medication side effect      Follow up Falls evaluation completed;Education provided;Falls prevention discussed Falls evaluation completed       FALL RISK PREVENTION PERTAINING TO THE HOME:  Any stairs in or around the home? No  If so, are there any without handrails?  N/a Home free of loose throw rugs in walkways, pet beds, electrical cords, etc? Yes  Adequate lighting in your home to reduce risk of falls? Yes   ASSISTIVE DEVICES UTILIZED TO PREVENT FALLS:  Life alert? No  Use of a cane, walker or w/c? No  Grab bars in the bathroom? No  Shower chair or bench in shower? No  Elevated toilet seat or a handicapped toilet? Yes   TIMED UP AND GO:  Was the test performed? No .      Cognitive Function:    05/13/2018    9:58  AM 05/07/2017   10:05 AM  MMSE - Mini Mental State Exam  Orientation to time 5 5  Orientation to Place 5 5  Registration 3 3  Attention/ Calculation 0 0  Recall 3 3  Language- name 2 objects 0 0  Language- repeat 1 1  Language- follow 3 step command 3 3  Language- read & follow direction 0 0  Write a sentence 0 0  Copy design 0 0  Total score 20 20        03/28/2022    4:03 PM  6CIT  Screen  What Year? 0 points  What month? 0 points  What time? 0 points  Count back from 20 0 points  Months in reverse 0 points  Repeat phrase 0 points  Total Score 0 points    Immunizations Immunization History  Administered Date(s) Administered   Fluad Quad(high Dose 65+) 08/24/2020   Influenza Split 08/01/2011, 01/08/2012   Influenza Whole 09/25/2007, 08/22/2008, 10/12/2009, 07/11/2010, 08/18/2012   Influenza, High Dose Seasonal PF 06/25/2019   Influenza,inj,Quad PF,6+ Mos 10/13/2015, 08/20/2016, 07/23/2018   Influenza-Unspecified 09/22/2017   PFIZER(Purple Top)SARS-COV-2 Vaccination 11/30/2019, 12/22/2019, 08/25/2020, 03/05/2021   Pneumococcal Conjugate-13 04/18/2014   Pneumococcal Polysaccharide-23 09/28/2010   Tdap 11/07/2021   Zoster Recombinat (Shingrix) 11/07/2021, 01/31/2022   Zoster, Live 11/12/2013    TDAP status: Due, Education has been provided regarding the importance of this vaccine. Advised may receive this vaccine at local pharmacy or Health Dept. Aware to provide a copy of the vaccination record if obtained from local pharmacy or Health Dept. Verbalized acceptance and understanding.  Flu Vaccine status: Up to date  Pneumococcal vaccine status: Up to date  Covid-19 vaccine status: Completed vaccines  Qualifies for Shingles Vaccine? Yes   Zostavax completed Yes   Shingrix Completed?: Yes  Screening Tests Health Maintenance  Topic Date Due   COVID-19 Vaccine (5 - Booster for Pfizer series) 04/30/2021   INFLUENZA VACCINE  06/11/2022   MAMMOGRAM  10/08/2022   TETANUS/TDAP  11/08/2031   Pneumonia Vaccine 37+ Years old  Completed   DEXA SCAN  Completed   Hepatitis C Screening  Completed   Zoster Vaccines- Shingrix  Completed   HPV VACCINES  Aged Out   PAP SMEAR-Modifier  Discontinued   COLONOSCOPY (Pts 45-71yr Insurance coverage will need to be confirmed)  Discontinued    Health Maintenance  Health Maintenance Due  Topic Date Due   COVID-19  Vaccine (5 - Booster for PStanwoodseries) 04/30/2021    Colorectal cancer screening: No longer required.   Mammogram status: Completed 10/08/2021. Repeat every year  Bone Density status: Completed 06/17/2018.   Lung Cancer Screening: (Low Dose CT Chest recommended if Age 77-80years, 30 pack-year currently smoking OR have quit w/in 15years.) does not qualify.   Lung Cancer Screening Referral: no  Additional Screening:  Hepatitis C Screening: does qualify; Completed 05/07/2017  Vision Screening: Recommended annual ophthalmology exams for early detection of glaucoma and other disorders of the eye. Is the patient up to date with their annual eye exam?  No  Who is the provider or what is the name of the office in which the patient attends annual eye exams? none If pt is not established with a provider, would they like to be referred to a provider to establish care? No .   Dental Screening: Recommended annual dental exams for proper oral hygiene  Community Resource Referral / Chronic Care Management: CRR required this visit?  No   CCM required this visit?  No      Plan:     I have personally reviewed and noted the following in the patient's chart:   Medical and social history Use of alcohol, tobacco or illicit drugs  Current medications and supplements including opioid prescriptions.  Functional ability and status Nutritional status Physical activity Advanced directives List of other physicians Hospitalizations, surgeries, and ER visits in previous 12 months Vitals Screenings to include cognitive, depression, and falls Referrals and appointments  In addition, I have reviewed and discussed with patient certain preventive protocols, quality metrics, and best practice recommendations. A written personalized care plan for preventive services as well as general preventive health recommendations were provided to patient.     Kellie Simmering, LPN   1/49/7026   Nurse Notes:  none  Due to this being a virtual visit, the after visit summary with patients personalized plan was offered to patient via mail or my-chart. Patient would like to access on my-chart

## 2022-04-01 ENCOUNTER — Other Ambulatory Visit: Payer: Self-pay | Admitting: Cardiovascular Disease

## 2022-04-09 ENCOUNTER — Other Ambulatory Visit: Payer: Self-pay | Admitting: Cardiovascular Disease

## 2022-04-10 ENCOUNTER — Other Ambulatory Visit: Payer: Self-pay | Admitting: Family Medicine

## 2022-04-10 ENCOUNTER — Ambulatory Visit (INDEPENDENT_AMBULATORY_CARE_PROVIDER_SITE_OTHER): Payer: Medicare HMO | Admitting: Family Medicine

## 2022-04-10 ENCOUNTER — Encounter: Payer: Self-pay | Admitting: Family Medicine

## 2022-04-10 ENCOUNTER — Ambulatory Visit (INDEPENDENT_AMBULATORY_CARE_PROVIDER_SITE_OTHER)
Admission: RE | Admit: 2022-04-10 | Discharge: 2022-04-10 | Disposition: A | Discharge status: Home / Self Care | Payer: Medicare HMO | Source: Ambulatory Visit | Attending: Family Medicine | Admitting: Family Medicine

## 2022-04-10 VITALS — BP 136/80 | HR 68 | Temp 97.6°F | Ht 60.0 in | Wt 207.2 lb

## 2022-04-10 DIAGNOSIS — T462X5S Adverse effect of other antidysrhythmic drugs, sequela: Secondary | ICD-10-CM

## 2022-04-10 DIAGNOSIS — I10 Essential (primary) hypertension: Secondary | ICD-10-CM | POA: Diagnosis not present

## 2022-04-10 DIAGNOSIS — E038 Other specified hypothyroidism: Secondary | ICD-10-CM

## 2022-04-10 DIAGNOSIS — L989 Disorder of the skin and subcutaneous tissue, unspecified: Secondary | ICD-10-CM | POA: Diagnosis not present

## 2022-04-10 DIAGNOSIS — I4891 Unspecified atrial fibrillation: Secondary | ICD-10-CM | POA: Diagnosis not present

## 2022-04-10 DIAGNOSIS — M7989 Other specified soft tissue disorders: Secondary | ICD-10-CM | POA: Diagnosis not present

## 2022-04-10 DIAGNOSIS — I4892 Unspecified atrial flutter: Secondary | ICD-10-CM | POA: Diagnosis not present

## 2022-04-10 DIAGNOSIS — M25562 Pain in left knee: Secondary | ICD-10-CM | POA: Diagnosis not present

## 2022-04-10 DIAGNOSIS — R0602 Shortness of breath: Secondary | ICD-10-CM | POA: Diagnosis not present

## 2022-04-10 DIAGNOSIS — T462X5A Adverse effect of other antidysrhythmic drugs, initial encounter: Secondary | ICD-10-CM | POA: Insufficient documentation

## 2022-04-10 LAB — T4, FREE: Free T4: 1.17 ng/dL (ref 0.60–1.60)

## 2022-04-10 LAB — TSH: TSH: 2.03 u[IU]/mL (ref 0.35–5.50)

## 2022-04-10 NOTE — Assessment & Plan Note (Signed)
Pt req cxr and thyroid labs for cardiology

## 2022-04-10 NOTE — Assessment & Plan Note (Signed)
On amiodarone Pt req thyroid labs and cxr  Under care of cardiology

## 2022-04-10 NOTE — Progress Notes (Signed)
Subjective:    Patient ID: Kerry Simpson, female    DOB: 03-05-1946, 76 y.o.   MRN: [id number]  HPI Pt presents with L leg complaint  Also needs cxr/thyroid lab from cardiology recommendation  Wt Readings from Last 3 Encounters:  04/10/22 207 lb 4 oz (94 kg)  03/28/22 202 lb (91.6 kg)  03/15/22 203 lb (92.1 kg)   40.48 kg/m  Past visit for L knee/leg pain  Saw ortho  Had neg [place] for DVT Noted a rubbery lump posteriorly  Known deg change in knee  Per pt orthopedics could not figure out what the lump was  She notes that L leg does swell at times    Had a cortisone shot after that -medial knee It helped after about 10 days  The lump is still there  Not bigger  Does not hurt    Now pain is coming back  A little in lateral knee  Worse after sitting   She has a skin lesion on R med knee for years  ? If getting bigger  Does not have a dermatologist      Saw Dr Gollan/cardiology on 5/5 for a fib  Wanted TSH in light of intermittent a fib/flutter No symptoms at the time  Noted chronic doe  Chronic amiodarone-wanted to get cxr for that   Lab Results  Component Value Date   TSH 1.54 06/28/2020  Note past h/o hypothy  Past Graves disease   bp is stable today  No cp or palpitations or headaches or edema  No side effects to medicines  BP Readings from Last 3 Encounters:  04/10/22 136/80  03/15/22 136/70  07/04/21 124/62    Pulse Readings from Last 3 Encounters:  04/10/22 68  03/15/22 72  07/04/21 82    Would like to see [facility] derm at [place]   Patient Active Problem List   Diagnosis Date Noted   Adverse effect of amiodarone 04/10/2022   Skin lesion of left leg 04/10/2022   Mass of soft tissue of lower leg 04/10/2022   Left knee pain 07/04/2021   Prediabetes 05/23/2019   Parotiditis 11/27/2018   Iron deficiency anemia 05/23/2018   Screening mammogram, encounter for 05/20/2018   (HFpEF) heart failure with preserved ejection fraction ([place])  05/19/2017   GERD (gastroesophageal reflux disease) 08/20/2016   DOE (dyspnea on exertion) 05/28/2016   Sleep disturbance 05/28/2016   Somnolence, daytime 05/09/2016   Encounter for anticoagulation discussion and counseling 02/15/2016   Atrial fibrillation and flutter ([place]) 02/14/2016   Routine general medical examination at a health care facility 10/13/2015   Estrogen deficiency 10/13/2015   Fatigue 08/25/2015   History of melena 12/12/2014   Gallstone 12/07/2014   Encounter for routine gynecological examination 04/14/2013   Encounter for Medicare annual wellness exam 03/30/2013   STRESS REACTION, ACUTE, WITH EMOTIONAL DISTURBANCE 11/26/2010   Osteopenia 06/21/2009   Subclinical hypothyroidism 04/26/2008   Morbid obesity ([place]) 04/26/2008   Essential hypertension 04/26/2008   Past Medical History:  Diagnosis Date   Anemia    iron def   Atrial fibrillation ([place])    Dr Rockey Situ   Cataract 05/13/2018   both eyes per last vision exam    Complication of anesthesia    "quit breathing" -Endoscopy( 12-22-14)   Difficult intubation    Fibrocystic breast    Fracture of right shoulder    Gallstones    Grave's disease    Graves disease    History of kidney stones  not a problem at present   Hypertension    Obesity    Osteoporosis    osteopenia   Past Surgical History:  Procedure Laterality Date   COLONOSCOPY  09/29/2014   Normal, Delfin Edis, MD   ELECTROPHYSIOLOGIC STUDY N/A 04/12/2016   Procedure: CARDIOVERSION;  Surgeon: Minna Merritts, MD;  Location: [facility] ORS;  Service: Cardiovascular;  Laterality: N/A;   ESOPHAGOGASTRODUODENOSCOPY (EGD) WITH PROPOFOL N/A 09/08/2018   Procedure: ESOPHAGOGASTRODUODENOSCOPY (EGD) WITH PROPOFOL;  Surgeon: Lollie Sails, MD;  Location: [facility] ENDOSCOPY;  Service: Endoscopy;  Laterality: N/A;   ESOPHAGOGASTRODUODENOSCOPY ENDOSCOPY     EUS N/A 01/12/2015   Procedure: UPPER ENDOSCOPIC ULTRASOUND (EUS) LINEAR;  Surgeon: Milus Banister, MD;   Location: [facility] ENDOSCOPY;  Service: Endoscopy;  Laterality: N/A;   TUBAL LIGATION     Social History   Tobacco Use   Smoking status: Never   Smokeless tobacco: Never  Vaping Use   Vaping Use: Never used  Substance Use Topics   Alcohol use: No    Alcohol/week: 0.0 standard drinks   Drug use: No   Family History  Problem Relation Age of Onset   Hypertension Mother    Diabetes Father    Hypertension Father    Hypertension Brother    Cancer Brother        prostate   Heart disease Brother        with bypass   Colon cancer Neg Hx    Breast cancer Neg Hx    No Known Allergies Current Outpatient Medications on File Prior to Visit  Medication Sig Dispense Refill   acetaminophen (TYLENOL) 500 MG tablet Take 1,000 mg by mouth daily as needed.      amiodarone (PACERONE) 200 MG tablet TAKE 1 TABLET BY MOUTH ONCE A DAY 90 tablet 3   amLODipine (NORVASC) 5 MG tablet TAKE 1 TABLET BY MOUTH ONCE A DAY 90 tablet 3   Cholecalciferol (VITAMIN D3 PO) Take 400 Units by mouth daily.     Cyanocobalamin (VITAMIN B 12 PO) Take by mouth daily.     furosemide (LASIX) 20 MG tablet Take 1 tablet (20 mg total) by mouth daily as needed. 90 tablet 3   Multiple Vitamin (MULTIVITAMIN) capsule Take 1 capsule by mouth daily.     omeprazole (PRILOSEC) 20 MG capsule TAKE 1 CAPSULE BY MOUTH ONCE DAILY 90 capsule 1   PARoxetine (PAXIL) 20 MG tablet Take 1 tablet (20 mg total) by mouth daily. 90 tablet 3   rivaroxaban (XARELTO) 20 MG TABS tablet TAKE 1 TABLET BY MOUTH ONCE A DAY WITH SUPPER 90 tablet 0   No current facility-administered medications on file prior to visit.     Review of Systems  Constitutional:  Negative for activity change, appetite change, fatigue, fever and unexpected weight change.  HENT:  Negative for congestion, ear pain, rhinorrhea, sinus pressure and sore throat.   Eyes:  Negative for pain, redness and visual disturbance.  Respiratory:  Negative for cough, shortness of breath and  wheezing.   Cardiovascular:  Negative for chest pain and palpitations.  Gastrointestinal:  Negative for abdominal pain, blood in stool, constipation and diarrhea.  Endocrine: Negative for polydipsia and polyuria.  Genitourinary:  Negative for dysuria, frequency and urgency.  Musculoskeletal:  Positive for arthralgias and gait problem. Negative for back pain, joint swelling and myalgias.  Skin:  Negative for pallor and rash.  Allergic/Immunologic: Negative for environmental allergies.  Neurological:  Negative for dizziness, syncope and headaches.  Hematological:  Negative for adenopathy. Does not bruise/bleed easily.  Psychiatric/Behavioral:  Negative for decreased concentration and dysphoric mood. The patient is not nervous/anxious.       Objective:   Physical Exam Constitutional:      General: She is not in acute distress.    Appearance: Normal appearance. She is obese. She is not ill-appearing or diaphoretic.  Eyes:     Conjunctiva/sclera: Conjunctivae normal.     Pupils: Pupils are equal, round, and reactive to light.  Cardiovascular:     Rate and Rhythm: Normal rate.     Pulses: Normal pulses.     Heart sounds: Normal heart sounds.  Pulmonary:     Effort: Pulmonary effort is normal. No respiratory distress.     Breath sounds: Normal breath sounds. No wheezing or rales.  Musculoskeletal:     Cervical back: Neck supple. No tenderness.     Comments: Knee left  No swelling or effusion  No warmth to the touch  mild crepitus  ROM: full today Flex nl Ext nl  [place]:no pain  Bounce test :no pain   Stability: Anterior drawer nl Lachman exam nl  Tenderness mild medial joint line  Gait nl  Fatty mass/growth just above knee on med thigh on L  Non tender [place]  No change from last visit       Lymphadenopathy:     Cervical: No cervical adenopathy.  Skin:    Coloration: Skin is not jaundiced.     Findings: No erythema or rash.  Neurological:     Mental Status:  She is alert.     Motor: No weakness.     Coordination: Coordination normal.     Deep Tendon Reflexes: Reflexes normal.  Psychiatric:        Mood and Affect: Mood normal.          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 If lipoma  Pt is planning derm visit  Declines surgical referral        Mass of soft tissue of lower leg    Fatty /,mobile and nt No change from last visit Suspect lipoma  Pt declines surgical ref Planning derm visit        Relevant Orders   Ambulatory referral to Dermatology

## 2022-04-10 NOTE — Assessment & Plan Note (Signed)
Unsure what dx is  Sent for ortho records/[facility]   Suspect OA One inj did help   Doubt the fatty growth near med knee worsens pain  Interested in ? If lipoma  Pt is planning derm visit  Declines surgical referral

## 2022-04-10 NOTE — Assessment & Plan Note (Signed)
Fatty /,mobile and nt No change from last visit Suspect lipoma   Pt declines surgical ref Planning derm visit

## 2022-04-10 NOTE — Assessment & Plan Note (Signed)
On amiodarone Past Graves No clinical changes Has a fib   TSH and FT4 drawn

## 2022-04-10 NOTE — Patient Instructions (Addendum)
I want to send for your records from [facility]  Question: how bad is your arthritis   Get back to orthopedics if your knee starts hurting   I will work on a referral to dermatology  If you don't get a call in 1-2 weeks  I would like them to look at the lesion/mole on your leg   Lab today  Chest xray today

## 2022-04-10 NOTE — Assessment & Plan Note (Signed)
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

## 2022-04-17 DIAGNOSIS — H2513 Age-related nuclear cataract, bilateral: Secondary | ICD-10-CM | POA: Diagnosis not present

## 2022-05-15 ENCOUNTER — Other Ambulatory Visit: Payer: Self-pay | Admitting: Cardiovascular Disease

## 2022-05-15 NOTE — Telephone Encounter (Signed)
Refill request

## 2022-05-15 NOTE — Telephone Encounter (Signed)
Prescription refill request for Xarelto received.  Indication: Atrial Fib Last office visit: 03/15/22  Johnny Bridge MD Weight: 92.1kg Age: 76 Scr: 1.03 on 07/20/21 CrCl: 67.56  Based on above findings Xarelto '20mg'$  daily is the appropriate dose.  Refill approved.

## 2022-06-17 ENCOUNTER — Other Ambulatory Visit: Payer: Self-pay | Admitting: Family Medicine

## 2022-06-19 ENCOUNTER — Other Ambulatory Visit: Payer: Self-pay | Admitting: Family Medicine

## 2022-06-19 NOTE — Telephone Encounter (Signed)
Last filled 03/23/22 Last ov 04/10/22

## 2022-07-08 DIAGNOSIS — D485 Neoplasm of uncertain behavior of skin: Secondary | ICD-10-CM | POA: Diagnosis not present

## 2022-07-08 DIAGNOSIS — L821 Other seborrheic keratosis: Secondary | ICD-10-CM | POA: Diagnosis not present

## 2022-07-08 DIAGNOSIS — D2372 Other benign neoplasm of skin of left lower limb, including hip: Secondary | ICD-10-CM | POA: Diagnosis not present

## 2022-07-08 DIAGNOSIS — I872 Venous insufficiency (chronic) (peripheral): Secondary | ICD-10-CM | POA: Diagnosis not present

## 2022-07-25 DIAGNOSIS — H2513 Age-related nuclear cataract, bilateral: Secondary | ICD-10-CM | POA: Diagnosis not present

## 2022-07-26 ENCOUNTER — Other Ambulatory Visit: Payer: Self-pay | Admitting: Family Medicine

## 2022-08-05 ENCOUNTER — Encounter: Payer: Self-pay | Admitting: Ophthalmology

## 2022-08-06 DIAGNOSIS — H2511 Age-related nuclear cataract, right eye: Secondary | ICD-10-CM | POA: Diagnosis not present

## 2022-08-16 NOTE — Discharge Instructions (Signed)

## 2022-08-23 ENCOUNTER — Encounter: Payer: Self-pay | Admitting: Family Medicine

## 2022-08-23 DIAGNOSIS — J209 Acute bronchitis, unspecified: Secondary | ICD-10-CM | POA: Diagnosis not present

## 2022-08-23 NOTE — Telephone Encounter (Signed)
Left voicemail for patient to call back and schedule an appointment.

## 2022-08-26 ENCOUNTER — Encounter: Payer: Self-pay | Admitting: Ophthalmology

## 2022-08-30 NOTE — Anesthesia Preprocedure Evaluation (Signed)
Anesthesia Evaluation  Patient identified by MRN, date of birth, ID band Patient awake    Reviewed: Allergy & Precautions, NPO status , Patient's Chart, lab work & pertinent test results  History of Anesthesia Complications (+) DIFFICULT AIRWAY and history of anesthetic complications ("quit breathing" -Endoscopy( 12-22-14))  Airway Mallampati: III  TM Distance: >3 FB Neck ROM: full    Dental  (+) Chipped   Pulmonary neg pulmonary ROS,    Pulmonary exam normal        Cardiovascular hypertension, +CHF (HFpEF) and + DOE  + dysrhythmias Atrial Fibrillation      Neuro/Psych negative neurological ROS  negative psych ROS   GI/Hepatic Neg liver ROS, GERD  ,  Endo/Other  Hypothyroidism Morbid obesity  Renal/GU negative Renal ROS  negative genitourinary   Musculoskeletal   Abdominal   Peds  Hematology negative hematology ROS (+)   Anesthesia Other Findings Past Medical History: No date: Anemia     Comment:  iron def No date: Atrial fibrillation (HCC)     Comment:  Dr Rockey Situ 05/13/2018: Cataract     Comment:  both eyes per last vision exam  No date: Complication of anesthesia     Comment:  "quit breathing" -Endoscopy( 12-22-14) No date: Difficult intubation     Comment:  Pt reports unaware of difficulty No date: Fibrocystic breast No date: Fracture of right shoulder No date: Gallstones No date: Grave's disease No date: Graves disease No date: Hypertension No date: Obesity No date: Osteoporosis     Comment:  osteopenia  Past Surgical History: 09/29/2014: COLONOSCOPY     Comment:  Normal, Delfin Edis, MD 04/12/2016: ELECTROPHYSIOLOGIC STUDY; N/A     Comment:  Procedure: CARDIOVERSION;  Surgeon: Minna Merritts,               MD;  Location: [facility];  Service: Cardiovascular;                Laterality: N/A; 09/08/2018: ESOPHAGOGASTRODUODENOSCOPY (EGD) WITH PROPOFOL; N/A     Comment:  Procedure:  ESOPHAGOGASTRODUODENOSCOPY (EGD) WITH               PROPOFOL;  Surgeon: Lollie Sails, MD;  Location:               [facility] ENDOSCOPY;  Service: Endoscopy;  Laterality: N/A; No date: ESOPHAGOGASTRODUODENOSCOPY ENDOSCOPY 01/12/2015: EUS; N/A     Comment:  Procedure: UPPER ENDOSCOPIC ULTRASOUND (EUS) LINEAR;                Surgeon: Milus Banister, MD;  Location: [facility];                Service: Endoscopy;  Laterality: N/A; No date: TUBAL LIGATION  BMI    Body Mass Index: 39.68 kg/m      Reproductive/Obstetrics negative OB ROS                             Anesthesia Physical Anesthesia Plan  ASA: 3  Anesthesia Plan: MAC   Post-op Pain Management:    Induction:   PONV Risk Score and Plan:   Airway Management Planned: Natural Airway and Nasal Cannula  Additional Equipment:   Intra-op Plan:   Post-operative Plan:   Informed Consent:   Plan Discussed with: Anesthesiologist, CRNA and Surgeon  Anesthesia Plan Comments:         Anesthesia Quick Evaluation

## 2022-09-02 ENCOUNTER — Ambulatory Visit: Payer: Medicare HMO | Admitting: Anesthesiology

## 2022-09-02 ENCOUNTER — Encounter: Payer: Self-pay | Admitting: Ophthalmology

## 2022-09-02 ENCOUNTER — Ambulatory Visit
Admission: RE | Admit: 2022-09-02 | Discharge: 2022-09-02 | Disposition: A | Discharge status: Home / Self Care | Payer: Medicare HMO | Source: Ambulatory Visit | Attending: Ophthalmology | Admitting: Ophthalmology

## 2022-09-02 ENCOUNTER — Other Ambulatory Visit: Payer: Self-pay

## 2022-09-02 ENCOUNTER — Encounter
Admission: RE | Disposition: A | Discharge status: Home / Self Care | Payer: Self-pay | Source: Ambulatory Visit | Attending: Ophthalmology

## 2022-09-02 DIAGNOSIS — E669 Obesity, unspecified: Secondary | ICD-10-CM | POA: Insufficient documentation

## 2022-09-02 DIAGNOSIS — I4891 Unspecified atrial fibrillation: Secondary | ICD-10-CM | POA: Diagnosis not present

## 2022-09-02 DIAGNOSIS — I110 Hypertensive heart disease with heart failure: Secondary | ICD-10-CM | POA: Diagnosis not present

## 2022-09-02 DIAGNOSIS — K219 Gastro-esophageal reflux disease without esophagitis: Secondary | ICD-10-CM | POA: Insufficient documentation

## 2022-09-02 DIAGNOSIS — Z6839 Body mass index (BMI) 39.0-39.9, adult: Secondary | ICD-10-CM | POA: Insufficient documentation

## 2022-09-02 DIAGNOSIS — I5032 Chronic diastolic (congestive) heart failure: Secondary | ICD-10-CM | POA: Diagnosis not present

## 2022-09-02 DIAGNOSIS — H2511 Age-related nuclear cataract, right eye: Secondary | ICD-10-CM | POA: Insufficient documentation

## 2022-09-02 DIAGNOSIS — I509 Heart failure, unspecified: Secondary | ICD-10-CM | POA: Diagnosis not present

## 2022-09-02 DIAGNOSIS — E039 Hypothyroidism, unspecified: Secondary | ICD-10-CM | POA: Insufficient documentation

## 2022-09-02 HISTORY — PX: CATARACT EXTRACTION W/PHACO: SHX586

## 2022-09-02 SURGERY — PHACOEMULSIFICATION, CATARACT, WITH IOL INSERTION
Anesthesia: Monitor Anesthesia Care | Site: Eye | Laterality: Right

## 2022-09-02 MED ORDER — MOXIFLOXACIN HCL 0.5 % OP SOLN
OPHTHALMIC | Status: DC | PRN
  Administered 2022-09-02: 0.2 mL via OPHTHALMIC

## 2022-09-02 MED ORDER — SIGHTPATH DOSE#1 BSS IO SOLN
INTRAOCULAR | Status: DC | PRN
  Administered 2022-09-02: 15 mL

## 2022-09-02 MED ORDER — FENTANYL CITRATE (PF) 100 MCG/2ML IJ SOLN
INTRAMUSCULAR | Status: DC | PRN
  Administered 2022-09-02: 100 ug via INTRAVENOUS

## 2022-09-02 MED ORDER — LIDOCAINE HCL (PF) 2 % IJ SOLN
INTRAOCULAR | Status: DC | PRN
  Administered 2022-09-02: 1 mL via INTRAOCULAR

## 2022-09-02 MED ORDER — MIDAZOLAM HCL 2 MG/2ML IJ SOLN
INTRAMUSCULAR | Status: DC | PRN
  Administered 2022-09-02: 2 mg via INTRAVENOUS

## 2022-09-02 MED ORDER — SIGHTPATH DOSE#1 BSS IO SOLN
INTRAOCULAR | Status: DC | PRN
  Administered 2022-09-02: 78 mL via OPHTHALMIC

## 2022-09-02 MED ORDER — ARMC OPHTHALMIC DILATING DROPS
1.0000 | OPHTHALMIC | Status: DC | PRN
  Administered 2022-09-02 (×3): 1 via OPHTHALMIC

## 2022-09-02 MED ORDER — TETRACAINE HCL 0.5 % OP SOLN
1.0000 [drp] | OPHTHALMIC | Status: DC | PRN
  Administered 2022-09-02 (×3): 1 [drp] via OPHTHALMIC

## 2022-09-02 MED ORDER — SIGHTPATH DOSE#1 NA HYALUR & NA CHOND-NA HYALUR IO KIT
PACK | INTRAOCULAR | Status: DC | PRN
  Administered 2022-09-02: 1 via OPHTHALMIC

## 2022-09-02 SURGICAL SUPPLY — 19 items
CANNULA ANT/CHMB 27G (MISCELLANEOUS) IMPLANT
CANNULA ANT/CHMB 27GA (MISCELLANEOUS) IMPLANT
CATARACT SUITE SIGHTPATH (MISCELLANEOUS) ×1 IMPLANT
DISSECTOR HYDRO NUCLEUS 50X22 (MISCELLANEOUS) ×1 IMPLANT
FEE CATARACT SUITE SIGHTPATH (MISCELLANEOUS) ×1 IMPLANT
GLOVE SURG GAMMEX PI TX LF 7.5 (GLOVE) ×1 IMPLANT
GLOVE SURG SYN 8.5  E (GLOVE) ×1
GLOVE SURG SYN 8.5 E (GLOVE) ×1 IMPLANT
GLOVE SURG SYN 8.5 PF PI (GLOVE) ×1 IMPLANT
LENS IOL TECNIS EYHANCE 23.0 (Intraocular Lens) IMPLANT
NDL FILTER BLUNT 18X1 1/2 (NEEDLE) ×1 IMPLANT
NEEDLE FILTER BLUNT 18X1 1/2 (NEEDLE) ×1 IMPLANT
PACK VIT ANT 23G (MISCELLANEOUS) IMPLANT
RING MALYGIN (MISCELLANEOUS) IMPLANT
SUT ETHILON 10-0 CS-B-6CS-B-6 (SUTURE)
SUTURE EHLN 10-0 CS-B-6CS-B-6 (SUTURE) IMPLANT
SYR 3ML LL SCALE MARK (SYRINGE) ×1 IMPLANT
SYR 5ML LL (SYRINGE) ×1 IMPLANT
WATER STERILE IRR 250ML POUR (IV SOLUTION) ×1 IMPLANT

## 2022-09-02 NOTE — Anesthesia Postprocedure Evaluation (Signed)
Anesthesia Post Note  Patient: Kerry Simpson  Procedure(s) Performed: CATARACT EXTRACTION PHACO AND INTRAOCULAR LENS PLACEMENT (IOC) RIGHT (Right: Eye)  Patient location during evaluation: PACU Anesthesia Type: MAC Level of consciousness: awake and alert Pain management: pain level controlled Vital Signs Assessment: post-procedure vital signs reviewed and stable Respiratory status: spontaneous breathing, nonlabored ventilation and respiratory function stable Cardiovascular status: blood pressure returned to baseline and stable Postop Assessment: no apparent nausea or vomiting Anesthetic complications: no   No notable events documented.   Last Vitals:  Vitals:   09/02/22 1104 09/02/22 1109  BP: (!) 142/62 (!) 144/61  Pulse: 64 63  Resp: 14 18  Temp: (!) 36.3 C (!) 36.3 C  SpO2: 95% 96%    Last Pain:  Vitals:   09/02/22 1109  TempSrc:   PainSc: 0-No pain                 Iran Ouch

## 2022-09-02 NOTE — H&P (Signed)
[facility]   Primary Care Physician:  Tower, Wynelle Fanny, MD Ophthalmologist: Dr. Benay Pillow  Pre-Procedure History & Physical: HPI:  Kerry Simpson is a 76 y.o. female here for cataract surgery.   Past Medical History:  Diagnosis Date   Anemia    iron def   Atrial fibrillation ([place])    Dr Rockey Situ   Cataract 05/13/2018   both eyes per last vision exam    Complication of anesthesia    "quit breathing" -Endoscopy( 12-22-14)   Difficult intubation    Pt reports unaware of difficulty   Fibrocystic breast    Fracture of right shoulder    Gallstones    Grave's disease    Graves disease    Hypertension    Obesity    Osteoporosis    osteopenia    Past Surgical History:  Procedure Laterality Date   COLONOSCOPY  09/29/2014   Normal, Delfin Edis, MD   ELECTROPHYSIOLOGIC STUDY N/A 04/12/2016   Procedure: CARDIOVERSION;  Surgeon: Minna Merritts, MD;  Location: [facility] OR[facility];  Service: Cardiovascular;  Laterality: N/A;   ESOPHAGOGASTRODUODENOSCOPY (EGD) WITH PROPOFOL N/A 09/08/2018   Procedure: ESOPHAGOGASTRODUODENOSCOPY (EGD) WITH PROPOFOL;  Surgeon: Lollie Sails, MD;  Location: [facility];  Service: Endoscopy;  Laterality: N/A;   ESOPHAGOGASTRODUODENOSCOPY ENDOSCOPY     EUS N/A 01/12/2015   Procedure: UPPER ENDOSCOPIC ULTRASOUND (EUS) LINEAR;  Surgeon: Milus Banister, MD;  Location: [facility];  Service: Endoscopy;  Laterality: N/A;   TUBAL LIGATION      Prior to Admission medications   Medication Sig Start Date End Date Taking? Authorizing Provider  acetaminophen (TYLENOL) 500 MG tablet Take 1,000 mg by mouth daily as needed.    Yes [provider]  amiodarone (PACERONE) 200 MG tablet TAKE 1 TABLET BY MOUTH ONCE A DAY 04/01/22  Yes Gollan, Kathlene November, MD  amLODipine (NORVASC) 5 MG tablet TAKE 1 TABLET BY MOUTH ONCE A DAY 04/09/22  Yes Gollan, Kathlene November, MD  fosinopril (MONOPRIL) 20 MG tablet TAKE 1 TABLET BY MOUTH ONCE DAILY 06/17/22  Yes Tower, Marne A, MD   furosemide (LASIX) 20 MG tablet Take 1 tablet (20 mg total) by mouth daily as needed. 03/06/21  Yes Gollan, Kathlene November, MD  melatonin 5 MG TABS Take 10 mg by mouth at bedtime as needed.   Yes [provider]  Misc Natural Products (YUMVS BEET ROOT-TART CHERRY PO) Take 1 tablet by mouth daily.   Yes [provider]  Multiple Vitamin (MULTIVITAMIN) capsule Take 1 capsule by mouth daily.   Yes [provider]  omeprazole (PRILOSEC) 20 MG capsule TAKE 1 CAPSULE BY MOUTH ONCE DAILY 07/26/22  Yes Tower, Wynelle Fanny, MD  PARoxetine (PAXIL) 20 MG tablet TAKE 1 TABLET BY MOUTH ONCE A DAY 06/19/22  Yes Tower, Wynelle Fanny, MD  rivaroxaban (XARELTO) 20 MG TABS tablet Take 1 tablet (20 mg total) by mouth daily with supper. 05/15/22  Yes Minna Merritts, MD  Zinc 50 MG TABS Take 1 tablet by mouth daily.   Yes [provider]    Allergies as of 07/29/2022   (No Known Allergies)    Family History  Problem Relation Age of Onset   Hypertension Mother    Diabetes Father    Hypertension Father    Hypertension Brother    Cancer Brother        prostate   Heart disease Brother        with bypass   Colon cancer Neg  Hx    Breast cancer Neg Hx     Social History   Socioeconomic History   Marital status: Married    Spouse name: Not on file   Number of children: Not on file   Years of education: Not on file   Highest education level: Not on file  Occupational History   Occupation: Retired    Fish farm manager: RETIRED  Tobacco Use   Smoking status: Never   Smokeless tobacco: Never  Vaping Use   Vaping Use: Never used  Substance and Sexual Activity   Alcohol use: No    Alcohol/week: 0.0 standard drinks of alcohol   Drug use: No   Sexual activity: Not Currently  Other Topics Concern   Not on file  Social History Narrative   Not on file   Social Determinants of Health   Financial Resource Strain: Low Risk  (03/28/2022)   Overall Financial Resource Strain (CARDIA)     Difficulty of Paying Living Expenses: Not hard at all  Food Insecurity: No Food Insecurity (03/28/2022)   Hunger Vital Sign    Worried About Running Out of Food in the Last Year: Never true    [place] in the Last Year: Never true  Transportation Needs: No Transportation Needs (03/28/2022)   PRAPARE - Hydrologist (Medical): No    Lack of Transportation (Non-Medical): No  Physical Activity: Inactive (03/28/2022)   Exercise Vital Sign    Days of Exercise per Week: 0 days    Minutes of Exercise per Session: 0 min  Stress: No Stress Concern Present (03/28/2022)   [place]    Feeling of Stress : Not at all  Social Connections: Not on file  Intimate Partner Violence: Not on file    Review of Systems: See HPI, otherwise negative ROS  Physical Exam: BP (!) 179/67   Pulse 72   Temp (!) 97.2 F (36.2 C) (Temporal)   Resp 15   Ht '5\' 1"'$  (1.549 m)   Wt 93.4 kg   SpO2 96%   BMI 38.92 kg/m  General:   Alert, cooperative in NAD Head:  Normocephalic and atraumatic. Respiratory:  Normal work of breathing. Cardiovascular:  RRR  Impression/Plan: Kerry Simpson is here for cataract surgery.  Risks, benefits, limitations, and alternatives regarding cataract surgery have been reviewed with the patient.  Questions have been answered.  All parties agreeable.   Benay Pillow, MD  09/02/2022, 10:35 AM

## 2022-09-02 NOTE — Op Note (Signed)
OPERATIVE NOTE  TYKEISHA PEER [id number] 09/02/2022   PREOPERATIVE DIAGNOSIS:  Nuclear sclerotic cataract right eye.  H25.11   POSTOPERATIVE DIAGNOSIS:    Nuclear sclerotic cataract right eye.     PROCEDURE:  Phacoemusification with posterior chamber intraocular lens placement of the right eye   LENS:   Implant Name Type Inv. Item Serial No. Manufacturer Lot No. LRB No. Used Action  LENS IOL TECNIS EYHANCE 23.0 - [id number] Intraocular Lens LENS IOL TECNIS EYHANCE 23.0 [id number] [employer]  Right 1 Implanted       Procedure(s) with comments: CATARACT EXTRACTION PHACO AND INTRAOCULAR LENS PLACEMENT (IOC) RIGHT (Right) - 8.31 0:49.5   SURGEON:  Benay Pillow, MD, MPH  ANESTHESIOLOGIST: Anesthesiologist: [place] Ouch, MD CRNA: Tobie Poet, CRNA   ANESTHESIA:  Topical with tetracaine drops augmented with 1% preservative-free intracameral lidocaine.  ESTIMATED BLOOD LOSS: less than 1 mL.   COMPLICATIONS:  None.   DESCRIPTION OF PROCEDURE:  The patient was identified in the holding room and transported to the operating room and placed in the supine position under the operating microscope.  The right eye was identified as the operative eye and it was prepped and draped in the usual sterile ophthalmic fashion.   A 1.0 millimeter clear-corneal paracentesis was made at the 10:30 position. 0.5 ml of preservative-free 1% lidocaine with epinephrine was injected into the anterior chamber.  The anterior chamber was filled with viscoelastic.  A 2.4 millimeter keratome was used to make a near-clear corneal incision at the 8:00 position.  A curvilinear capsulorrhexis was made with a cystotome and capsulorrhexis forceps.  Balanced salt solution was used to hydrodissect and hydrodelineate the nucleus.   Phacoemulsification was then used in stop and chop fashion to remove the lens nucleus and epinucleus.  The remaining cortex was then removed using the irrigation and aspiration  handpiece. Viscoelastic was then placed into the capsular bag to distend it for lens placement.  A lens was then injected into the capsular bag.  The remaining viscoelastic was aspirated.   Wounds were hydrated with balanced salt solution.  The anterior chamber was inflated to a physiologic pressure with balanced salt solution.   Intracameral vigamox 0.1 mL undiluted was injected into the eye and a drop placed onto the ocular surface.  No wound leaks were noted.  The patient was taken to the recovery room in stable condition without complications of anesthesia or surgery  Benay Pillow 09/02/2022, 11:02 AM

## 2022-09-02 NOTE — Transfer of Care (Signed)
Immediate Anesthesia Transfer of Care Note  Patient: Kerry Simpson  Procedure(s) Performed: CATARACT EXTRACTION PHACO AND INTRAOCULAR LENS PLACEMENT (IOC) RIGHT (Right: Eye)  Patient Location: PACU  Anesthesia Type: MAC  Level of Consciousness: awake, alert  and patient cooperative  Airway and Oxygen Therapy: Patient Spontanous Breathing and Patient connected to supplemental oxygen  Post-op Assessment: Post-op Vital signs reviewed, Patient's Cardiovascular Status Stable, Respiratory Function Stable, Patent Airway and No signs of Nausea or vomiting  Post-op Vital Signs: Reviewed and stable  Complications: No notable events documented.

## 2022-09-03 ENCOUNTER — Encounter: Payer: Self-pay | Admitting: Ophthalmology

## 2022-09-10 ENCOUNTER — Encounter: Payer: Self-pay | Admitting: Ophthalmology

## 2022-09-12 NOTE — Discharge Instructions (Signed)

## 2022-09-16 ENCOUNTER — Encounter: Payer: Self-pay | Admitting: Ophthalmology

## 2022-09-16 ENCOUNTER — Encounter
Admission: RE | Disposition: A | Discharge status: Home / Self Care | Payer: Self-pay | Source: Home / Self Care | Attending: Ophthalmology

## 2022-09-16 ENCOUNTER — Other Ambulatory Visit: Payer: Self-pay

## 2022-09-16 ENCOUNTER — Ambulatory Visit
Admission: RE | Admit: 2022-09-16 | Discharge: 2022-09-16 | Disposition: A | Discharge status: Home / Self Care | Payer: Medicare HMO | Attending: Ophthalmology | Admitting: Ophthalmology

## 2022-09-16 ENCOUNTER — Ambulatory Visit: Payer: Medicare HMO | Admitting: Anesthesiology

## 2022-09-16 DIAGNOSIS — K219 Gastro-esophageal reflux disease without esophagitis: Secondary | ICD-10-CM | POA: Diagnosis not present

## 2022-09-16 DIAGNOSIS — I110 Hypertensive heart disease with heart failure: Secondary | ICD-10-CM | POA: Insufficient documentation

## 2022-09-16 DIAGNOSIS — I5032 Chronic diastolic (congestive) heart failure: Secondary | ICD-10-CM | POA: Insufficient documentation

## 2022-09-16 DIAGNOSIS — H2512 Age-related nuclear cataract, left eye: Secondary | ICD-10-CM | POA: Diagnosis not present

## 2022-09-16 DIAGNOSIS — I4891 Unspecified atrial fibrillation: Secondary | ICD-10-CM | POA: Diagnosis not present

## 2022-09-16 DIAGNOSIS — I509 Heart failure, unspecified: Secondary | ICD-10-CM | POA: Diagnosis not present

## 2022-09-16 DIAGNOSIS — E039 Hypothyroidism, unspecified: Secondary | ICD-10-CM | POA: Diagnosis not present

## 2022-09-16 DIAGNOSIS — Z6838 Body mass index (BMI) 38.0-38.9, adult: Secondary | ICD-10-CM | POA: Insufficient documentation

## 2022-09-16 HISTORY — PX: CATARACT EXTRACTION W/PHACO: SHX586

## 2022-09-16 SURGERY — PHACOEMULSIFICATION, CATARACT, WITH IOL INSERTION
Anesthesia: Monitor Anesthesia Care | Site: Eye | Laterality: Left

## 2022-09-16 MED ORDER — FENTANYL CITRATE (PF) 100 MCG/2ML IJ SOLN
INTRAMUSCULAR | Status: DC | PRN
  Administered 2022-09-16: 50 ug via INTRAVENOUS

## 2022-09-16 MED ORDER — SIGHTPATH DOSE#1 BSS IO SOLN
INTRAOCULAR | Status: DC | PRN
  Administered 2022-09-16: 116 mL via OPHTHALMIC

## 2022-09-16 MED ORDER — TETRACAINE HCL 0.5 % OP SOLN
1.0000 [drp] | OPHTHALMIC | Status: DC | PRN
  Administered 2022-09-16 (×3): 1 [drp] via OPHTHALMIC

## 2022-09-16 MED ORDER — MOXIFLOXACIN HCL 0.5 % OP SOLN
OPHTHALMIC | Status: DC | PRN
  Administered 2022-09-16: .2 mL via OPHTHALMIC

## 2022-09-16 MED ORDER — MIDAZOLAM HCL 2 MG/2ML IJ SOLN
INTRAMUSCULAR | Status: DC | PRN
  Administered 2022-09-16: 1 mg via INTRAVENOUS

## 2022-09-16 MED ORDER — SIGHTPATH DOSE#1 BSS IO SOLN
INTRAOCULAR | Status: DC | PRN
  Administered 2022-09-16: 15 mL via INTRAOCULAR

## 2022-09-16 MED ORDER — ARMC OPHTHALMIC DILATING DROPS
1.0000 | OPHTHALMIC | Status: DC | PRN
  Administered 2022-09-16 (×3): 1 via OPHTHALMIC

## 2022-09-16 MED ORDER — LIDOCAINE HCL (PF) 2 % IJ SOLN
INTRAOCULAR | Status: DC | PRN
  Administered 2022-09-16: 1 mL via INTRAOCULAR

## 2022-09-16 MED ORDER — SIGHTPATH DOSE#1 NA HYALUR & NA CHOND-NA HYALUR IO KIT
PACK | INTRAOCULAR | Status: DC | PRN
  Administered 2022-09-16: 1 via OPHTHALMIC

## 2022-09-16 MED ORDER — LACTATED RINGERS IV SOLN: INTRAVENOUS | Status: DC

## 2022-09-16 SURGICAL SUPPLY — 13 items
CATARACT SUITE SIGHTPATH (MISCELLANEOUS) ×1 IMPLANT
DISSECTOR HYDRO NUCLEUS 50X22 (MISCELLANEOUS) ×1 IMPLANT
FEE CATARACT SUITE SIGHTPATH (MISCELLANEOUS) ×1 IMPLANT
GLOVE SURG GAMMEX PI TX LF 7.5 (GLOVE) ×1 IMPLANT
GLOVE SURG SYN 8.5  E (GLOVE) ×1
GLOVE SURG SYN 8.5 E (GLOVE) ×1 IMPLANT
GLOVE SURG SYN 8.5 PF PI (GLOVE) ×1 IMPLANT
LENS IOL TECNIS EYHANCE 22.5 (Intraocular Lens) IMPLANT
NDL FILTER BLUNT 18X1 1/2 (NEEDLE) ×1 IMPLANT
NEEDLE FILTER BLUNT 18X1 1/2 (NEEDLE) ×1 IMPLANT
SYR 3ML LL SCALE MARK (SYRINGE) ×1 IMPLANT
SYR 5ML LL (SYRINGE) ×1 IMPLANT
WATER STERILE IRR 250ML POUR (IV SOLUTION) ×1 IMPLANT

## 2022-09-16 NOTE — Anesthesia Postprocedure Evaluation (Signed)
Anesthesia Post Note  Patient: Kerry Simpson  Procedure(s) Performed: CATARACT EXTRACTION PHACO AND INTRAOCULAR LENS PLACEMENT (IOC) LEFT (Left: Eye)  Patient location during evaluation: PACU Anesthesia Type: MAC Level of consciousness: awake and alert Pain management: pain level controlled Vital Signs Assessment: post-procedure vital signs reviewed and stable Respiratory status: spontaneous breathing, nonlabored ventilation, respiratory function stable and patient connected to nasal cannula oxygen Cardiovascular status: stable and blood pressure returned to baseline Postop Assessment: no apparent nausea or vomiting Anesthetic complications: no  No notable events documented.   Last Vitals:  Vitals:   09/16/22 0918 09/16/22 0922  BP: (!) 163/69 (!) 154/101  Pulse: 64 62  Resp: 14 18  Temp: (!) 36.2 C (!) 36.2 C  SpO2: 94% 95%    Last Pain:  Vitals:   09/16/22 0922  TempSrc:   PainSc: 0-No pain                 Dimas Millin

## 2022-09-16 NOTE — Transfer of Care (Signed)
Immediate Anesthesia Transfer of Care Note  Patient: Kerry Simpson  Procedure(s) Performed: CATARACT EXTRACTION PHACO AND INTRAOCULAR LENS PLACEMENT (IOC) LEFT (Left: Eye)  Patient Location: PACU  Anesthesia Type: MAC  Level of Consciousness: awake, alert  and patient cooperative  Airway and Oxygen Therapy: Patient Spontanous Breathing and Patient connected to supplemental oxygen  Post-op Assessment: Post-op Vital signs reviewed, Patient's Cardiovascular Status Stable, Respiratory Function Stable, Patent Airway and No signs of Nausea or vomiting  Post-op Vital Signs: Reviewed and stable  Complications: No notable events documented.

## 2022-09-16 NOTE — H&P (Signed)
[facility]   Primary Care Physician:  Tower, Wynelle Fanny, MD Ophthalmologist: Dr. Benay Pillow  Pre-Procedure History & Physical: HPI:  Kerry Simpson is a 76 y.o. female here for cataract surgery.   Past Medical History:  Diagnosis Date   Anemia    iron def   Atrial fibrillation ([place])    Dr Rockey Situ   Cataract 05/13/2018   both eyes per last vision exam    Complication of anesthesia    "quit breathing" -Endoscopy( 12-22-14)   Difficult intubation    Pt reports unaware of difficulty   Fibrocystic breast    Fracture of right shoulder    Gallstones    Grave's disease    Graves disease    Hypertension    Obesity    Osteoporosis    osteopenia    Past Surgical History:  Procedure Laterality Date   CATARACT EXTRACTION W/PHACO Right 09/02/2022   Procedure: CATARACT EXTRACTION PHACO AND INTRAOCULAR LENS PLACEMENT ([place]) RIGHT;  Surgeon: Eulogio Bear, MD;  Location: [place];  Service: Ophthalmology;  Laterality: Right;  8.31 0:49.5   COLONOSCOPY  09/29/2014   Normal, Delfin Edis, MD   ELECTROPHYSIOLOGIC STUDY N/A 04/12/2016   Procedure: CARDIOVERSION;  Surgeon: Minna Merritts, MD;  Location: [facility] ORS;  Service: Cardiovascular;  Laterality: N/A;   ESOPHAGOGASTRODUODENOSCOPY (EGD) WITH PROPOFOL N/A 09/08/2018   Procedure: ESOPHAGOGASTRODUODENOSCOPY (EGD) WITH PROPOFOL;  Surgeon: Lollie Sails, MD;  Location: [facility] ENDOSCOPY;  Service: Endoscopy;  Laterality: N/A;   ESOPHAGOGASTRODUODENOSCOPY ENDOSCOPY     EUS N/A 01/12/2015   Procedure: UPPER ENDOSCOPIC ULTRASOUND (EUS) LINEAR;  Surgeon: Milus Banister, MD;  Location: [facility] ENDOSCOPY;  Service: Endoscopy;  Laterality: N/A;   TUBAL LIGATION      Prior to Admission medications   Medication Sig Start Date End Date Taking? Authorizing Provider  acetaminophen (TYLENOL) 500 MG tablet Take 1,000 mg by mouth daily as needed.    Yes [provider]  amiodarone (PACERONE) 200 MG tablet TAKE 1 TABLET BY MOUTH  ONCE A DAY 04/01/22  Yes Gollan, Kathlene November, MD  amLODipine (NORVASC) 5 MG tablet TAKE 1 TABLET BY MOUTH ONCE A DAY 04/09/22  Yes Gollan, Kathlene November, MD  fosinopril (MONOPRIL) 20 MG tablet TAKE 1 TABLET BY MOUTH ONCE DAILY 06/17/22  Yes Tower, Marne A, MD  furosemide (LASIX) 20 MG tablet Take 1 tablet (20 mg total) by mouth daily as needed. 03/06/21  Yes Gollan, Kathlene November, MD  melatonin 5 MG TABS Take 10 mg by mouth at bedtime as needed.   Yes [provider]  Misc Natural Products (YUMVS BEET ROOT-TART CHERRY PO) Take 1 tablet by mouth daily.   Yes [provider]  Multiple Vitamin (MULTIVITAMIN) capsule Take 1 capsule by mouth daily.   Yes [provider]  omeprazole (PRILOSEC) 20 MG capsule TAKE 1 CAPSULE BY MOUTH ONCE DAILY 07/26/22  Yes Tower, Wynelle Fanny, MD  PARoxetine (PAXIL) 20 MG tablet TAKE 1 TABLET BY MOUTH ONCE A DAY 06/19/22  Yes Tower, Wynelle Fanny, MD  rivaroxaban (XARELTO) 20 MG TABS tablet Take 1 tablet (20 mg total) by mouth daily with supper. 05/15/22  Yes Minna Merritts, MD  Zinc 50 MG TABS Take 1 tablet by mouth daily.   Yes [provider]    Allergies as of 07/29/2022   (No Known Allergies)    Family History  Problem Relation Age of Onset   Hypertension Mother    Diabetes Father  Hypertension Father    Hypertension Brother    Cancer Brother        prostate   Heart disease Brother        with bypass   Colon cancer Neg Hx    Breast cancer Neg Hx     Social History   Socioeconomic History   Marital status: Married    Spouse name: Not on file   Number of children: Not on file   Years of education: Not on file   Highest education level: Not on file  Occupational History   Occupation: Retired    Fish farm manager: RETIRED  Tobacco Use   Smoking status: Never   Smokeless tobacco: Never  Vaping Use   Vaping Use: Never used  Substance and Sexual Activity   Alcohol use: No    Alcohol/week: 0.0 standard drinks of alcohol   Drug use: No    Sexual activity: Not Currently  Other Topics Concern   Not on file  Social History Narrative   Not on file   Social Determinants of Health   Financial Resource Strain: Low Risk  (03/28/2022)   Overall Financial Resource Strain (CARDIA)    Difficulty of Paying Living Expenses: Not hard at all  Food Insecurity: No Food Insecurity (03/28/2022)   Hunger Vital Sign    Worried About Running Out of Food in the Last Year: Never true    [place] in the Last Year: Never true  Transportation Needs: No Transportation Needs (03/28/2022)   PRAPARE - Hydrologist (Medical): No    Lack of Transportation (Non-Medical): No  Physical Activity: Inactive (03/28/2022)   Exercise Vital Sign    Days of Exercise per Week: 0 days    Minutes of Exercise per Session: 0 min  Stress: No Stress Concern Present (03/28/2022)   [place]    Feeling of Stress : Not at all  Social Connections: Not on file  Intimate Partner Violence: Not on file    Review of Systems: See HPI, otherwise negative ROS  Physical Exam: BP (!) 165/75   Pulse 65   Temp (!) 97.3 F (36.3 C) (Temporal)   Resp 18   Ht '5\' 1"'$  (1.549 m)   Wt 93.4 kg   SpO2 97%   BMI 38.92 kg/m  General:   Alert, cooperative in NAD Head:  Normocephalic and atraumatic. Respiratory:  Normal work of breathing. Cardiovascular:  RRR  Impression/Plan: Kerry Simpson is here for cataract surgery.  Risks, benefits, limitations, and alternatives regarding cataract surgery have been reviewed with the patient.  Questions have been answered.  All parties agreeable.   Benay Pillow, MD  09/16/2022, 8:48 AM

## 2022-09-16 NOTE — Anesthesia Preprocedure Evaluation (Addendum)
Anesthesia Evaluation  Patient identified by MRN, date of birth, ID band Patient awake    Reviewed: Allergy & Precautions, NPO status , Patient's Chart, lab work & pertinent test results  History of Anesthesia Complications (+) DIFFICULT AIRWAY and history of anesthetic complications ("quit breathing" -Endoscopy( 12-22-14))  Airway Mallampati: III  TM Distance: >3 FB Neck ROM: full    Dental  (+) Chipped   Pulmonary neg pulmonary ROS   Pulmonary exam normal        Cardiovascular Exercise Tolerance: Good hypertension, +CHF (HFpEF) and + DOE  + dysrhythmias Atrial Fibrillation  Rhythm:Regular Rate:Normal     Neuro/Psych negative neurological ROS  negative psych ROS   GI/Hepatic Neg liver ROS,GERD  ,,  Endo/Other  Hypothyroidism  Morbid obesity  Renal/GU negative Renal ROS  negative genitourinary   Musculoskeletal   Abdominal  (+) + obese  Peds  Hematology negative hematology ROS (+)   Anesthesia Other Findings Past Medical History: No date: Anemia     Comment:  iron def No date: Atrial fibrillation (HCC)     Comment:  Dr Rockey Situ 05/13/2018: Cataract     Comment:  both eyes per last vision exam  No date: Complication of anesthesia     Comment:  "quit breathing" -Endoscopy( 12-22-14) No date: Difficult intubation     Comment:  Pt reports unaware of difficulty No date: Fibrocystic breast No date: Fracture of right shoulder No date: Gallstones No date: Grave's disease No date: Graves disease No date: Hypertension No date: Obesity No date: Osteoporosis     Comment:  osteopenia  Past Surgical History: 09/29/2014: COLONOSCOPY     Comment:  Normal, Delfin Edis, MD 04/12/2016: ELECTROPHYSIOLOGIC STUDY; N/A     Comment:  Procedure: CARDIOVERSION;  Surgeon: Minna Merritts,               MD;  Location: [facility] ORS;  Service: Cardiovascular;                Laterality: N/A; 09/08/2018: ESOPHAGOGASTRODUODENOSCOPY  (EGD) WITH PROPOFOL; N/A     Comment:  Procedure: ESOPHAGOGASTRODUODENOSCOPY (EGD) WITH               PROPOFOL;  Surgeon: Lollie Sails, MD;  Location:               [facility] ENDOSCOPY;  Service: Endoscopy;  Laterality: N/A; No date: ESOPHAGOGASTRODUODENOSCOPY ENDOSCOPY 01/12/2015: EUS; N/A     Comment:  Procedure: UPPER ENDOSCOPIC ULTRASOUND (EUS) LINEAR;                Surgeon: Milus Banister, MD;  Location: [facility] END[facility];                Service: Endoscopy;  Laterality: N/A; No date: TUBAL LIGATION  BMI    Body Mass Index: 39.68 kg/m      Reproductive/Obstetrics negative OB ROS                             Anesthesia Physical Anesthesia Plan  ASA: 3  Anesthesia Plan: MAC   Post-op Pain Management:    Induction: Intravenous  PONV Risk Score and Plan: 2  Airway Management Planned: Natural Airway and Nasal Cannula  Additional Equipment:   Intra-op Plan:   Post-operative Plan:   Informed Consent: I have reviewed the patients History and Physical, chart, labs and discussed the procedure including the risks, benefits and alternatives for the proposed anesthesia with the patient or authorized  representative who has indicated his/her understanding and acceptance.     Dental Advisory Given  Plan Discussed with: Anesthesiologist, CRNA and Surgeon  Anesthesia Plan Comments: (Patient consented for risks of anesthesia including but not limited to:  - adverse reactions to medications - damage to eyes, teeth, lips or other oral mucosa - nerve damage due to positioning  - sore throat or hoarseness - Damage to heart, brain, nerves, lungs, other parts of body or loss of life  Patient voiced understanding.)        Anesthesia Quick Evaluation

## 2022-09-16 NOTE — Op Note (Signed)
OPERATIVE NOTE  Kerry Simpson [id number] 09/16/2022   PREOPERATIVE DIAGNOSIS:  Nuclear sclerotic cataract left eye.  H25.12   POSTOPERATIVE DIAGNOSIS:    Nuclear sclerotic cataract left eye.     PROCEDURE:  Phacoemusification with posterior chamber intraocular lens placement of the left eye   LENS:   Implant Name Type Inv. Item Serial No. Manufacturer Lot No. LRB No. Used Action  LENS IOL TECNIS EYHANCE 22.5 - [id number] Intraocular Lens LENS IOL TECNIS EYHANCE 22.5 [id number] [employer]  Left 1 Implanted      Procedure(s) with comments: CATARACT EXTRACTION PHACO AND INTRAOCULAR LENS PLACEMENT (IOC) LEFT (Left) - 13.18 1:10.5  DIB00 +22.5   SURGEON:  Benay Pillow, MD, MPH   ANESTHESIA:  Topical with tetracaine drops augmented with 1% preservative-free intracameral lidocaine.  ESTIMATED BLOOD LOSS: <1 mL   COMPLICATIONS:  None.   DESCRIPTION OF PROCEDURE:  The patient was identified in the holding room and transported to the operating room and placed in the supine position under the operating microscope.  The left eye was identified as the operative eye and it was prepped and draped in the usual sterile ophthalmic fashion.   A 1.0 millimeter clear-corneal paracentesis was made at the 5:00 position. 0.5 ml of preservative-free 1% lidocaine with epinephrine was injected into the anterior chamber.  The anterior chamber was filled with viscoelastic.  A 2.4 millimeter keratome was used to make a near-clear corneal incision at the 2:00 position.  A curvilinear capsulorrhexis was made with a cystotome and capsulorrhexis forceps.  Balanced salt solution was used to hydrodissect and hydrodelineate the nucleus.   Phacoemulsification was then used in stop and chop fashion to remove the lens nucleus and epinucleus.  The remaining cortex was then removed using the irrigation and aspiration handpiece. Viscoelastic was then placed into the capsular bag to distend it for lens placement.  A lens  was then injected into the capsular bag.  The remaining viscoelastic was aspirated.   Wounds were hydrated with balanced salt solution.  The anterior chamber was inflated to a physiologic pressure with balanced salt solution.  Intracameral vigamox 0.1 mL undiltued was injected into the eye and a drop placed onto the ocular surface.  No wound leaks were noted.  The patient was taken to the recovery room in stable condition without complications of anesthesia or surgery  Benay Pillow 09/16/2022, 9:17 AM

## 2022-09-17 ENCOUNTER — Encounter: Payer: Self-pay | Admitting: Ophthalmology

## 2022-10-03 ENCOUNTER — Encounter: Payer: Self-pay | Admitting: Cardiovascular Disease

## 2022-10-07 ENCOUNTER — Telehealth: Payer: Self-pay | Admitting: *Deleted

## 2022-10-07 NOTE — Telephone Encounter (Signed)
The patient sent the my chart message below on 10/03/22. I called and spoke with her today to see how she was doing.  Per the patient, she was walking from the car to the house on Thanksgiving day and she was SOB. O2 sats were 88%. HR was 100-111 bpm.  She states she attempted to call the on-call provider but never got a call back.   She advised that her symptoms lasted ~ 12 hours, then resolved. She is doing fine today.  The patient has a history of atrial flutter and is on Xarelto.  I have advised her to continue to monitor symptoms. If she is noticing that these are lasting longer or occurring more often to please call us back so we can follow up with her in the office.  The patient voices understanding and is agreeable.

## 2022-10-07 NOTE — Telephone Encounter (Signed)
Kerry Simpson, Kerry Simpson  [facility] [place] Triage Phone Number: [phone]   Have experienced shortness of breath when trying to walk from car to house today.  Also, oxygen level has been low, and some swelling in fingers and feet.  Heart rate ranging from 100-111.  Would like to get an appointment asap.

## 2022-10-09 ENCOUNTER — Encounter: Payer: Self-pay | Admitting: Cardiovascular Disease

## 2022-10-09 DIAGNOSIS — Z0100 Encounter for examination of eyes and vision without abnormal findings: Secondary | ICD-10-CM | POA: Diagnosis not present

## 2022-10-10 ENCOUNTER — Other Ambulatory Visit
Admission: RE | Admit: 2022-10-10 | Discharge: 2022-10-10 | Disposition: A | Discharge status: Home / Self Care | Payer: Medicare HMO | Attending: Physician Assistant | Admitting: Physician Assistant

## 2022-10-10 ENCOUNTER — Encounter: Payer: Self-pay | Admitting: Physician Assistant

## 2022-10-10 ENCOUNTER — Other Ambulatory Visit: Payer: Self-pay | Admitting: Cardiology

## 2022-10-10 ENCOUNTER — Ambulatory Visit: Payer: Medicare HMO | Attending: Physician Assistant | Admitting: Cardiology

## 2022-10-10 ENCOUNTER — Telehealth: Payer: Self-pay

## 2022-10-10 ENCOUNTER — Other Ambulatory Visit: Payer: Self-pay | Admitting: Physician Assistant

## 2022-10-10 VITALS — BP 112/72 | HR 113 | Ht 61.0 in | Wt 211.8 lb

## 2022-10-10 DIAGNOSIS — I4892 Unspecified atrial flutter: Secondary | ICD-10-CM | POA: Insufficient documentation

## 2022-10-10 DIAGNOSIS — I5030 Unspecified diastolic (congestive) heart failure: Secondary | ICD-10-CM | POA: Insufficient documentation

## 2022-10-10 DIAGNOSIS — Z5181 Encounter for therapeutic drug level monitoring: Secondary | ICD-10-CM | POA: Diagnosis not present

## 2022-10-10 DIAGNOSIS — I4891 Unspecified atrial fibrillation: Secondary | ICD-10-CM | POA: Insufficient documentation

## 2022-10-10 LAB — COMPREHENSIVE METABOLIC PANEL
ALT: 16 U/L (ref 0–44)
AST: 21 U/L (ref 15–41)
Albumin: 4.1 g/dL (ref 3.5–5.0)
Alkaline Phosphatase: 90 U/L (ref 38–126)
Anion gap: 8 (ref 5–15)
BUN: 16 mg/dL (ref 8–23)
CO2: 23 mmol/L (ref 22–32)
Calcium: 8.9 mg/dL (ref 8.9–10.3)
Chloride: 109 mmol/L (ref 98–111)
Creatinine, Ser: 1.02 mg/dL — ABNORMAL HIGH (ref 0.44–1.00)
GFR, Estimated: 57 mL/min — ABNORMAL LOW (ref >60)
Glucose, Bld: 107 mg/dL — ABNORMAL HIGH (ref 70–99)
Potassium: 4.3 mmol/L (ref 3.5–5.1)
Sodium: 140 mmol/L (ref 135–145)
Total Bilirubin: 0.4 mg/dL (ref 0.3–1.2)
Total Protein: 7 g/dL (ref 6.5–8.1)

## 2022-10-10 LAB — CBC
HCT: 40.9 % (ref 36.0–46.0)
Hemoglobin: 12.7 g/dL (ref 12.0–15.0)
MCH: 26.3 pg (ref 26.0–34.0)
MCHC: 31.1 g/dL (ref 30.0–36.0)
MCV: 84.9 fL (ref 80.0–100.0)
Platelets: 303 10*3/uL (ref 150–400)
RBC: 4.82 MIL/uL (ref 3.87–5.11)
RDW: 15.3 % (ref 11.5–15.5)
WBC: 8.1 10*3/uL (ref 4.0–10.5)
nRBC: 0 % (ref 0.0–0.2)

## 2022-10-10 LAB — TSH: TSH: 1.634 u[IU]/mL (ref 0.350–4.500)

## 2022-10-10 MED ORDER — AMIODARONE HCL 200 MG PO TABS: 200.0000 mg | ORAL_TABLET | ORAL | 3 refills | Status: DC

## 2022-10-10 MED ORDER — FUROSEMIDE 20 MG PO TABS: 20.0000 mg | ORAL_TABLET | ORAL | 3 refills | Status: DC

## 2022-10-10 NOTE — Patient Instructions (Addendum)
Medication Instructions:  Your physician has recommended you make the following change in your medication:   TAKE Furosemide (Lasix) 20 mg for 3 days then use as needed. TAKE Amiodarone 200 mg twice a day for 7 days then go back to 200 mg once a day  *If you need a refill on your cardiac medications before your next appointment, please call your pharmacy*   Lab Work: CMET, CBC, TSH to be done today over at the [facility] and check in at registration desk.   If you have labs (blood work) drawn today and your tests are completely normal, you will receive your results only by: [place] (if you have MyChart) OR A paper copy in the mail If you have any lab test that is abnormal or we need to change your treatment, we will call you to review the results.   Testing/Procedures: Your physician has requested that you have an echocardiogram. Echocardiography is a painless test that uses sound waves to create images of your heart. It provides your doctor with information about the size and shape of your heart and how well your heart's chambers and valves are working. This procedure takes approximately one hour. There are no restrictions for this procedure. Please do NOT wear cologne, perfume, aftershave, or lotions (deodorant is allowed). Please arrive 15 minutes prior to your appointment time.    You are scheduled for a Cardioversion on 10/17/22 with Dr. Rockey Situ Please arrive at the [facility] at 06:30 a.m. on the day of your procedure.  DIET INSTRUCTIONS:  Nothing to eat or drink after midnight except your medications with a small sip of water.       Labs: CBC, CMET, TSH to be done over at the [facility] entrance and stop at registration.   Medications:  HOLD Furosemide the morning of your procedure. YOU MAY TAKE ALL of your remaining medications with a small amount of water.  Must have a responsible person to drive you home.  Bring a current list of your  medications and current insurance cards.    If you have any questions after you get home, please call the office at [phone]    Follow-Up: At [facility], you and your health needs are our priority.  As part of our continuing mission to provide you with exceptional heart care, we have created designated Provider Care Teams.  These Care Teams include your primary Cardiologist (physician) and Advanced Practice Providers (APPs -  Physician Assistants and Nurse Practitioners) who all work together to provide you with the care you need, when you need it.   Your next appointment:   4 week(s)  The format for your next appointment:   In Person  Provider:   Ida Rogue, MD or Christell Faith, PA-C       Important Information About Sugar

## 2022-10-10 NOTE — Progress Notes (Signed)
Cardiology Office Note    Date:  10/10/2022   ID:  Kerry, Simpson 1946/08/27, MRN [id number]  PCP:  Kerry Greenspan, MD  Cardiologist:  None  Electrophysiologist:  None   Chief Complaint: heart racing   History of Present Illness:   Kerry Simpson is a 76 y.o. female with history of paroxysmal atrial fibrillation (on amiodarone and xarelto), HTN, GERD, obesity.  Started on amio 03/2016: CHADS-VASc score is greater than or equal to 3 (hypertension, age, gender)   Seen by Dr. [name] 04/2016:  Tachy/brady digoxin held sleep disordered breathing and evidence of HFpEF.  Does not think she has OSA Cancelled sleep study Previous cardioversion cancelled, she converted on her own on amio   Echocardiogram showing normal LV function, mildly elevated right heart pressures  She presents today with complaints of her heart racing and feeling more SOB than usual. Symptoms were first noticed on Thanksgiving as she was leaving her relatives and walking to her car. She was unable to speak in complete sentences after this exertion. Since then, she has been checking her heart rate on her SPO2 monitor and noticed her HR has been from 60's - low 110's. She can feel her heart racing during these periods and notices that she feels more SOB than usual. Her weight is up 8 lbs since her last visit, and she advised that her lasix that is PRN, she has taken "2-3 times over the last week", which is abnormal for her. She denies CP, wheezing, orthopnea, dizziness, presyncope, syncope, changes in sleeping pattern or snoring.  She has been compliant with her xarelto and amiodarone and has not missed any doses. She denies any other changes to her diet or lifestyle, but does endorse that over Thanksgiving she consumed more than she probably should have. She also endorses increased stress r/t a close friends personal issues.   Labwork independently reviewed: 07/20/21 Cr 1.03, eGFR 56, hgb 14.2, HCT 43.2, AST 14, ALT  15 04/10/22 TSH 2.03   Cardiology Studies:   Studies reviewed are outlined and summarized above. Reports included below if pertinent.   Echo 02/2016 - Left ventricle: The cavity size was normal. Systolic function was    normal. The estimated ejection fraction was in the range of 60%    to 65%. Wall motion was normal; there were no regional wall    motion abnormalities. The study is not technically sufficient to    allow evaluation of LV diastolic function.  - Mitral valve: There was mild regurgitation.  - Left atrium: The atrium was mildly dilated.  - Right ventricle: Systolic function was normal.  - Pulmonary arteries: Systolic pressure was mildly elevated. PA    peak pressure: 38 mm Hg (S).      Past Medical History:  Diagnosis Date   Anemia    iron def   Atrial fibrillation ([place])    Dr Rockey Situ   Cataract 05/13/2018   both eyes per last vision exam    Complication of anesthesia    "quit breathing" -Endoscopy( 12-22-14)   Difficult intubation    Pt reports unaware of difficulty   Fibrocystic breast    Fracture of right shoulder    Gallstones    Grave's disease    Graves disease    Hypertension    Obesity    Osteoporosis    osteopenia    Past Surgical History:  Procedure Laterality Date   CATARACT EXTRACTION W/PHACO Right 09/02/2022   Procedure: CATARACT EXTRACTION PHACO  AND INTRAOCULAR LENS PLACEMENT (IOC) RIGHT;  Surgeon: Eulogio Bear, MD;  Location: [facility];  Service: Ophthalmology;  Laterality: Right;  8.31 0:49.5   CATARACT EXTRACTION W/PHACO Left 09/16/2022   Procedure: CATARACT EXTRACTION PHACO AND INTRAOCULAR LENS PLACEMENT ([place]) LEFT;  Surgeon: Eulogio Bear, MD;  Location: [facility];  Service: Ophthalmology;  Laterality: Left;  13.18 1:10.5   COLONOSCOPY  09/29/2014   Normal, Delfin Edis, MD   ELECTROPHYSIOLOGIC STUDY N/A 04/12/2016   Procedure: CARDIOVERSION;  Surgeon: Minna Merritts, MD;  Location: [facility];  Service:  Cardiovascular;  Laterality: N/A;   ESOPHAGOGASTRODUODENOSCOPY (EGD) WITH PROPOFOL N/A 09/08/2018   Procedure: ESOPHAGOGASTRODUODENOSCOPY (EGD) WITH PROPOFOL;  Surgeon: Lollie Sails, MD;  Location[facility] ENDOSCOPY;  Service: Endoscopy;  Laterality: N/A;   ESOPHAGOGASTRODUODENOSCOPY ENDOSCOPY     EUS N/A 01/12/2015   Procedure: UPPER ENDOSCOPIC ULTRASOUND (EUS) LINEAR;  Surgeon: Milus Banister, MD;  Location: [facility] ENDOSCOPY;  Service: Endoscopy;  Laterality: N/A;   TUBAL LIGATION      Current Medications: Current Meds  Medication Sig   acetaminophen (TYLENOL) 500 MG tablet Take 1,000 mg by mouth daily as needed.    amLODipine (NORVASC) 5 MG tablet TAKE 1 TABLET BY MOUTH ONCE A DAY   fosinopril (MONOPRIL) 20 MG tablet TAKE 1 TABLET BY MOUTH ONCE DAILY   furosemide (LASIX) 20 MG tablet Take 1 tablet (20 mg total) by mouth as directed. Take 1 tablet (20 mg) once a day for 3 days then take as needed.   melatonin 5 MG TABS Take 10 mg by mouth at bedtime as needed.   Misc Natural Products (YUMVS BEET ROOT-TART CHERRY PO) Take 1 tablet by mouth daily.   Multiple Vitamin (MULTIVITAMIN) capsule Take 1 capsule by mouth daily.   omeprazole (PRILOSEC) 20 MG capsule TAKE 1 CAPSULE BY MOUTH ONCE DAILY   PARoxetine (PAXIL) 20 MG tablet TAKE 1 TABLET BY MOUTH ONCE A DAY   rivaroxaban (XARELTO) 20 MG TABS tablet Take 1 tablet (20 mg total) by mouth daily with supper.   Zinc 50 MG TABS Take 1 tablet by mouth daily.   [DISCONTINUED] amiodarone (PACERONE) 200 MG tablet TAKE 1 TABLET BY MOUTH ONCE A DAY   [DISCONTINUED] furosemide (LASIX) 20 MG tablet Take 1 tablet (20 mg total) by mouth daily as needed.      Allergies:   Patient has no known allergies.   Social History   Socioeconomic History   Marital status: Married    Spouse name: Not on file   Number of children: Not on file   Years of education: Not on file   Highest education level: Not on file  Occupational History   Occupation: Retired     Fish farm manager: RETIRED  Tobacco Use   Smoking status: Never   Smokeless tobacco: Never  Vaping Use   Vaping Use: Never used  Substance and Sexual Activity   Alcohol use: No    Alcohol/week: 0.0 standard drinks of alcohol   Drug use: No   Sexual activity: Not Currently  Other Topics Concern   Not on file  Social History Narrative   Not on file   Social Determinants of Health   Financial Resource Strain: Low Risk  (03/28/2022)   Overall Financial Resource Strain (CARDIA)    Difficulty of Paying Living Expenses: Not hard at all  Food Insecurity: No Food Insecurity (03/28/2022)   Hunger Vital Sign    Worried About Running Out of Food in the Last Year:  Never true    Ran Out of Food in the Last Year: Never true  Transportation Needs: No Transportation Needs (03/28/2022)   PRAPARE - Hydrologist (Medical): No    Lack of Transportation (Non-Medical): No  Physical Activity: Inactive (03/28/2022)   Exercise Vital Sign    Days of Exercise per Week: 0 days    Minutes of Exercise per Session: 0 min  Stress: No Stress Concern Present (03/28/2022)   [place]    Feeling of Stress : Not at all  Social Connections: Not on file     Family History:  The patient's family history includes Cancer in her brother; Diabetes in her father; Heart disease in her brother; Heart failure in her brother; Hypertension in her brother, father, and mother. There is no history of Colon cancer or Breast cancer.   ROS:   Review of Systems  Constitutional: Negative.   HENT: Negative.    Eyes: Negative.   Respiratory:  Positive for shortness of breath (when her heart is racing). Negative for cough and wheezing.   Cardiovascular:  Positive for palpitations and leg swelling (trace bilaterally). Negative for chest pain and orthopnea.  Gastrointestinal: Negative.   Genitourinary: Negative.   Musculoskeletal: Negative.    Skin: Negative.   Neurological:  Negative for dizziness and loss of consciousness.  Endo/Heme/Allergies:  Bruises/bleeds easily.  Psychiatric/Behavioral: Negative.        EKG(s)/Additional Labs   EKG:  EKG is ordered today, personally reviewed, demonstrating A Flutter 2:1 conduction, HR 113 bpm, v4, v5, v6 with poor R wave progression.   Recent Labs: 04/10/2022: TSH 2.03  Recent Lipid Panel    Component Value Date/Time   CHOL 141 06/28/2020 0821   TRIG 102.0 06/28/2020 0821   HDL 53.80 06/28/2020 0821   CHOLHDL 3 06/28/2020 0821   VLDL 20.4 06/28/2020 0821   LDLCALC 67 06/28/2020 0821    PHYSICAL EXAM:    Vitals:   10/10/22 1126  BP: 112/72  Pulse: (!) 113  Height: _0  (1.549 m)  Weight: 211 lb 12.8 oz (96.1 kg)  SpO2: 95%  BMI (Calculated): 40.04     GEN: Well nourished, well developed female in no acute distress HEENT: normocephalic, atraumatic Neck: no JVD, carotid bruits, or masses Cardiac: tachycardic; no murmurs, rubs, or gallops, mild trace pedal edema Respiratory:  clear to auscultation bilaterally, mildly increased work of breathing GI: soft, nontender, nondistended, + BS MS: no deformity or atrophy Skin: warm and dry, no rash Neuro:  Alert and Oriented x 3, Strength and sensation are intact, follows commands Psych: euthymic mood, full affect  Wt Readings from Last 3 Encounters:  10/10/22 211 lb 12.8 oz (96.1 kg)  09/16/22 206 lb (93.4 kg)  09/02/22 206 lb (93.4 kg)     ASSESSMENT & PLAN:   Atrial flutter/atrial fibrillation - has not missed any of her amiodarone or xarelto dosing. Will reload with amiodarone 200 mg BID x 7 days, then resume 200 mg QD. Will check CMET, CBC and TSH. Will plan for DCCV, explained risk/benefits. Previously scheduled for DCCV with Dr. Rockey Situ, but day of converted. She is instructed to continue to check her HR on her oximeter over the next few days and report to the office if her sensation of heart racing and SOB subside  and she notices that her HR is staying consistent. Depending on if AF persists after DCCV, will consider zio monitor in the future.  Hx of SSS, Beta blockers have been contraindicated.  HTN - well controlled today, 112/72. Continue current medication regimen, norvasc, fosinopril.  Volume overload - weight is up 8 lbs since her last visit, has taken her lasix 20 mg 3 times this week, normally only needs to take PRN. Does not routinely weigh herself. Lung sounds are clear and equal bilaterally, no JVD, trace pedal edema. Will schedule her lasix 20 mg PO x 3 days, then resume PRN. Instructed to watch salt intake, consume ~ 64 ounces of total fluid. Will schedule echo, last one 2017.    Shared Decision Making/Informed Consent{  The risks (stroke, cardiac arrhythmias rarely resulting in the need for a temporary or permanent pacemaker, skin irritation or burns and complications associated with conscious sedation including aspiration, arrhythmia, respiratory failure and death), benefits (restoration of normal sinus rhythm) and alternatives of a direct current cardioversion were explained in detail to Ms. Kerstetter and she agrees to proceed.       Disposition: F/u with Christell Faith PA in 4 weeks.    Medication Adjustments/Labs and Tests Ordered: Current medicines are reviewed at length with the patient today.  Concerns regarding medicines are outlined above. Medication changes, Labs and Tests ordered today are summarized above and listed in the Patient Instructions accessible in Encounters.   Signed, Trudi Ida, NP  10/10/2022 12:45 PM    [place] Phone: [phone]; Fax: [phone]

## 2022-10-10 NOTE — Progress Notes (Signed)
Orders for DCCV placed.

## 2022-10-10 NOTE — Telephone Encounter (Signed)
Called patient in response to her MyChart message below and scheduled her to see Christell Faith today at 10:55. Patient was grateful for the follow up.  [place] Triage (supporting Minna Merritts, MD)11 hours ago (8:56 PM)    Earlier in the week I received a phone call responding to the My Chart I had sent last week.  The nurse told me if I experienced these symptoms again to call and get an appointment.  Well, I did today.  Fluctuating heart rate, oxygen rate and irregular line on oximeter.  I would like an appointment as soon as possible.  Since I can't feel my afib I have to rely on what I experience.  It has not been all the time.  This is last episode since last Thursday.  You can reach me at [phone].  MyChart message from 11/27:  Per the patient, she was walking from the car to the house on Thanksgiving day and she was SOB. O2 sats were 88%. HR was 100-111 bpm.

## 2022-10-11 NOTE — Addendum Note (Signed)
Addended by: James Ivanoff D on: 10/11/2022 11:01 AM   Modules accepted: Orders

## 2022-10-14 ENCOUNTER — Encounter: Payer: Self-pay | Admitting: Cardiology

## 2022-10-15 ENCOUNTER — Telehealth: Payer: Self-pay | Admitting: Cardiovascular Disease

## 2022-10-15 NOTE — Telephone Encounter (Signed)
Patient stated she was supposed to have an EKG tomorrow (12/6) prior to surgery on 12/7 but no one called her back.  Patient would like to know next steps.

## 2022-10-16 ENCOUNTER — Encounter: Payer: Self-pay | Admitting: Cardiovascular Disease

## 2022-10-16 ENCOUNTER — Ambulatory Visit: Payer: Medicare HMO

## 2022-10-16 NOTE — Telephone Encounter (Signed)
Cancelled EKG & DCCV

## 2022-10-16 NOTE — Telephone Encounter (Signed)
See other encounters

## 2022-10-16 NOTE — Telephone Encounter (Signed)
Called patient with appointment time for EKG today prior to her DCCV tomorrow. She now wants to cancel the DCCV and the EKG today. She reports having too much to do right now and just feels like the 2 amiodarone a day is helping. She promises to monitor her heart rates closely and if she should get "in trouble" then she would proceed to the ED. She also sent long my chart message for provider to review. She then inquired where her doctor is because she would like to see him about all of this. Offered appointment with Dr. Rockey Situ next week and she was agreeable. Will update provider of her wishes and forward my chart messages as well. No further needs at this time.

## 2022-10-17 ENCOUNTER — Ambulatory Visit: Admission: RE | Admit: 2022-10-17 | Payer: Medicare HMO | Source: Ambulatory Visit | Admitting: Cardiovascular Disease

## 2022-10-17 ENCOUNTER — Encounter: Admission: RE | Payer: Self-pay | Source: Ambulatory Visit

## 2022-10-17 SURGERY — CARDIOVERSION
Anesthesia: General

## 2022-10-21 DIAGNOSIS — M1711 Unilateral primary osteoarthritis, right knee: Secondary | ICD-10-CM | POA: Diagnosis not present

## 2022-10-23 ENCOUNTER — Ambulatory Visit: Payer: Medicare HMO | Admitting: Cardiovascular Disease

## 2022-10-25 ENCOUNTER — Ambulatory Visit: Payer: Medicare HMO | Attending: Physician Assistant

## 2022-10-25 DIAGNOSIS — I4892 Unspecified atrial flutter: Secondary | ICD-10-CM

## 2022-10-25 DIAGNOSIS — I4891 Unspecified atrial fibrillation: Secondary | ICD-10-CM

## 2022-10-26 LAB — ECHOCARDIOGRAM COMPLETE
AR max vel: 2.52 cm2
AV Area VTI: 2.8 cm2
AV Area mean vel: 2.66 cm2
AV Mean grad: 6 mmHg
AV Peak grad: 12.7 mmHg
Ao pk vel: 1.78 m/s
Area-P 1/2: 2.24 cm2
Calc EF: 56.8 %
S' Lateral: 3.1 cm
Single Plane A2C EF: 55.5 %
Single Plane A4C EF: 58.5 %

## 2022-11-05 ENCOUNTER — Ambulatory Visit: Payer: Medicare HMO | Admitting: Cardiology

## 2022-11-07 ENCOUNTER — Other Ambulatory Visit: Payer: Self-pay | Admitting: Cardiovascular Disease

## 2022-11-07 NOTE — Telephone Encounter (Signed)
Refill request

## 2022-11-07 NOTE — Telephone Encounter (Signed)
Prescription refill request for Xarelto received.  Indication: AF Last office visit:  10/10/22  Kerry Lore NP Weight: 96.1kg Age: 76 Scr:  1.02 on 10/10/22 CrCl: 70.07  Based on above findings Xarelto '20mg'$  daily is the appropriate dose.  Refill approved.

## 2022-11-08 DIAGNOSIS — M1711 Unilateral primary osteoarthritis, right knee: Secondary | ICD-10-CM | POA: Diagnosis not present

## 2022-11-14 ENCOUNTER — Ambulatory Visit: Payer: Medicare HMO | Admitting: Physician Assistant

## 2022-11-20 DIAGNOSIS — M1711 Unilateral primary osteoarthritis, right knee: Secondary | ICD-10-CM | POA: Diagnosis not present

## 2022-12-04 ENCOUNTER — Other Ambulatory Visit: Payer: Medicare HMO

## 2023-01-15 ENCOUNTER — Other Ambulatory Visit: Payer: Self-pay | Admitting: Cardiovascular Disease

## 2023-02-10 ENCOUNTER — Encounter: Payer: Self-pay | Admitting: Cardiovascular Disease

## 2023-02-11 ENCOUNTER — Encounter: Payer: Self-pay | Admitting: Family Medicine

## 2023-02-11 NOTE — Telephone Encounter (Signed)
She needs a visit to discuss this.   I reviewed her last ortho appt (sounds like she is ready for knee replacement soon) Anything stronger may require discussion rec; controlled substances etc

## 2023-02-12 NOTE — Telephone Encounter (Signed)
See pt's response with I advised her appt wold be needed

## 2023-02-17 ENCOUNTER — Other Ambulatory Visit: Payer: Self-pay | Admitting: Family Medicine

## 2023-02-17 NOTE — Telephone Encounter (Signed)
Last OV was an acute appt for leg issues. Pt hasn't had a recent f/u or CPE and no future appts., please advise

## 2023-03-15 NOTE — Progress Notes (Unsigned)
Cardiology Office Note  Date:  03/17/2023   ID:  [name], DOB 1945/12/01, MRN [id number]  PCP:  Kerry Pimple, MD   Chief Complaint  Patient presents with   6 month follow up     Patient c/o fluttering in chest at times and shortness of breath with walking a short distance or with any amount of over exertion. Medications reviewed by the patient verbally.     HPI:  [name] Simpson is a pleasant 77 year-old woman with history of  obesity,  hypertension,  GERD  Paroxysmal atrial fibrillation, on amiodarone Bradycardia, sick sinus syndrome on beta-blockers Snoring, previously canceled sleep study who presents for routine follow-up for persistent atrial fibrillation  Last seen by myself in clinic 5/23 Seen by one of our providers 11/23 Was in atrial fibrillation, amiodarone increased to 200 bid for 7 days EKG 10/11/22: atrial flutter She cancelled EKG and DCCV 12/23 as she was feeling better  Dec 23, knee issues, cortisone did not work, gel injection can walk  Last week with tachycardia, happened at night Rate 120, Took extra amiodarone , back to NSR by the morning  Not on beta-blocker secondary to bradycardia, sick sinus syndrome  Denies significant leg swelling, Rare lasix use, 2-3 x a month  No shortness of breath or chest pain concerning for angina  Husband retired  Labs reviewed with her in detail HBA1C 6.2 in 2021 totaL CHOL 141,  Ldl 67 in 2021 HCT 43 TSH normal from 09/2022  EKG personally reviewed by myself on todays visit shows normal sinus rhythm with rate 73 bpm, left axis deviation   Other past medical history reviewed Echo 02/2016: EF 60%, RVSp 38  Started on amio 03/2016: CHADS-VASc score is greater than or equal to 3 (hypertension, age, gender)   Seen by dr. Graciela Husbands 04/2016:  Tachy/brady digoxin held sleep disordered breathing and evidence of HFpEF.  Does not think she has OSA Cancelled sleep study Previous cardioversion cancelled, she  converted on her own on amio   Echocardiogram  showing normal LV function, mildly elevated right heart pressures    PMH:   has a past medical history of Anemia, Atrial fibrillation (HCC), Cataract (05/13/2018), Complication of anesthesia, Difficult intubation, Fibrocystic breast, Fracture of right shoulder, Gallstones, Grave's disease, Graves disease, Hypertension, Obesity, and Osteoporosis.  PSH:    Past Surgical History:  Procedure Laterality Date   CATARACT EXTRACTION W/PHACO Right 09/02/2022   Procedure: CATARACT EXTRACTION PHACO AND INTRAOCULAR LENS PLACEMENT (IOC) RIGHT;  Surgeon: Nevada Crane, MD;  Location: [facility];  Service: Ophthalmology;  Laterality: Right;  8.31 0:49.5   CATARACT EXTRACTION W/PHACO Left 09/16/2022   Procedure: CATARACT EXTRACTION PHACO AND INTRAOCULAR LENS PLACEMENT (IOC) LEFT;  Surgeon: Nevada Crane, MD;  Location: [facility];  Service: Ophthalmology;  Laterality: Left;  13.18 1:10.5   COLONOSCOPY  09/29/2014   Normal, Lina Sar, MD   ELECTROPHYSIOLOGIC STUDY N/A 04/12/2016   Procedure: CARDIOVERSION;  Surgeon: Antonieta Iba, MD;  Location: [facility] ORS;  Service: Cardiovascular;  Laterality: N/A;   ESOPHAGOGASTRODUODENOSCOPY (EGD) WITH PROPOFOL N/A 09/08/2018   Procedure: ESOPHAGOGASTRODUODENOSCOPY (EGD) WITH PROPOFOL;  Surgeon: Christena Deem, MD;  Location: [facility];  Service: Endoscopy;  Laterality: N/A;   ESOPHAGOGASTRODUODENOSCOPY ENDOSCOPY     EUS N/A 01/12/2015   Procedure: UPPER ENDOSCOPIC ULTRASOUND (EUS) LINEAR;  Surgeon: Rachael Fee, MD;  Location: [facility];  Service: Endoscopy;  Laterality: N/A;   TUBAL LIGATION  Current Outpatient Medications  Medication Sig Dispense Refill   acetaminophen (TYLENOL) 500 MG tablet Take 1,000 mg by mouth daily as needed.      amiodarone (PACERONE) 200 MG tablet Take 200 mg by mouth daily.     amLODipine (NORVASC) 5 MG tablet TAKE ONE TABLET BY MOUTH ONCE A DAY  90 tablet 3   fosinopril (MONOPRIL) 20 MG tablet TAKE 1 TABLET BY MOUTH ONCE DAILY 90 tablet 1   furosemide (LASIX) 20 MG tablet Take 1 tablet (20 mg total) by mouth as directed. Take 1 tablet (20 mg) once a day for 3 days then take as needed. 90 tablet 3   melatonin 5 MG TABS Take 10 mg by mouth at bedtime as needed.     Misc Natural Products (YUMVS BEET ROOT-TART CHERRY PO) Take 1 tablet by mouth daily.     Multiple Vitamin (MULTIVITAMIN) capsule Take 1 capsule by mouth daily.     omeprazole (PRILOSEC) 20 MG capsule TAKE ONE CAPSULE BY MOUTH ONCE DAILY 90 capsule 0   PARoxetine (PAXIL) 20 MG tablet TAKE 1 TABLET BY MOUTH ONCE A DAY 90 tablet 3   XARELTO 20 MG TABS tablet TAKE 1 TABLET BY MOUTH ONCE A DAY WITH SUPPER 90 tablet 1   No current facility-administered medications for this visit.     Allergies:   Patient has no known allergies.   Social History:  The patient  reports that she has never smoked. She has never used smokeless tobacco. She reports that she does not drink alcohol and does not use drugs.   Family History:   family history includes Cancer in her brother; Diabetes in her father; Heart disease in her brother; Heart failure in her brother; Hypertension in her brother, father, and mother.    Review of Systems: Review of Systems  Constitutional: Negative.   HENT: Negative.    Respiratory: Negative.    Cardiovascular: Negative.   Gastrointestinal: Negative.   Musculoskeletal: Negative.   Neurological: Negative.   Psychiatric/Behavioral: Negative.    All other systems reviewed and are negative.   PHYSICAL EXAM: VS:  BP (!) 140/82 (BP Location: Left Arm, Patient Position: Sitting, Cuff Size: Normal)   Pulse 73   Ht 5\' 1"  (1.549 m)   Wt 212 lb 4 oz (96.3 kg)   SpO2 94%   BMI 40.10 kg/m  , BMI Body mass index is 40.1 kg/m. Constitutional: Obese oriented to person, place, and time. No distress.  HENT:  Head: Grossly normal Eyes:  no discharge. No scleral  icterus.  Neck: No JVD, no carotid bruits  Cardiovascular: Regular rate and rhythm, 2/6 SEM RSB Pulmonary/Chest: Clear to auscultation bilaterally, no wheezes or rails Abdominal: Soft.  no distension.  no tenderness.  Musculoskeletal: Normal range of motion Neurological:  normal muscle tone. Coordination normal. No atrophy Skin: Skin warm and dry Psychiatric: normal affect, pleasant   Recent Labs: 10/10/2022: ALT 16; BUN 16; Creatinine, Ser 1.02; Hemoglobin 12.7; Platelets 303; Potassium 4.3; Sodium 140; TSH 1.634    Lipid Panel Lab Results  Component Value Date   CHOL 141 06/28/2020   HDL 53.80 06/28/2020   LDLCALC 67 06/28/2020   TRIG 102.0 06/28/2020    Wt Readings from Last 3 Encounters:  03/17/23 212 lb 4 oz (96.3 kg)  10/10/22 211 lb 12.8 oz (96.1 kg)  09/16/22 206 lb (93.4 kg)     ASSESSMENT AND PLAN:  Essential hypertension - Plan: EKG 12-Lead Blood pressure is well controlled on today's visit.  No changes made to the medications.  Atrial fibrillation and flutter (HCC) -  Periodically takes extra amiodarone for breakthrough arrhythmia On anticoagulation, rhythm control with amiodarone In the setting of sick sinus syndrome she is not on beta blocker, previously had bradycardia Maintaining normal sinus rhythm Normal TSH May be exacerbated by sleep disordered breathing, sleep apnea, obesity, previously canceled sleep study  Class 2 obesity due to excess calories without serious comorbidity with body mass index (BMI) of 37.0 to 37.9 in adult We have encouraged continued exercise, careful diet management in an effort to lose weight.  DOE (dyspnea on exertion) Secondary to obesity, deconditioning Reports symptoms are stable Weight loss and walking program recommended, water aerobics given knee pain  Chest pain, unspecified type No recent symptoms No further ischemic work-up ordered at this time  Sleep disturbance Reports sleeping relatively well,  snoring Previously declined sleep study   Total encounter time more than 30 minutes  Greater than 50% was spent in counseling and coordination of care with the patient   No orders of the defined types were placed in this encounter.    Signed, Dossie Arbour, M.D., Ph.D. 03/17/2023  [facility] [place], [place] [phone]

## 2023-03-17 ENCOUNTER — Ambulatory Visit: Payer: Medicare HMO | Attending: Cardiovascular Disease | Admitting: Cardiovascular Disease

## 2023-03-17 ENCOUNTER — Encounter: Payer: Self-pay | Admitting: Cardiovascular Disease

## 2023-03-17 VITALS — BP 135/82 | HR 73 | Ht 61.0 in | Wt 212.2 lb

## 2023-03-17 DIAGNOSIS — I5030 Unspecified diastolic (congestive) heart failure: Secondary | ICD-10-CM

## 2023-03-17 DIAGNOSIS — I10 Essential (primary) hypertension: Secondary | ICD-10-CM | POA: Diagnosis not present

## 2023-03-17 DIAGNOSIS — I4892 Unspecified atrial flutter: Secondary | ICD-10-CM

## 2023-03-17 DIAGNOSIS — I4891 Unspecified atrial fibrillation: Secondary | ICD-10-CM | POA: Diagnosis not present

## 2023-03-17 NOTE — Patient Instructions (Signed)
Medication Instructions:  No changes  If you need a refill on your cardiac medications before your next appointment, please call your pharmacy.   Lab work: No new labs needed  Testing/Procedures: No new testing needed  Follow-Up: At [facility], you and your health needs are our priority.  As part of our continuing mission to provide you with exceptional heart care, we have created designated Provider Care Teams.  These Care Teams include your primary Cardiologist (physician) and Advanced Practice Providers (APPs -  Physician Assistants and Nurse Practitioners) who all work together to provide you with the care you need, when you need it.  You will need a follow up appointment in 12 months  Providers on your designated Care Team:   Christopher Berge, NP Ryan Dunn, PA-C Cadence Furth, PA-C  COVID-19 Vaccine Information can be found at: [email or web address] For questions related to vaccine distribution or appointments, please email [email or web address] or call [phone].   

## 2023-03-20 ENCOUNTER — Other Ambulatory Visit: Payer: Self-pay | Admitting: Family Medicine

## 2023-03-21 NOTE — Telephone Encounter (Signed)
Pt is overdue for a CPE (labs prior if possible), please schedule (med refilled once)

## 2023-03-22 ENCOUNTER — Other Ambulatory Visit: Payer: Self-pay | Admitting: Cardiovascular Disease

## 2023-03-24 NOTE — Telephone Encounter (Signed)
LVMTCB and sched

## 2023-03-31 ENCOUNTER — Telehealth: Payer: Self-pay | Admitting: Cardiovascular Disease

## 2023-03-31 DIAGNOSIS — I4891 Unspecified atrial fibrillation: Secondary | ICD-10-CM

## 2023-03-31 NOTE — Telephone Encounter (Signed)
Pt called back, she states her bp is 153/89 and took it again it was 162/96 this morning. She also stated that my previous message was wrong, she took 5 amiodarones since last night

## 2023-03-31 NOTE — Telephone Encounter (Signed)
  STAT if HR is under 50 or over 120 (normal HR is 60-100 beats per minute)  What is your heart rate? 111  Do you have a log of your heart rate readings (document readings)? 10:30pm: 110       Took an amiodarone 12:00am: 63 1:00am: 116 Took another pill at 3 am 115 116 107  Since 6am its been around 111   Do you have any other symptoms? "No maybe a little pressure on the chest but not much"    Pt states she was told to take amiodarone everytime her heart was "out of rhythm." She states she is a little concerned because she's taken a lot in a short amount of time. Please advise.

## 2023-04-01 ENCOUNTER — Encounter: Payer: Self-pay | Admitting: Cardiovascular Disease

## 2023-04-08 NOTE — Telephone Encounter (Signed)
Please see updated mychart message. EP referral placed  Message sent to scheduling   Gollan, Tollie Pizza, MD  Cv Div [facility] TriageYesterday (8:58 AM)    Can we call to see if she is converted from atrial flutter to normal sinus rhythm Given continued breakthrough arrhythmia despite amiodarone, needs referral to EP Could consider ablation versus alternate antiarrhythmic Thx TGollan

## 2023-04-09 ENCOUNTER — Telehealth: Payer: Self-pay | Admitting: Family Medicine

## 2023-04-09 DIAGNOSIS — I10 Essential (primary) hypertension: Secondary | ICD-10-CM

## 2023-04-09 DIAGNOSIS — R7303 Prediabetes: Secondary | ICD-10-CM

## 2023-04-09 DIAGNOSIS — D509 Iron deficiency anemia, unspecified: Secondary | ICD-10-CM

## 2023-04-09 DIAGNOSIS — E038 Other specified hypothyroidism: Secondary | ICD-10-CM

## 2023-04-09 NOTE — Telephone Encounter (Signed)
-----   Message from Alvina Chou sent at 03/27/2023  3:46 PM EDT ----- Regarding: Lab orders for Thursday, 5.30.24 Patient is scheduled for CPX labs, please order future labs, Thanks , Camelia Eng

## 2023-04-10 ENCOUNTER — Other Ambulatory Visit (INDEPENDENT_AMBULATORY_CARE_PROVIDER_SITE_OTHER): Payer: Medicare HMO

## 2023-04-10 DIAGNOSIS — R7303 Prediabetes: Secondary | ICD-10-CM

## 2023-04-10 DIAGNOSIS — E038 Other specified hypothyroidism: Secondary | ICD-10-CM

## 2023-04-10 DIAGNOSIS — D509 Iron deficiency anemia, unspecified: Secondary | ICD-10-CM

## 2023-04-10 DIAGNOSIS — I10 Essential (primary) hypertension: Secondary | ICD-10-CM | POA: Diagnosis not present

## 2023-04-10 LAB — COMPREHENSIVE METABOLIC PANEL
ALT: 18 U/L (ref 0–35)
AST: 21 U/L (ref 0–37)
Albumin: 4.1 g/dL (ref 3.5–5.2)
Alkaline Phosphatase: 89 U/L (ref 39–117)
BUN: 13 mg/dL (ref 6–23)
CO2: 26 mEq/L (ref 19–32)
Calcium: 8.8 mg/dL (ref 8.4–10.5)
Chloride: 103 mEq/L (ref 96–112)
Creatinine, Ser: 0.96 mg/dL (ref 0.40–1.20)
GFR: 56.98 mL/min — ABNORMAL LOW (ref >60.00)
Glucose, Bld: 107 mg/dL — ABNORMAL HIGH (ref 70–99)
Potassium: 4.3 mEq/L (ref 3.5–5.1)
Sodium: 140 mEq/L (ref 135–145)
Total Bilirubin: 0.4 mg/dL (ref 0.2–1.2)
Total Protein: 6.6 g/dL (ref 6.0–8.3)

## 2023-04-10 LAB — LIPID PANEL
Cholesterol: 147 mg/dL (ref 0–200)
HDL: 54.5 mg/dL (ref >39.00)
LDL Cholesterol: 71 mg/dL (ref 0–99)
NonHDL: 92.03
Total CHOL/HDL Ratio: 3
Triglycerides: 104 mg/dL (ref 0.0–149.0)
VLDL: 20.8 mg/dL (ref 0.0–40.0)

## 2023-04-10 LAB — TSH: TSH: 1.61 u[IU]/mL (ref 0.35–5.50)

## 2023-04-10 LAB — CBC WITH DIFFERENTIAL/PLATELET
Basophils Absolute: 0.1 10*3/uL (ref 0.0–0.1)
Basophils Relative: 1 % (ref 0.0–3.0)
Eosinophils Absolute: 0.1 10*3/uL (ref 0.0–0.7)
Eosinophils Relative: 1.9 % (ref 0.0–5.0)
HCT: 39.9 % (ref 36.0–46.0)
Hemoglobin: 12.4 g/dL (ref 12.0–15.0)
Lymphocytes Relative: 22.1 % (ref 12.0–46.0)
Lymphs Abs: 1.5 10*3/uL (ref 0.7–4.0)
MCHC: 31 g/dL (ref 30.0–36.0)
MCV: 80.9 fl (ref 78.0–100.0)
Monocytes Absolute: 0.6 10*3/uL (ref 0.1–1.0)
Monocytes Relative: 9.2 % (ref 3.0–12.0)
Neutro Abs: 4.5 10*3/uL (ref 1.4–7.7)
Neutrophils Relative %: 65.8 % (ref 43.0–77.0)
Platelets: 285 10*3/uL (ref 150.0–400.0)
RBC: 4.93 Mil/uL (ref 3.87–5.11)
RDW: 16.2 % — ABNORMAL HIGH (ref 11.5–15.5)
WBC: 6.8 10*3/uL (ref 4.0–10.5)

## 2023-04-10 LAB — T4, FREE: Free T4: 1.22 ng/dL (ref 0.60–1.60)

## 2023-04-10 LAB — IRON: Iron: 42 ug/dL (ref 42–145)

## 2023-04-10 LAB — HEMOGLOBIN A1C: Hgb A1c MFr Bld: 6.2 % (ref 4.6–6.5)

## 2023-04-15 ENCOUNTER — Ambulatory Visit (INDEPENDENT_AMBULATORY_CARE_PROVIDER_SITE_OTHER): Payer: Medicare HMO | Admitting: Family Medicine

## 2023-04-15 ENCOUNTER — Encounter: Payer: Self-pay | Admitting: Family Medicine

## 2023-04-15 VITALS — BP 124/64 | HR 72 | Temp 97.2°F | Ht 59.5 in | Wt 205.0 lb

## 2023-04-15 DIAGNOSIS — Z1211 Encounter for screening for malignant neoplasm of colon: Secondary | ICD-10-CM | POA: Insufficient documentation

## 2023-04-15 DIAGNOSIS — M85852 Other specified disorders of bone density and structure, left thigh: Secondary | ICD-10-CM

## 2023-04-15 DIAGNOSIS — I10 Essential (primary) hypertension: Secondary | ICD-10-CM | POA: Diagnosis not present

## 2023-04-15 DIAGNOSIS — E038 Other specified hypothyroidism: Secondary | ICD-10-CM

## 2023-04-15 DIAGNOSIS — Z1231 Encounter for screening mammogram for malignant neoplasm of breast: Secondary | ICD-10-CM | POA: Insufficient documentation

## 2023-04-15 DIAGNOSIS — D509 Iron deficiency anemia, unspecified: Secondary | ICD-10-CM | POA: Diagnosis not present

## 2023-04-15 DIAGNOSIS — Z0000 Encounter for general adult medical examination without abnormal findings: Secondary | ICD-10-CM | POA: Diagnosis not present

## 2023-04-15 DIAGNOSIS — R7303 Prediabetes: Secondary | ICD-10-CM | POA: Diagnosis not present

## 2023-04-15 DIAGNOSIS — R4589 Other symptoms and signs involving emotional state: Secondary | ICD-10-CM | POA: Diagnosis not present

## 2023-04-15 DIAGNOSIS — I4891 Unspecified atrial fibrillation: Secondary | ICD-10-CM | POA: Diagnosis not present

## 2023-04-15 DIAGNOSIS — E2839 Other primary ovarian failure: Secondary | ICD-10-CM | POA: Diagnosis not present

## 2023-04-15 DIAGNOSIS — I4892 Unspecified atrial flutter: Secondary | ICD-10-CM | POA: Diagnosis not present

## 2023-04-15 DIAGNOSIS — I5030 Unspecified diastolic (congestive) heart failure: Secondary | ICD-10-CM

## 2023-04-15 NOTE — Assessment & Plan Note (Signed)
Lab Results  Component Value Date   TSH 1.61 04/10/2023   FT4 in normal range Will continue to watch No clinical changes

## 2023-04-15 NOTE — Patient Instructions (Addendum)
Call and schedule your mobilte mammogram   If you don 't get a call or email about cologuard in the next 1-2 weeks let us know   Add some strength training to your routine, this is important for bone and brain health and can reduce your risk of falls and help your body use insulin properly and regulate weight  Light weights, exercise bands , and internet videos are a good way to start  Yoga (chair or regular), machines , floor exercises or a gym with machines are also good options    Iron is low normal  Multi vitamin with iron is a good idea   Try to get most of your carbohydrates from produce (with the exception of white potatoes)  Eat less bread/pasta/rice/snack foods/cereals/sweets and other items from the middle of the grocery store (processed carbs)  Step one may be cutting out sweet backed goods to one day a week   If you want to come back and discuss mood and depression symptoms please do  There are ways we can help     Call and schedule your bone density test  You have an order for:  []   2D Mammogram  []   3D Mammogram  [x]   Bone Density     Please call for appointment:   [x]   [facility]  [address] [place] [address]  [phone]  []   [facility])   [address]  [place], [place] [address]  [phone]  []   [facility]      [address] [place], [place]        [phone]         []   [facility]  [address] [place], [place]  [phone]  []  [facility]   [address]   [place], [place] [address]  [phone]  []  [facility]  [address] [place], [place] [address] [phone]    Make sure to wear two piece clothing  No lotions powders or deodorants the day of the appointment Make  sure to bring picture ID and insurance card.  Bring list of medications you are currently taking including any supplements.   Schedule your screening mammogram through MyChart!   Select [place] imaging sites can now be scheduled through MyChart.  Log into your MyChart account.  Go to 'Visit' (or 'Appointments' if  on mobile App) --> Schedule an  Appointment  Under 'Select a Reason for Visit' choose the Mammogram  Screening option.  Complete the pre-visit questions  and select the time and place that  best fits your schedule

## 2023-04-15 NOTE — Assessment & Plan Note (Signed)
Clinically stable Reviewed last cardiology note On ace

## 2023-04-15 NOTE — Assessment & Plan Note (Signed)
Cologuard ordered

## 2023-04-15 NOTE — Assessment & Plan Note (Signed)
Mammogram ordered for mobile unit Pt will call to schedule

## 2023-04-15 NOTE — Assessment & Plan Note (Signed)
Ast dexa 2019 New one ordered-pt will call to schedule No falls or fracture Taking vti D3  Disc need for calcium/ vitamin D/ wt bearing exercise and bone density test every 2 y to monitor Disc safety/ fracture risk in detail  Discussed fall prevention

## 2023-04-15 NOTE — Assessment & Plan Note (Signed)
Dexa ordered

## 2023-04-15 NOTE — Assessment & Plan Note (Signed)
PHQ is elevated  Pt declines counseling or additional treatment Continues paxil

## 2023-04-15 NOTE — Progress Notes (Signed)
Subjective:    Patient ID: Kerry Simpson, female    DOB: [birth year], 77 y.o.   MRN: [id number]  HPI Here for health maintenance exam and to review chronic medical problems    Wt Readings from Last 3 Encounters:  04/15/23 205 lb (93 kg)  03/17/23 212 lb 4 oz (96.3 kg)  10/10/22 211 lb 12.8 oz (96.1 kg)   40.71 kg/m  Vitals:   04/15/23 0822  BP: 124/64  Pulse: 72  Temp: (!) 97.2 F (36.2 C)  SpO2: 96%    Doing ok  Trying to take care of herself   Problems with R knee - needs replacement / tried the gel shot in the meantime / is a bit more mobile  Dealing with a fib     Immunization History  Administered Date(s) Administered   Fluad Quad(high Dose 65+) 08/24/2020   Influenza Split 08/01/2011, 01/08/2012   Influenza Whole 09/25/2007, 08/22/2008, 10/12/2009, 07/11/2010, 08/18/2012   Influenza, High Dose Seasonal PF 06/25/2019   Influenza,inj,Quad PF,6+ Mos 10/13/2015, 08/20/2016, 07/23/2018   Influenza-Unspecified 09/22/2017   PFIZER(Purple Top)SARS-COV-2 Vaccination 11/30/2019, 12/22/2019, 08/25/2020, 03/05/2021   Pneumococcal Conjugate-13 04/18/2014   Pneumococcal Polysaccharide-23 09/28/2010   Tdap 11/07/2021   Zoster Recombinat (Shingrix) 11/07/2021, 01/31/2022   Zoster, Live 11/12/2013   Health Maintenance Due  Topic Date Due   COVID-19 Vaccine (5 - 2023-24 season) 07/12/2022   MAMMOGRAM  10/08/2022   Medicare Annual Wellness (AWV)  03/29/2023   Mammogram 09/2021- is going to schedule with our mobile mammogram unit  Self breast exam- no lu,ps   Colon cancer screening - colonoscopy 2015 no recall due to age  Neg cologuard 05/2018  Wants to do that again    Dexa 06/2018 - osteopenia -will call to schedule that at [facility]  Supplements - taking vitamin D3  Exercise - not a lot due to knee  Does not need a walker right now   Mood    04/15/2023    8:26 AM 03/28/2022    4:01 PM 06/30/2020   11:24 AM 05/26/2019   10:35 AM  05/13/2018    9:59 AM  Depression screen PHQ 2/9  Decreased Interest 2 0 0 0 0  Down, Depressed, Hopeless 2 0 0 0 0  PHQ - 2 Score 4 0 0 0 0  Altered sleeping 2    0  Tired, decreased energy 2    0  Change in appetite 2    0  Feeling bad or failure about yourself  3    0  Trouble concentrating 2    0  Moving slowly or fidgety/restless 3    0  Suicidal thoughts 0    0  PHQ-9 Score 18    0  Difficult doing work/chores Somewhat difficult    Not difficult at all  Husband is aging  Family stress Declines change in med or counseling    HTN bp is stable today  No cp or palpitations or headaches or edema  No side effects to medicines  BP Readings from Last 3 Encounters:  04/15/23 124/64  03/17/23 135/82  10/10/22 112/72    Amlodipine 5 mg daily  Fosinopril 20 mg daily  Lasix 20 mg twice weekly   Pulse Readings from Last 3 Encounters:  04/15/23 72  03/17/23 73  10/10/22 (!) 113   Seeing EP specialist next    A fit= on amiodarone under care of cardiology with xarelto  Also CHF  Prediabetes Lab Results  Component Value Date   HGBA1C 6.2 04/10/2023  Last was 6.0   Not good about watching sodium  Eats a fair amount of sugar   Not ready to make any changes    Lipids Lab Results  Component Value Date   CHOL 147 04/10/2023   CHOL 141 06/28/2020   CHOL 164 05/24/2019   Lab Results  Component Value Date   HDL 54.50 04/10/2023   HDL 53.80 06/28/2020   HDL 44.60 05/24/2019   Lab Results  Component Value Date   LDLCALC 71 04/10/2023   LDLCALC 67 06/28/2020   LDLCALC 80 05/24/2019   Lab Results  Component Value Date   TRIG 104.0 04/10/2023   TRIG 102.0 06/28/2020   TRIG 196.0 (H) 05/24/2019   Lab Results  Component Value Date   CHOLHDL 3 04/10/2023   CHOLHDL 3 06/28/2020   CHOLHDL 4 05/24/2019   No results found for: "LDLDIRECT" Diet controlled   Subclinical hypothyroidism Lab Results  Component Value Date   TSH 1.61 04/10/2023   FT4 of 1.22  -normal   Iron def anemia  Lab Results  Component Value Date   WBC 6.8 04/10/2023   HGB 12.4 04/10/2023   HCT 39.9 04/10/2023   MCV 80.9 04/10/2023   PLT 285.0 04/10/2023   Lab Results  Component Value Date   IRON 42 04/10/2023   FERRITIN 6.9 (L) 06/28/2020     Last metabolic panel Lab Results  Component Value Date   GLUCOSE 107 (H) 04/10/2023   NA 140 04/10/2023   K 4.3 04/10/2023   CL 103 04/10/2023   CO2 26 04/10/2023   BUN 13 04/10/2023   CREATININE 0.96 04/10/2023   GFRNONAA 57 (L) 10/10/2022   CALCIUM 8.8 04/10/2023   PHOS 3.1 09/25/2010   PROT 6.6 04/10/2023   ALBUMIN 4.1 04/10/2023   LABGLOB 2.3 07/20/2021   AGRATIO 1.8 07/20/2021   BILITOT 0.4 04/10/2023   ALKPHOS 89 04/10/2023   AST 21 04/10/2023   ALT 18 04/10/2023   ANIONGAP 8 10/10/2022   Patient Active Problem List   Diagnosis Date Noted   Colon cancer screening 04/15/2023   Encounter for screening mammogram for breast cancer 04/15/2023   Adverse effect of amiodarone 04/10/2022   Skin lesion of left leg 04/10/2022   Mass of soft tissue of lower leg 04/10/2022   Left knee pain 07/04/2021   Prediabetes 05/23/2019   Iron deficiency anemia 05/23/2018   Screening mammogram, encounter for 05/20/2018   (HFpEF) heart failure with preserved ejection fraction (HCC) 05/19/2017   GERD (gastroesophageal reflux disease) 08/20/2016   DOE (dyspnea on exertion) 05/28/2016   Sleep disturbance 05/28/2016   Somnolence, daytime 05/09/2016   Encounter for anticoagulation discussion and counseling 02/15/2016   Atrial fibrillation and flutter (HCC) 02/14/2016   Routine general medical examination at a health care facility 10/13/2015   Estrogen deficiency 10/13/2015   History of melena 12/12/2014   Gallstone 12/07/2014   Encounter for routine gynecological examination 04/14/2013   STRESS REACTION, ACUTE, WITH EMOTIONAL DISTURBANCE 11/26/2010   Osteopenia 06/21/2009   Subclinical hypothyroidism 04/26/2008    Morbid obesity (HCC) 04/26/2008   Essential hypertension 04/26/2008   Past Medical History:  Diagnosis Date   Anemia    iron def   Atrial fibrillation (HCC)    Dr Mariah Milling   Cataract 05/13/2018   both eyes per last vision exam    Complication of anesthesia    "quit breathing" -Endoscopy( 12-22-14)   Difficult intubation    Pt  reports unaware of difficulty   Fibrocystic breast    Fracture of right shoulder    Gallstones    Grave's disease    Graves disease    Hypertension    Obesity    Osteoporosis    osteopenia   Past Surgical History:  Procedure Laterality Date   CATARACT EXTRACTION W/PHACO Right 09/02/2022   Procedure: CATARACT EXTRACTION PHACO AND INTRAOCULAR LENS PLACEMENT (IOC) RIGHT;  Surgeon: Nevada Crane, MD;  Location: [facility];  Service: Ophthalmology;  Laterality: Right;  8.31 0:49.5   CATARACT EXTRACTION W/PHACO Left 09/16/2022   Procedure: CATARACT EXTRACTION PHACO AND INTRAOCULAR LENS PLACEMENT (IOC) LEFT;  Surgeon: Nevada Crane, MD;  Location: [facility];  Service: Ophthalmology;  Laterality: Left;  13.18 1:10.5   COLONOSCOPY  09/29/2014   Normal, Lina Sar, MD   ELECTROPHYSIOLOGIC STUDY N/A 04/12/2016   Procedure: CARDIOVERSION;  Surgeon: Antonieta Iba, MD;  Location: [facility];  Service: Cardiovascular;  Laterality: N/A;   ESOPHAGOGASTRODUODENOSCOPY (EGD) WITH PROPOFOL N/A 09/08/2018   Procedure: ESOPHAGOGASTRODUODENOSCOPY (EGD) WITH PROPOFOL;  Surgeon: Christena Deem, MD;  Location: [facility];  Service: Endoscopy;  Laterality: N/A;   ESOPHAGOGASTRODUODENOSCOPY ENDOSCOPY     EUS N/A 01/12/2015   Procedure: UPPER ENDOSCOPIC ULTRASOUND (EUS) LINEAR;  Surgeon: Rachael Fee, MD;  Location: [facility];  Service: Endoscopy;  Laterality: N/A;   TUBAL LIGATION     Social History   Tobacco Use   Smoking status: Never    Passive exposure: Never   Smokeless tobacco: Never  Vaping Use   Vaping Use: Never used   Substance Use Topics   Alcohol use: No    Alcohol/week: 0.0 standard drinks of alcohol   Drug use: No   Family History  Problem Relation Age of Onset   Hypertension Mother    Diabetes Father    Hypertension Father    Hypertension Brother    Cancer Brother        prostate   Heart failure Brother    Heart disease Brother        with bypass   Colon cancer Neg Hx    Breast cancer Neg Hx    No Known Allergies Current Outpatient Medications on File Prior to Visit  Medication Sig Dispense Refill   acetaminophen (TYLENOL) 500 MG tablet Take 1,000 mg by mouth daily as needed.      amiodarone (PACERONE) 200 MG tablet TAKE ONE TABLET BY MOUTH ONCE A DAY 90 tablet 2   amLODipine (NORVASC) 5 MG tablet TAKE ONE TABLET BY MOUTH ONCE A DAY 90 tablet 3   fosinopril (MONOPRIL) 20 MG tablet TAKE ONE TABLET BY MOUTH ONCE DAILY 90 tablet 0   melatonin 5 MG TABS Take 10 mg by mouth at bedtime as needed.     Misc Natural Products (YUMVS BEET ROOT-TART CHERRY PO) Take 1 tablet by mouth daily.     Multiple Vitamin (MULTIVITAMIN) capsule Take 1 capsule by mouth daily.     omeprazole (PRILOSEC) 20 MG capsule TAKE ONE CAPSULE BY MOUTH ONCE DAILY 90 capsule 0   PARoxetine (PAXIL) 20 MG tablet TAKE 1 TABLET BY MOUTH ONCE A DAY 90 tablet 3   XARELTO 20 MG TABS tablet TAKE 1 TABLET BY MOUTH ONCE A DAY WITH SUPPER 90 tablet 1   furosemide (LASIX) 20 MG tablet Take 1 tablet (20 mg total) by mouth as directed. Take 1 tablet (20 mg) once a day for 3 days then take as needed. 90  tablet 3   No current facility-administered medications on file prior to visit.     Review of Systems  Constitutional:  Positive for fatigue. Negative for activity change, appetite change, fever and unexpected weight change.  HENT:  Negative for congestion, ear pain, rhinorrhea, sinus pressure and sore throat.   Eyes:  Negative for pain, redness and visual disturbance.  Respiratory:  Negative for cough, shortness of breath and  wheezing.   Cardiovascular:  Negative for chest pain and palpitations.  Gastrointestinal:  Negative for abdominal pain, blood in stool, constipation and diarrhea.  Endocrine: Negative for polydipsia and polyuria.  Genitourinary:  Negative for dysuria, frequency and urgency.  Musculoskeletal:  Positive for arthralgias. Negative for back pain and myalgias.  Skin:  Negative for pallor and rash.  Allergic/Immunologic: Negative for environmental allergies.  Neurological:  Negative for dizziness, syncope and headaches.  Hematological:  Negative for adenopathy. Does not bruise/bleed easily.  Psychiatric/Behavioral:  Positive for dysphoric mood. Negative for decreased concentration. The patient is not nervous/anxious.        Low motivation  stressors       Objective:   Physical Exam Constitutional:      General: She is not in acute distress.    Appearance: Normal appearance. She is well-developed. She is obese. She is not ill-appearing or diaphoretic.  HENT:     Head: Normocephalic and atraumatic.     Right Ear: Tympanic membrane, ear canal and external ear normal.     Left Ear: Tympanic membrane, ear canal and external ear normal.     Nose: Nose normal. No congestion.     Mouth/Throat:     Mouth: Mucous membranes are moist.     Pharynx: Oropharynx is clear. No posterior oropharyngeal erythema.  Eyes:     General: No scleral icterus.    Extraocular Movements: Extraocular movements intact.     Conjunctiva/sclera: Conjunctivae normal.     Pupils: Pupils are equal, round, and reactive to light.  Neck:     Thyroid: No thyromegaly.     Vascular: No carotid bruit or JVD.  Cardiovascular:     Rate and Rhythm: Normal rate and regular rhythm.     Pulses: Normal pulses.     Heart sounds: Normal heart sounds.     No gallop.  Pulmonary:     Effort: Pulmonary effort is normal. No respiratory distress.     Breath sounds: Normal breath sounds. No wheezing.     Comments: Good air exch Chest:      Chest wall: No tenderness.  Abdominal:     General: Bowel sounds are normal. There is no distension or abdominal bruit.     Palpations: Abdomen is soft. There is no mass.     Tenderness: There is no abdominal tenderness.     Hernia: No hernia is present.  Genitourinary:    Comments: Breast exam: No mass, nodules, thickening, tenderness, bulging, retraction, inflamation, nipple discharge or skin changes noted.  No axillary or clavicular LA.     Musculoskeletal:        General: No tenderness. Normal range of motion.     Cervical back: Normal range of motion and neck supple. No rigidity. No muscular tenderness.     Right lower leg: No edema.     Left lower leg: No edema.     Comments: No kyphosis   Lymphadenopathy:     Cervical: No cervical adenopathy.  Skin:    General: Skin is warm and dry.  Coloration: Skin is not pale.     Findings: No erythema or rash.     Comments: Solar lentigines diffusely   Neurological:     Mental Status: She is alert. Mental status is at baseline.     Cranial Nerves: No cranial nerve deficit.     Motor: No abnormal muscle tone.     Coordination: Coordination normal.     Gait: Gait normal.     Deep Tendon Reflexes: Reflexes are normal and symmetric.  Psychiatric:        Mood and Affect: Mood normal.        Cognition and Memory: Cognition and memory normal.           Assessment & Plan:   Problem List Items Addressed This Visit       Cardiovascular and Mediastinum   Essential hypertension    bp in fair control at this time  BP Readings from Last 1 Encounters:  04/15/23 124/64  No changes needed Most recent labs reviewed  Disc lifstyle change with low sodium diet and exercise   Amlodipine 5 mg daily  Fosinopril 20 mg daily  Lasix 20 mg twice weekly       Atrial fibrillation and flutter (HCC)    Seeing cardiology  On amiodarone Planning a visit with EP soon  Continues anticoag with xarelto       (HFpEF) heart failure with  preserved ejection fraction (HCC)    Clinically stable Reviewed last cardiology note On ace          Endocrine   Subclinical hypothyroidism    Lab Results  Component Value Date   TSH 1.61 04/10/2023  FT4 in normal range Will continue to watch No clinical changes         Musculoskeletal and Integument   Osteopenia    Ast dexa 2019 New one ordered-pt will call to schedule No falls or fracture Taking vti D3  Disc need for calcium/ vitamin D/ wt bearing exercise and bone density test every 2 y to monitor Disc safety/ fracture risk in detail  Discussed fall prevention         Other   STRESS REACTION, ACUTE, WITH EMOTIONAL DISTURBANCE    PHQ is elevated  Pt declines counseling or additional treatment Continues paxil      Routine general medical examination at a health care facility - Primary    Reviewed health habits including diet and exercise and skin cancer prevention Reviewed appropriate screening tests for age  Also reviewed health mt list, fam hx and immunization status , as well as social and family history   See HPI Labs reviewed and ordered Mammogram ordered for mobile unit-pt will call to schedule Cologuard ordered  Dexa ordered / no falls or fracture/ encouraged strength building exercise  PHQ is  high-pt declines intervention/ treatment at this time       Prediabetes    Lab Results  Component Value Date   HGBA1C 6.2 04/10/2023  disc imp of low glycemic diet and wt loss to prevent DM2       Morbid obesity (HCC)    Discussed how this problem influences overall health and the risks it imposes  Reviewed plan for weight loss with lower calorie diet (via better food choices and also portion control or program like weight watchers) and exercise building up to or more than 30 minutes 5 days per week including some aerobic activity   Discussed making small steps like cutting sweets gradually and adding  strength building exercise       Iron deficiency  anemia    Pt plans to get back on mvi with iron , level of 42 (low normal)      Estrogen deficiency    Dexa ordered      Relevant Orders   DG Bone Density   Encounter for screening mammogram for breast cancer    Mammogram ordered for mobile unit Pt will call to schedule       Relevant Orders   MM 3D SCREENING MAMMOGRAM BILATERAL BREAST   Colon cancer screening    Cologuard ordered      Relevant Orders   Cologuard

## 2023-04-15 NOTE — Assessment & Plan Note (Signed)
bp in fair control at this time  BP Readings from Last 1 Encounters:  04/15/23 124/64   No changes needed Most recent labs reviewed  Disc lifstyle change with low sodium diet and exercise   Amlodipine 5 mg daily  Fosinopril 20 mg daily  Lasix 20 mg twice weekly

## 2023-04-15 NOTE — Assessment & Plan Note (Signed)
Lab Results  Component Value Date   HGBA1C 6.2 04/10/2023   disc imp of low glycemic diet and wt loss to prevent DM2

## 2023-04-15 NOTE — Assessment & Plan Note (Signed)
Reviewed health habits including diet and exercise and skin cancer prevention Reviewed appropriate screening tests for age  Also reviewed health mt list, fam hx and immunization status , as well as social and family history   See HPI Labs reviewed and ordered Mammogram ordered for mobile unit-pt will call to schedule Cologuard ordered  Dexa ordered / no falls or fracture/ encouraged strength building exercise  PHQ is  high-pt declines intervention/ treatment at this time

## 2023-04-15 NOTE — Assessment & Plan Note (Signed)
Seeing cardiology  On amiodarone Planning a visit with EP soon  Continues anticoag with xarelto

## 2023-04-15 NOTE — Assessment & Plan Note (Signed)
Discussed how this problem influences overall health and the risks it imposes  Reviewed plan for weight loss with lower calorie diet (via better food choices and also portion control or program like weight watchers) and exercise building up to or more than 30 minutes 5 days per week including some aerobic activity   Discussed making small steps like cutting sweets gradually and adding strength building exercise

## 2023-04-15 NOTE — Assessment & Plan Note (Signed)
Pt plans to get back on mvi with iron , level of 42 (low normal)

## 2023-04-26 DIAGNOSIS — Z1211 Encounter for screening for malignant neoplasm of colon: Secondary | ICD-10-CM | POA: Diagnosis not present

## 2023-04-30 ENCOUNTER — Encounter: Payer: Self-pay | Admitting: Cardiology

## 2023-04-30 ENCOUNTER — Telehealth: Payer: Self-pay | Admitting: Cardiology

## 2023-04-30 ENCOUNTER — Ambulatory Visit: Payer: Medicare HMO | Attending: Cardiology | Admitting: Cardiology

## 2023-04-30 VITALS — BP 138/60 | HR 70 | Ht 59.5 in | Wt 209.5 lb

## 2023-04-30 DIAGNOSIS — I5030 Unspecified diastolic (congestive) heart failure: Secondary | ICD-10-CM

## 2023-04-30 DIAGNOSIS — I4819 Other persistent atrial fibrillation: Secondary | ICD-10-CM | POA: Diagnosis not present

## 2023-04-30 DIAGNOSIS — Z79899 Other long term (current) drug therapy: Secondary | ICD-10-CM | POA: Diagnosis not present

## 2023-04-30 DIAGNOSIS — I4892 Unspecified atrial flutter: Secondary | ICD-10-CM | POA: Diagnosis not present

## 2023-04-30 NOTE — Patient Instructions (Addendum)
Medication Instructions:  Your physician has recommended you make the following change in your medication:  1) DECREASE amiodarone to 200 mg once daily  *If you need a refill on your cardiac medications before your next appointment, please call your pharmacy*  Lab Work: BMET and CBC prior to ablation  You will get your lab work at [facility]) hospital.  Your lab work will be done at [facility] next to [employer].  These are walk in labs- you will not need an appointment and you do not need to be fasting.    Testing/Procedures: Your physician has recommended that you have a sleep study. This test records several body functions during sleep, including: brain activity, eye movement, oxygen and carbon dioxide blood levels, heart rate and rhythm, breathing rate and rhythm, the flow of air through your mouth and nose, snoring, body muscle movements, and chest and belly movement.  Your physician has requested that you have cardiac CT. Cardiac computed tomography (CT) is a painless test that uses an x-ray machine to take clear, detailed pictures of your heart. For further information please visit [email or web address] This will be done about one week prior to your ablation. We will call you to schedule.  Your physician has recommended that you have an ablation. Catheter ablation is a medical procedure used to treat some cardiac arrhythmias (irregular heartbeats). During catheter ablation, a long, thin, flexible tube is put into a blood vessel in your groin (upper thigh), or neck. This tube is called an ablation catheter. It is then guided to your heart through the blood vessel. Radio frequency waves destroy small areas of heart tissue where abnormal heartbeats may cause an arrhythmia to start.  You are scheduled for Atrial Fibrillation Ablation on Tuesday, October 15 with Dr. Steffanie Dunn.Please arrive at the Main Entrance A at [facility]: [address] [place],  [place] [address] at 8:30 AM   Follow-Up: At [facility], you and your health needs are our priority.  As part of our continuing mission to provide you with exceptional heart care, we have created designated Provider Care Teams.  These Care Teams include your primary Cardiologist (physician) and Advanced Practice Providers (APPs -  Physician Assistants and Nurse Practitioners) who all work together to provide you with the care you need, when you need it.  We will call you to schedule your follow up appointments.

## 2023-04-30 NOTE — Addendum Note (Signed)
Addended by: Frutoso Schatz on: 04/30/2023 11:48 AM   Modules accepted: Orders

## 2023-04-30 NOTE — Telephone Encounter (Signed)
See MyChart message

## 2023-04-30 NOTE — Telephone Encounter (Signed)
Patient states she is returning a call to [place], Charity fundraiser.

## 2023-04-30 NOTE — Progress Notes (Signed)
Electrophysiology Office Note:    Date:  04/30/2023   ID:  DREYAH MINKLER, DOB 12/16/1945, MRN [id number]  [facility] Cardiologist:  None  [facility] Electrophysiologist:  Lanier Prude, MD   Referring MD: Antonieta Iba, MD   Chief Complaint: Atrial fibrillation and flutter  History of Present Illness:    Kerry Simpson is a 77 y.o. female who I am seeing today for an evaluation of atrial fibrillation and flutter at the request of Dr. Mariah Milling.  The patient last saw Dr. Mariah Milling Mar 17, 2023.  Her medical history includes obesity, hypertension, GERD, atrial fibrillation, sick sinus syndrome.  She has been maintained on amiodarone for her atrial fibrillation.  Her amiodarone therapy started May 2017.  She is highly symptomatic while in atrial fibrillation with shortness of breath with exertion.  She feels that she cannot keep up with her colleagues that she normally would.  She is known amiodarone since 2017 with periodic increases in her dosing.  Most recently she has been on 200 mg mouth twice daily and has been maintained on that dose regimen.  She takes Xarelto for stroke prophylaxis.  She reports snoring at night and thinks that she may have sleep apnea.  She is been hesitant to have a sleep study in the past given concerns about the masks used for treating sleep apnea.      Their past medical, social and family history was reveiwed.   ROS:   Please see the history of present illness.    All other systems reviewed and are negative.  EKGs/Labs/Other Studies Reviewed:    The following studies were reviewed today:  October 26, 2022 echo EF 60-65 RV normal No MR  Mar 17, 2023 EKG shows sinus rhythm October 11, 2022 EKG shows atrial flutter.  EKG:  The ekg ordered today demonstrates sinus rhythm   Physical Exam:    VS:  BP 138/60 (BP Location: Left Arm, Patient Position: Sitting, Cuff Size: Large)   Pulse 70   Ht 4' 11.5" (1.511 m)   Wt 209 lb 8 oz (95 kg)    SpO2 97%   BMI 41.61 kg/m     Wt Readings from Last 3 Encounters:  04/30/23 209 lb 8 oz (95 kg)  04/15/23 205 lb (93 kg)  03/17/23 212 lb 4 oz (96.3 kg)     GEN:  Well nourished, well developed in no acute distress.  Obese CARDIAC: RRR, no murmurs, rubs, gallops RESPIRATORY:  Clear to auscultation without rales, wheezing or rhonchi       ASSESSMENT AND PLAN:    1. Persistent atrial fibrillation (HCC)   2. Atrial flutter, unspecified type (HCC)   3. Heart failure with preserved ejection fraction, unspecified HF chronicity (HCC)   4. Encounter for long-term (current) use of high-risk medication     #Persistent atrial fibrillation flutter #High risk med monitoring-amiodarone Symptomatic Has been on amiodarone since 2017 I discussed treatment options with the patient including continued antiarrhythmic use, alternative antiarrhythmic or catheter ablation.  I do think catheter ablation provides an opportunity to control her rhythm while avoiding long-term exposure to amiodarone.  To start we will decrease her amiodarone to 200 mg by mouth once daily.  The goal be to stop this medication 3 months after the catheter ablation.  Apr 10, 2023 lab work shows an AST 21, ALT 18, TSH 1.61.  Discussed treatment options today for AF including antiarrhythmic drug therapy and ablation. Discussed risks, recovery and likelihood of success  with each treatment strategy. Risk, benefits, and alternatives to EP study and radiofrequency ablation for afib were discussed. These risks include but are not limited to stroke, bleeding, vascular damage, tamponade, perforation, damage to the esophagus, lungs, phrenic nerve and other structures, pulmonary vein stenosis, worsening renal function, and death.  Discussed potential need for repeat ablation procedures and antiarrhythmic drugs after an initial ablation. The patient understands these risk and wishes to proceed.  We will therefore proceed with catheter  ablation at the next available time.  Carto, ICE, anesthesia are requested for the procedure.  Will also obtain CT PV protocol prior to the procedure to exclude LAA thrombus and further evaluate atrial anatomy.  Ablation strategy will be PVI plus CTI +/- posterior wall.   #Chronic diastolic heart failure NYHA class II.  Warm and dry on exam.  Rhythm control indicated as above.  #Suspected sleep disordered breathing Home sleep study.  We discussed the link between untreated sleep apnea and atrial fibrillation treatment success rates.       Signed, Rossie Muskrat. Lalla Brothers, MD, [facility], [facility] 04/30/2023 11:43 AM    Electrophysiology [facility]

## 2023-05-03 LAB — COLOGUARD: COLOGUARD: POSITIVE — AB

## 2023-05-04 ENCOUNTER — Encounter: Payer: Self-pay | Admitting: Family Medicine

## 2023-05-04 DIAGNOSIS — R195 Other fecal abnormalities: Secondary | ICD-10-CM

## 2023-05-05 ENCOUNTER — Other Ambulatory Visit: Payer: Self-pay | Admitting: Cardiovascular Disease

## 2023-05-05 DIAGNOSIS — R195 Other fecal abnormalities: Secondary | ICD-10-CM | POA: Insufficient documentation

## 2023-05-05 NOTE — Telephone Encounter (Signed)
Prescription refill request for Xarelto received.  Indication: A Fib/Flutter Last office visit: 04/30/23  Jeanie Cooks MD Weight: 95kg Age: 77 Scr: 0.96 on 04/10/23  Epic CrCl: 73.60  Based on above findings Xarelto 20mg  daily is the appropriate dose,.  Refill approved.

## 2023-05-05 NOTE — Telephone Encounter (Signed)
Please review

## 2023-05-07 ENCOUNTER — Telehealth: Payer: Self-pay

## 2023-05-07 NOTE — Telephone Encounter (Signed)
Gastroenterology Pre-Procedure Review  Request Date: TBD Requesting Physician: Dr. Jodelle Gross  PATIENT REVIEW QUESTIONS: The patient responded to the following health history questions as indicated:    1. Are you having any GI issues? yes (positive cologuard (Pt said she may have been still taking her beet root supp), bloated, stools have become more loose, stool color normal to dark) 2. Do you have a personal history of Polyps? no pts last colonoscopy 2015 was normal 3. Do you have a family history of Colon Cancer or Polyps? no 4. Diabetes Mellitus? no 5. Joint replacements in the past 12 months? Gel insertion in December in her knee 6. Major health problems in the past 3 months?no 7. Any artificial heart valves, MVP, or defibrillator?no 8. Cardiac health? Yes Atrial Fib, and Heart Failure    MEDICATIONS & ALLERGIES:    Patient reports the following regarding taking any anticoagulation/antiplatelet therapy:   Plavix, Coumadin, Eliquis, Xarelto, Lovenox, Pradaxa, Brilinta, or Effient? Yes pt takes Xarelto 20mg  Aspirin? no  Patient confirms/reports the following medications:  Current Outpatient Medications  Medication Sig Dispense Refill   acetaminophen (TYLENOL) 500 MG tablet Take 1,000 mg by mouth daily as needed.      amiodarone (PACERONE) 200 MG tablet TAKE ONE TABLET BY MOUTH ONCE A DAY 90 tablet 2   amLODipine (NORVASC) 5 MG tablet TAKE ONE TABLET BY MOUTH ONCE A DAY 90 tablet 3   fosinopril (MONOPRIL) 20 MG tablet TAKE ONE TABLET BY MOUTH ONCE DAILY 90 tablet 0   furosemide (LASIX) 20 MG tablet Take 1 tablet (20 mg total) by mouth as directed. Take 1 tablet (20 mg) once a day for 3 days then take as needed. 90 tablet 3   melatonin 5 MG TABS Take 10 mg by mouth at bedtime as needed.     Misc Natural Products (YUMVS BEET ROOT-TART CHERRY PO) Take 1 tablet by mouth daily.     Multiple Vitamin (MULTIVITAMIN) capsule Take 1 capsule by mouth daily.     omeprazole (PRILOSEC) 20 MG capsule  TAKE ONE CAPSULE BY MOUTH ONCE DAILY 90 capsule 0   PARoxetine (PAXIL) 20 MG tablet TAKE 1 TABLET BY MOUTH ONCE A DAY 90 tablet 3   XARELTO 20 MG TABS tablet TAKE ONE TABLET BY MOUTH ONCE A DAY WITH SUPPER 90 tablet 1   No current facility-administered medications for this visit.    Patient confirms/reports the following allergies:  No Known Allergies  No orders of the defined types were placed in this encounter.   AUTHORIZATION INFORMATION Primary Insurance: 1D#: Group #:  Secondary Insurance: 1D#: Group #:  SCHEDULE INFORMATION: Date:  Time: Location:

## 2023-05-09 ENCOUNTER — Ambulatory Visit
Admission: RE | Admit: 2023-05-09 | Discharge: 2023-05-09 | Disposition: A | Discharge status: Home / Self Care | Payer: Medicare HMO | Source: Ambulatory Visit | Attending: Family Medicine | Admitting: Family Medicine

## 2023-05-09 DIAGNOSIS — Z1231 Encounter for screening mammogram for malignant neoplasm of breast: Secondary | ICD-10-CM | POA: Diagnosis not present

## 2023-05-14 ENCOUNTER — Telehealth: Payer: Self-pay | Admitting: *Deleted

## 2023-05-14 NOTE — Telephone Encounter (Addendum)
Prior Authorization for HST sent to [employer] via web portal. Tracking Number .  NO PA REQ

## 2023-05-16 NOTE — Telephone Encounter (Signed)
I spoke with the patient and advised her that we heard back from our precert department stating she did not require an authorization from her insurance to proceed with her home sleep test.  I inquired if the patient was given a WatchPAT home sleep test at her office visit to hold on to until we could her insurance status and she advised she was not.  I advised the patient I will follow up with Dr. Lovena Neighbours nurse to ensure she is getting the correct test and we will have Carly, RN reach out to her next week to confirm.  The patient voices understanding and is agreeable.

## 2023-05-16 NOTE — Telephone Encounter (Signed)
Will forward to [facility] office as Dr. Lalla Brothers in [place] ordered sleep study.

## 2023-05-16 NOTE — Telephone Encounter (Signed)
Attempted to contact the patient. No answer- I left a message to please call back.  

## 2023-05-19 NOTE — Telephone Encounter (Signed)
Frutoso Schatz, RN  Sent: Mon May 19, 2023  8:36 AM  To: Jefferey Pica, RN         Message  Nehemiah Massed, if I remember correctly she did not have a smart phone so cannot complete the Donnie Coffin studies that we provide. She will need to pick up a study from the sleep lab.

## 2023-05-19 NOTE — Telephone Encounter (Signed)
Nina/ Okey Regal- see note below from [place].   Can you please advise the patient where she should pick up her home sleep test from and cannot do an Itamar test.  Thank you!

## 2023-05-20 ENCOUNTER — Encounter: Payer: Self-pay | Admitting: Cardiology

## 2023-05-20 DIAGNOSIS — I4819 Other persistent atrial fibrillation: Secondary | ICD-10-CM

## 2023-05-20 DIAGNOSIS — G479 Sleep disorder, unspecified: Secondary | ICD-10-CM

## 2023-05-20 NOTE — Telephone Encounter (Signed)
See Mychart encounter. Patient has been referred to pulmonary for a sleep study. Home sleep study order has been cancelled.

## 2023-05-20 NOTE — Telephone Encounter (Signed)
Spoke with the patient and confirmed that she does not have a smart phone with bluetooth capabilities. Explained that our office in [place] only provides sleep studies that require a phone with bluetooth and that she would need to come to the sleep center in [place] to pick up a home sleep study. I gave her the option of being referred to pulmonary in [place] to complete a sleep study through them. She states that she would prefer to go through pulmonary so that she does not have to come to [place]. I will cancel home sleep study order and refer her to pulmonary.

## 2023-05-20 NOTE — Telephone Encounter (Signed)
Heather, I will forward to [employer]. CMA in regard to your notes about sleep study. I see per your notes pt cannot do Itamar study.

## 2023-05-26 ENCOUNTER — Other Ambulatory Visit: Payer: Self-pay | Admitting: Family Medicine

## 2023-06-16 ENCOUNTER — Other Ambulatory Visit: Payer: Self-pay | Admitting: Family Medicine

## 2023-06-16 ENCOUNTER — Ambulatory Visit: Payer: Medicare HMO | Admitting: Physician Assistant

## 2023-07-02 ENCOUNTER — Other Ambulatory Visit: Payer: Self-pay | Admitting: Family Medicine

## 2023-07-09 ENCOUNTER — Encounter: Payer: Self-pay | Admitting: Cardiology

## 2023-08-26 ENCOUNTER — Encounter: Payer: Self-pay | Admitting: Cardiovascular Disease

## 2023-09-01 ENCOUNTER — Other Ambulatory Visit: Payer: Medicare HMO

## 2023-09-05 ENCOUNTER — Ambulatory Visit (HOSPITAL_COMMUNITY): Admit: 2023-09-05 | Payer: Medicare HMO | Admitting: Cardiology

## 2023-09-05 ENCOUNTER — Encounter (HOSPITAL_COMMUNITY): Payer: Self-pay

## 2023-09-05 SURGERY — ATRIAL FIBRILLATION ABLATION
Anesthesia: General

## 2023-10-21 ENCOUNTER — Other Ambulatory Visit: Payer: Self-pay | Admitting: Cardiovascular Disease

## 2023-10-21 NOTE — Telephone Encounter (Signed)
Prescription refill request for Xarelto received.  Indication: A Fib/Flutter Last office visit:  04/30/23  Jeanie Cooks MD Weight: 95kg Age: 77 Scr: 0.96 on 04/10/23 CrCl: 78  Based on above findings Xarelto 20mg  daily is the appropriate dose.  Refill approved.

## 2023-12-08 ENCOUNTER — Other Ambulatory Visit: Payer: Self-pay | Admitting: Family Medicine

## 2024-01-29 DIAGNOSIS — H524 Presbyopia: Secondary | ICD-10-CM | POA: Diagnosis not present

## 2024-01-29 DIAGNOSIS — H3561 Retinal hemorrhage, right eye: Secondary | ICD-10-CM | POA: Diagnosis not present

## 2024-01-29 DIAGNOSIS — H35033 Hypertensive retinopathy, bilateral: Secondary | ICD-10-CM | POA: Diagnosis not present

## 2024-01-29 DIAGNOSIS — I10 Essential (primary) hypertension: Secondary | ICD-10-CM | POA: Diagnosis not present

## 2024-01-29 DIAGNOSIS — Z961 Presence of intraocular lens: Secondary | ICD-10-CM | POA: Diagnosis not present

## 2024-01-29 DIAGNOSIS — Z0100 Encounter for examination of eyes and vision without abnormal findings: Secondary | ICD-10-CM | POA: Diagnosis not present

## 2024-01-29 DIAGNOSIS — H1849 Other corneal degeneration: Secondary | ICD-10-CM | POA: Diagnosis not present

## 2024-01-31 ENCOUNTER — Other Ambulatory Visit: Payer: Self-pay | Admitting: Family Medicine

## 2024-02-04 ENCOUNTER — Telehealth: Payer: Self-pay | Admitting: *Deleted

## 2024-02-04 NOTE — Telephone Encounter (Signed)
 Per Dr. Milinda Antis:  Eye doctor would like her to follow up with me to re test for diabetes based on eye issue  Please schedule follow up  Thanks

## 2024-02-04 NOTE — Telephone Encounter (Signed)
 LVM for patient to c/b and schedule.

## 2024-02-05 NOTE — Telephone Encounter (Signed)
 Patient scheduled her annual physical with Dr. Milinda Antis. She stated that the issue with her eye doctor isn't much to worry about and she will wait until than. Thank you!

## 2024-02-05 NOTE — Telephone Encounter (Signed)
 CPE is on 04/16/24, FYI to PCP

## 2024-03-03 ENCOUNTER — Other Ambulatory Visit: Payer: Self-pay | Admitting: Family Medicine

## 2024-03-29 ENCOUNTER — Telehealth: Payer: Self-pay | Admitting: Family Medicine

## 2024-03-29 DIAGNOSIS — R7303 Prediabetes: Secondary | ICD-10-CM

## 2024-03-29 DIAGNOSIS — Z79899 Other long term (current) drug therapy: Secondary | ICD-10-CM | POA: Insufficient documentation

## 2024-03-29 DIAGNOSIS — D509 Iron deficiency anemia, unspecified: Secondary | ICD-10-CM

## 2024-03-29 DIAGNOSIS — E038 Other specified hypothyroidism: Secondary | ICD-10-CM

## 2024-03-29 DIAGNOSIS — I10 Essential (primary) hypertension: Secondary | ICD-10-CM

## 2024-03-29 NOTE — Telephone Encounter (Signed)
-----   Message from Gerry Krone sent at 03/26/2024  4:06 PM EDT ----- Regarding: Lab orders for, Fri, 5.30.25 Patient is scheduled for CPX labs, please order future labs, Thanks , [name]

## 2024-04-09 ENCOUNTER — Other Ambulatory Visit

## 2024-04-12 ENCOUNTER — Other Ambulatory Visit (INDEPENDENT_AMBULATORY_CARE_PROVIDER_SITE_OTHER)

## 2024-04-12 ENCOUNTER — Other Ambulatory Visit: Payer: Self-pay | Admitting: Cardiovascular Disease

## 2024-04-12 ENCOUNTER — Ambulatory Visit: Payer: Self-pay | Admitting: Family Medicine

## 2024-04-12 DIAGNOSIS — E038 Other specified hypothyroidism: Secondary | ICD-10-CM

## 2024-04-12 DIAGNOSIS — R7303 Prediabetes: Secondary | ICD-10-CM

## 2024-04-12 DIAGNOSIS — I10 Essential (primary) hypertension: Secondary | ICD-10-CM

## 2024-04-12 DIAGNOSIS — D509 Iron deficiency anemia, unspecified: Secondary | ICD-10-CM

## 2024-04-12 DIAGNOSIS — Z79899 Other long term (current) drug therapy: Secondary | ICD-10-CM | POA: Diagnosis not present

## 2024-04-12 LAB — CBC WITH DIFFERENTIAL/PLATELET
Basophils Absolute: 0 10*3/uL (ref 0.0–0.1)
Basophils Relative: 0.6 % (ref 0.0–3.0)
Eosinophils Absolute: 0.1 10*3/uL (ref 0.0–0.7)
Eosinophils Relative: 2.2 % (ref 0.0–5.0)
HCT: 42.3 % (ref 36.0–46.0)
Hemoglobin: 13.7 g/dL (ref 12.0–15.0)
Lymphocytes Relative: 24.4 % (ref 12.0–46.0)
Lymphs Abs: 1.5 10*3/uL (ref 0.7–4.0)
MCHC: 32.5 g/dL (ref 30.0–36.0)
MCV: 85.6 fl (ref 78.0–100.0)
Monocytes Absolute: 0.4 10*3/uL (ref 0.1–1.0)
Monocytes Relative: 7.1 % (ref 3.0–12.0)
Neutro Abs: 3.9 10*3/uL (ref 1.4–7.7)
Neutrophils Relative %: 65.7 % (ref 43.0–77.0)
Platelets: 252 10*3/uL (ref 150.0–400.0)
RBC: 4.94 Mil/uL (ref 3.87–5.11)
RDW: 16.2 % — ABNORMAL HIGH (ref 11.5–15.5)
WBC: 6 10*3/uL (ref 4.0–10.5)

## 2024-04-12 LAB — COMPREHENSIVE METABOLIC PANEL WITH GFR
ALT: 14 U/L (ref 0–35)
AST: 16 U/L (ref 0–37)
Albumin: 4.2 g/dL (ref 3.5–5.2)
Alkaline Phosphatase: 76 U/L (ref 39–117)
BUN: 16 mg/dL (ref 6–23)
CO2: 29 meq/L (ref 19–32)
Calcium: 8.9 mg/dL (ref 8.4–10.5)
Chloride: 103 meq/L (ref 96–112)
Creatinine, Ser: 0.83 mg/dL (ref 0.40–1.20)
GFR: 67.38 mL/min (ref >60.00)
Glucose, Bld: 100 mg/dL — ABNORMAL HIGH (ref 70–99)
Potassium: 4 meq/L (ref 3.5–5.1)
Sodium: 140 meq/L (ref 135–145)
Total Bilirubin: 0.5 mg/dL (ref 0.2–1.2)
Total Protein: 6.5 g/dL (ref 6.0–8.3)

## 2024-04-12 LAB — LIPID PANEL
Cholesterol: 150 mg/dL (ref 0–200)
HDL: 51.3 mg/dL (ref >39.00)
LDL Cholesterol: 74 mg/dL (ref 0–99)
NonHDL: 98.39
Total CHOL/HDL Ratio: 3
Triglycerides: 123 mg/dL (ref 0.0–149.0)
VLDL: 24.6 mg/dL (ref 0.0–40.0)

## 2024-04-12 LAB — IRON: Iron: 57 ug/dL (ref 42–145)

## 2024-04-12 LAB — VITAMIN B12: Vitamin B-12: 276 pg/mL (ref 211–911)

## 2024-04-12 LAB — HEMOGLOBIN A1C: Hgb A1c MFr Bld: 6 % (ref 4.6–6.5)

## 2024-04-12 LAB — FERRITIN: Ferritin: 12 ng/mL (ref 10.0–291.0)

## 2024-04-12 LAB — T4, FREE: Free T4: 1.34 ng/dL (ref 0.60–1.60)

## 2024-04-12 LAB — TSH: TSH: 1.17 u[IU]/mL (ref 0.35–5.50)

## 2024-04-16 ENCOUNTER — Ambulatory Visit (INDEPENDENT_AMBULATORY_CARE_PROVIDER_SITE_OTHER): Admitting: Family Medicine

## 2024-04-16 ENCOUNTER — Encounter: Payer: Self-pay | Admitting: Family Medicine

## 2024-04-16 VITALS — BP 132/75 | HR 68 | Temp 98.3°F | Ht 59.0 in | Wt 192.5 lb

## 2024-04-16 DIAGNOSIS — Z79899 Other long term (current) drug therapy: Secondary | ICD-10-CM

## 2024-04-16 DIAGNOSIS — Z0000 Encounter for general adult medical examination without abnormal findings: Secondary | ICD-10-CM

## 2024-04-16 DIAGNOSIS — Z1231 Encounter for screening mammogram for malignant neoplasm of breast: Secondary | ICD-10-CM

## 2024-04-16 DIAGNOSIS — D509 Iron deficiency anemia, unspecified: Secondary | ICD-10-CM | POA: Diagnosis not present

## 2024-04-16 DIAGNOSIS — L989 Disorder of the skin and subcutaneous tissue, unspecified: Secondary | ICD-10-CM

## 2024-04-16 DIAGNOSIS — M85852 Other specified disorders of bone density and structure, left thigh: Secondary | ICD-10-CM

## 2024-04-16 DIAGNOSIS — K219 Gastro-esophageal reflux disease without esophagitis: Secondary | ICD-10-CM

## 2024-04-16 DIAGNOSIS — I5030 Unspecified diastolic (congestive) heart failure: Secondary | ICD-10-CM

## 2024-04-16 DIAGNOSIS — R4589 Other symptoms and signs involving emotional state: Secondary | ICD-10-CM

## 2024-04-16 DIAGNOSIS — E038 Other specified hypothyroidism: Secondary | ICD-10-CM

## 2024-04-16 DIAGNOSIS — R7303 Prediabetes: Secondary | ICD-10-CM

## 2024-04-16 DIAGNOSIS — I4891 Unspecified atrial fibrillation: Secondary | ICD-10-CM

## 2024-04-16 DIAGNOSIS — T462X5S Adverse effect of other antidysrhythmic drugs, sequela: Secondary | ICD-10-CM

## 2024-04-16 DIAGNOSIS — I10 Essential (primary) hypertension: Secondary | ICD-10-CM | POA: Diagnosis not present

## 2024-04-16 DIAGNOSIS — I4892 Unspecified atrial flutter: Secondary | ICD-10-CM

## 2024-04-16 DIAGNOSIS — Z1211 Encounter for screening for malignant neoplasm of colon: Secondary | ICD-10-CM

## 2024-04-16 DIAGNOSIS — E2839 Other primary ovarian failure: Secondary | ICD-10-CM

## 2024-04-16 NOTE — Progress Notes (Signed)
 Subjective:    Patient ID: Kerry Simpson, female    DOB: 07-17-1946, 78 y.o.   MRN: [id number]  HPI  Here for health maintenance exam and to review chronic medical problems   Wt Readings from Last 3 Encounters:  04/16/24 192 lb 8 oz (87.3 kg)  04/30/23 209 lb 8 oz (95 kg)  04/15/23 205 lb (93 kg)   38.88 kg/m  Vitals:   04/16/24 1126 04/16/24 1158  BP: (!) 140/78 132/75  Pulse: 68   Temp: 98.3 F (36.8 C)   SpO2: 94%     Immunization History  Administered Date(s) Administered   Fluad Quad(high Dose 65+) 08/24/2020   Influenza Split 08/01/2011, 01/08/2012   Influenza Whole 09/25/2007, 08/22/2008, 10/12/2009, 07/11/2010, 08/18/2012   Influenza, High Dose Seasonal PF 06/25/2019   Influenza,inj,Quad PF,6+ Mos 10/13/2015, 08/20/2016, 07/23/2018   Influenza-Unspecified 09/22/2017   PFIZER(Purple Top)SARS-COV-2 Vaccination 11/30/2019, 12/22/2019, 08/25/2020, 03/05/2021   Pneumococcal Conjugate-13 04/18/2014   Pneumococcal Polysaccharide-23 09/28/2010   Tdap 11/07/2021   Zoster Recombinant(Shingrix) 11/07/2021, 01/31/2022   Zoster, Live 11/12/2013    Health Maintenance Due  Topic Date Due   Medicare Annual Wellness (AWV)  04/14/2024     Mammogram 04/2023 - does not have it scheduled yet / wants to use mobile unit  Self breast exam- no lumps or changes   Gyn health-no problems    Colon cancer screening  colonoscopy 09/2014  Had positive cologuard 04/2023  Decided against any further screening  Declines colonoscopy even with the positive test   Bone health  Dexa 06/2018 osteopenia   Falls-none  Fractures-none  Supplements -none  Last vitamin D  Lab Results  Component Value Date   VD25OH 38 08/25/2015    Exercise :  None  Has trouble walking  Limited     Mood    04/16/2024   12:12 PM 04/15/2023    8:26 AM 03/28/2022    4:01 PM 06/30/2020   11:24 AM 05/26/2019   10:35 AM  Depression screen PHQ 2/9  Decreased Interest 1 2 0 0 0  Down, Depressed,  Hopeless 1 2 0 0 0  PHQ - 2 Score 2 4 0 0 0  Altered sleeping 0 2     Tired, decreased energy 3 2     Change in appetite 1 2     Feeling bad or failure about yourself  1 3     Trouble concentrating 0 2     Moving slowly or fidgety/restless 0 3     Suicidal thoughts 0 0     PHQ-9 Score 7 18     Difficult doing work/chores Somewhat difficult Somewhat difficult     Paxil  20 mg daily  Struggles with mood -good and bad days  Handling it ok  Declines counseling or change in medicine   HTN in setting of a fib and HFpEF  Almost had ablation -she changed her mind -did well for a while  HR goes up and down   Pulse Readings from Last 3 Encounters:  04/16/24 68  04/30/23 70  04/15/23 72     bp is stable today  No cp or palpitations or headaches or edema  No side effects to medicines  BP Readings from Last 3 Encounters:  04/16/24 132/75  04/30/23 138/60  04/15/23 124/64   Amlodipine  5 mg daily  Fosinopril  20 mg daily  Lasix  20 mg twice weekly   Blood pressure has not been a problem  Checks at home   Amiodarone   Xarelto     Lab Results  Component Value Date   NA 140 04/12/2024   K 4.0 04/12/2024   CO2 29 04/12/2024   GLUCOSE 100 (H) 04/12/2024   BUN 16 04/12/2024   CREATININE 0.83 04/12/2024   CALCIUM 8.9 04/12/2024   GFR 67.38 04/12/2024   EGFR 56 (L) 07/20/2021   GFRNONAA 57 (L) 10/10/2022    Continues omeprazole  20 mg daily for GERD Lab Results  Component Value Date   VITAMINB12 276 04/12/2024   History of subclinical hypothyroid Graves in past  Lab Results  Component Value Date   TSH 1.17 04/12/2024  Normal TSH today  Taking amiodarone  200 mg daily   Lab Results  Component Value Date   WBC 6.0 04/12/2024   HGB 13.7 04/12/2024   HCT 42.3 04/12/2024   MCV 85.6 04/12/2024   PLT 252.0 04/12/2024   Lab Results  Component Value Date   IRON 57 04/12/2024   FERRITIN 12.0 04/12/2024   Prediabetes Lab Results  Component Value Date   HGBA1C 6.0  04/12/2024   HGBA1C 6.2 04/10/2023   HGBA1C 6.2 06/28/2020   Lab Results  Component Value Date   ALT 14 04/12/2024   AST 16 04/12/2024   ALKPHOS 76 04/12/2024   BILITOT 0.5 04/12/2024     Cholesterol Lab Results  Component Value Date   CHOL 150 04/12/2024   CHOL 147 04/10/2023   CHOL 141 06/28/2020   Lab Results  Component Value Date   HDL 51.30 04/12/2024   HDL 54.50 04/10/2023   HDL 53.80 06/28/2020   Lab Results  Component Value Date   LDLCALC 74 04/12/2024   LDLCALC 71 04/10/2023   LDLCALC 67 06/28/2020   Lab Results  Component Value Date   TRIG 123.0 04/12/2024   TRIG 104.0 04/10/2023   TRIG 102.0 06/28/2020   Lab Results  Component Value Date   CHOLHDL 3 04/12/2024   CHOLHDL 3 04/10/2023   CHOLHDL 3 06/28/2020   No results found for: "LDLDIRECT" No meds    Patient Active Problem List   Diagnosis Date Noted   Current use of proton pump inhibitor 03/29/2024   Positive colorectal cancer screening using Cologuard test 05/05/2023   Colon cancer screening 04/15/2023   Encounter for screening mammogram for breast cancer 04/15/2023   Adverse effect of amiodarone  04/10/2022   Skin lesion of left leg 04/10/2022   Mass of soft tissue of lower leg 04/10/2022   Left knee pain 07/04/2021   Prediabetes 05/23/2019   Iron deficiency anemia 05/23/2018   Screening mammogram, encounter for 05/20/2018   (HFpEF) heart failure with preserved ejection fraction (HCC) 05/19/2017   GERD (gastroesophageal reflux disease) 08/20/2016   DOE (dyspnea on exertion) 05/28/2016   Sleep disturbance 05/28/2016   Somnolence, daytime 05/09/2016   Encounter for anticoagulation discussion and counseling 02/15/2016   Atrial fibrillation and flutter (HCC) 02/14/2016   Routine general medical examination at a health care facility 10/13/2015   Estrogen deficiency 10/13/2015   History of melena 12/12/2014   Gallstone 12/07/2014   Encounter for routine gynecological examination  04/14/2013   STRESS REACTION, ACUTE, WITH EMOTIONAL DISTURBANCE 11/26/2010   Osteopenia 06/21/2009   Subclinical hypothyroidism 04/26/2008   Morbid obesity (HCC) 04/26/2008   Essential hypertension 04/26/2008   Past Medical History:  Diagnosis Date   Anemia    iron def   Atrial fibrillation (HCC)    Dr Jerelene Monday   Cataract 05/13/2018   both eyes per last vision exam    Complication  of anesthesia    "quit breathing" -Endoscopy( 12-22-14)   Difficult intubation    Pt reports unaware of difficulty   Fibrocystic breast    Fracture of right shoulder    Gallstones    Grave's disease    Graves disease    Hypertension    Obesity    Osteoporosis    osteopenia   Past Surgical History:  Procedure Laterality Date   CATARACT EXTRACTION W/PHACO Right 09/02/2022   Procedure: CATARACT EXTRACTION PHACO AND INTRAOCULAR LENS PLACEMENT (IOC) RIGHT;  Surgeon: Rosa College, MD;  Location: [facility];  Service: Ophthalmology;  Laterality: Right;  8.31 0:49.5   CATARACT EXTRACTION W/PHACO Left 09/16/2022   Procedure: CATARACT EXTRACTION PHACO AND INTRAOCULAR LENS PLACEMENT (IOC) LEFT;  Surgeon: Rosa College, MD;  Location: [facility];  Service: Ophthalmology;  Laterality: Left;  13.18 1:10.5   COLONOSCOPY  09/29/2014   Normal, Joshua Nieves, MD   ELECTROPHYSIOLOGIC STUDY N/A 04/12/2016   Procedure: CARDIOVERSION;  Surgeon: Devorah Fonder, MD;  Location: [facility];  Service: Cardiovascular;  Laterality: N/A;   ESOPHAGOGASTRODUODENOSCOPY (EGD) WITH PROPOFOL  N/A 09/08/2018   Procedure: ESOPHAGOGASTRODUODENOSCOPY (EGD) WITH PROPOFOL ;  Surgeon: Deveron Fly, MD;  Location: [facility];  Service: Endoscopy;  Laterality: N/A;   ESOPHAGOGASTRODUODENOSCOPY ENDOSCOPY     EUS N/A 01/12/2015   Procedure: UPPER ENDOSCOPIC ULTRASOUND (EUS) LINEAR;  Surgeon: Janel Medford, MD;  Location: [facility];  Service: Endoscopy;  Laterality: N/A;   TUBAL LIGATION     Social History    Tobacco Use   Smoking status: Never    Passive exposure: Never   Smokeless tobacco: Never  Vaping Use   Vaping status: Never Used  Substance Use Topics   Alcohol use: No    Alcohol/week: 0.0 standard drinks of alcohol   Drug use: No   Family History  Problem Relation Age of Onset   Hypertension Mother    Diabetes Father    Hypertension Father    Hypertension Brother    Cancer Brother        prostate   Heart failure Brother    Heart disease Brother        with bypass   Colon cancer Neg Hx    Breast cancer Neg Hx    No Known Allergies Current Outpatient Medications on File Prior to Visit  Medication Sig Dispense Refill   acetaminophen (TYLENOL) 500 MG tablet Take 1,000 mg by mouth daily as needed.      amiodarone  (PACERONE ) 200 MG tablet TAKE ONE TABLET BY MOUTH ONCE A DAY 90 tablet 3   amLODipine  (NORVASC ) 5 MG tablet TAKE ONE TABLET BY MOUTH ONCE A DAY 30 tablet 0   BIOTIN PO Take 2 tablets by mouth in the morning, at noon, and at bedtime. WITH COLLAGEN     fosinopril  (MONOPRIL ) 20 MG tablet TAKE ONE TABLET BY MOUTH ONCE DAILY 90 tablet 0   melatonin 5 MG TABS Take 10 mg by mouth at bedtime as needed.     Misc Natural Products (YUMVS BEET ROOT-TART CHERRY PO) Take 1 tablet by mouth daily.     Multiple Vitamin (MULTIVITAMIN) capsule Take 1 capsule by mouth daily.     omeprazole  (PRILOSEC) 20 MG capsule TAKE ONE CAPSULE BY MOUTH ONCE DAILY 90 capsule 0   PARoxetine  (PAXIL ) 20 MG tablet TAKE ONE TABLET BY MOUTH ONCE A DAY 90 tablet 1   TURMERIC PO Take 1 capsule by mouth daily.     XARELTO   20 MG TABS tablet TAKE ONE TABLET BY MOUTH ONCE A DAY WITH SUPPER 90 tablet 1   furosemide  (LASIX ) 20 MG tablet Take 1 tablet (20 mg total) by mouth as directed. Take 1 tablet (20 mg) once a day for 3 days then take as needed. 90 tablet 3   No current facility-administered medications on file prior to visit.    Review of Systems  Constitutional:  Positive for fatigue. Negative for  activity change, appetite change, fever and unexpected weight change.  HENT:  Negative for congestion, ear pain, rhinorrhea, sinus pressure and sore throat.   Eyes:  Negative for pain, redness and visual disturbance.  Respiratory:  Negative for cough, shortness of breath and wheezing.   Cardiovascular:  Negative for chest pain and palpitations.  Gastrointestinal:  Negative for abdominal pain, blood in stool, constipation and diarrhea.  Endocrine: Negative for polydipsia and polyuria.  Genitourinary:  Negative for dysuria, frequency and urgency.  Musculoskeletal:  Negative for arthralgias, back pain and myalgias.  Skin:  Negative for pallor and rash.  Allergic/Immunologic: Negative for environmental allergies.  Neurological:  Negative for dizziness, syncope and headaches.  Hematological:  Negative for adenopathy. Does not bruise/bleed easily.  Psychiatric/Behavioral:  Positive for dysphoric mood. Negative for decreased concentration. The patient is not nervous/anxious.        Objective:   Physical Exam Constitutional:      General: She is not in acute distress.    Appearance: Normal appearance. She is well-developed. She is obese. She is not ill-appearing or diaphoretic.  HENT:     Head: Normocephalic and atraumatic.     Right Ear: Tympanic membrane, ear canal and external ear normal.     Left Ear: Tympanic membrane, ear canal and external ear normal.     Nose: Nose normal. No congestion.     Mouth/Throat:     Mouth: Mucous membranes are moist.     Pharynx: Oropharynx is clear. No posterior oropharyngeal erythema.  Eyes:     General: No scleral icterus.    Extraocular Movements: Extraocular movements intact.     Conjunctiva/sclera: Conjunctivae normal.     Pupils: Pupils are equal, round, and reactive to light.  Neck:     Thyroid : No thyromegaly.     Vascular: No carotid bruit or JVD.  Cardiovascular:     Rate and Rhythm: Normal rate and regular rhythm.     Pulses: Normal  pulses.     Heart sounds: Normal heart sounds.     No gallop.  Pulmonary:     Effort: Pulmonary effort is normal. No respiratory distress.     Breath sounds: Normal breath sounds. No wheezing.     Comments: Good air exch Chest:     Chest wall: No tenderness.  Abdominal:     General: Bowel sounds are normal. There is no distension or abdominal bruit.     Palpations: Abdomen is soft. There is no mass.     Tenderness: There is no abdominal tenderness.     Hernia: No hernia is present.  Genitourinary:    Comments: Breast exam: No mass, nodules, thickening, tenderness, bulging, retraction, inflamation, nipple discharge or skin changes noted.  No axillary or clavicular LA.     Musculoskeletal:        General: No tenderness. Normal range of motion.     Cervical back: Normal range of motion and neck supple. No rigidity. No muscular tenderness.     Right lower leg: No edema.  Left lower leg: No edema.     Comments: No kyphosis   Lymphadenopathy:     Cervical: No cervical adenopathy.  Skin:    General: Skin is warm and dry.     Coloration: Skin is not pale.     Findings: No erythema or rash.     Comments: Scattered angiomas-red on trunk   LLE   1-2 cm blue raised lesion consistent with old angioma   Solar lentigines diffusely Solar aging  Neurological:     Mental Status: She is alert. Mental status is at baseline.     Cranial Nerves: No cranial nerve deficit.     Motor: No abnormal muscle tone.     Coordination: Coordination normal.     Gait: Gait normal.     Deep Tendon Reflexes: Reflexes are normal and symmetric. Reflexes normal.  Psychiatric:        Mood and Affect: Mood normal.        Cognition and Memory: Cognition and memory normal.           Assessment & Plan:   Problem List Items Addressed This Visit       Cardiovascular and Mediastinum   Essential hypertension   bp in fair control at this time  BP Readings from Last 1 Encounters:  04/16/24 132/75   No  changes needed Most recent labs reviewed  Disc lifstyle change with low sodium diet and exercise   Amlodipine  5 mg daily  Fosinopril  20 mg daily  Lasix  20 mg twice weekly       Atrial fibrillation and flutter (HCC)   Per pt has HR ups and downs  Did not end up doing ablation   Today pulse of 68 Continues amiodarone  and xarelto        (HFpEF) heart failure with preserved ejection fraction (HCC)   Clinically stable  Continues cardiology care   Ace Lasix          Digestive   GERD (gastroesophageal reflux disease)   Continues omeprazole  20 mg daily  Unable to stop   Lab Results  Component Value Date   VITAMINB12 276 04/12/2024           Endocrine   Subclinical hypothyroidism   Normal TSH today  Lab Results  Component Value Date   TSH 1.17 04/12/2024   No clinical changes         Musculoskeletal and Integument   Skin lesion of left leg   Per pt -had it removed and grew back  Suspect angioma Plans to reach out to dermatology      Osteopenia   Dexa ordered No falls or fracture Encouraged to get back on ca and D   Discussed fall prevention, supplements and exercise for bone density  Discussed chair exercise programs         Other   STRESS REACTION, ACUTE, WITH EMOTIONAL DISTURBANCE   Routine general medical examination at a health care facility - Primary   Reviewed health habits including diet and exercise and skin cancer prevention Reviewed appropriate screening tests for age  Also reviewed health mt list, fam hx and immunization status , as well as social and family history   See HPI Labs reviewed and ordered Health Maintenance  Topic Date Due   Medicare Annual Wellness Visit  04/14/2024   COVID-19 Vaccine (5 - 2024-25 season) 05/02/2025*   Mammogram  05/08/2024   Flu Shot  06/11/2024   DTaP/Tdap/Td vaccine (2 - Td or Tdap) 11/08/2031   Pneumonia  Vaccine  Completed   DEXA scan (bone density measurement)  Completed   Hepatitis C Screening   Completed   Zoster (Shingles) Vaccine  Completed   HPV Vaccine  Aged Out   Meningitis B Vaccine  Aged Out   Colon Cancer Screening  Discontinued  *Topic was postponed. The date shown is not the original due date.    Dexa and mammogram ordered  Discussed fall prevention, supplements and exercise for bone density  Declines follow up for positive cologuard  PHQ 7 , with treatment/ declines further treament or counseling       Prediabetes   Prediabetes  Lab Results  Component Value Date   HGBA1C 6.0 04/12/2024   HGBA1C 6.2 04/10/2023   HGBA1C 6.2 06/28/2020    disc imp of low glycemic diet and wt loss to prevent DM2        Morbid obesity (HCC)   Bmi 38.8 with co morbidities   Discussed how this problem influences overall health and the risks it imposes  Reviewed plan for weight loss with lower calorie diet (via better food choices (lower glycemic and portion control) along with exercise building up to or more than 30 minutes 5 days per week including some aerobic activity and strength training         Iron deficiency anemia   Cbc and iron are normal today      Estrogen deficiency   Dexa ordered       Relevant Orders   DG Bone Density   Encounter for screening mammogram for breast cancer   Mammogram ordered Pt will call to schedule       Relevant Orders   MM 3D SCREENING MAMMOGRAM BILATERAL BREAST   Current use of proton pump inhibitor   Lab Results  Component Value Date   VITAMINB12 276 04/12/2024   Last vitamin D  Lab Results  Component Value Date   VD25OH 38 08/25/2015   Encouraged strongly to continue supplementation       Colon cancer screening   Pt had positive cologuard 04/2023  Declined colonoscopy or any further screening after that  She is on anticoagulation  Voiced understanding that she may have polyp or cancer given that positive result   Otherwise over 75 would not ordinarily screen with colonoscopy       Adverse effect of amiodarone     Doing well overall Normal tsh Lab Results  Component Value Date   TSH 1.17 04/12/2024

## 2024-04-16 NOTE — Assessment & Plan Note (Signed)
 Lab Results  Component Value Date   VITAMINB12 276 04/12/2024   Last vitamin D  Lab Results  Component Value Date   VD25OH 38 08/25/2015   Encouraged strongly to continue supplementation

## 2024-04-16 NOTE — Assessment & Plan Note (Signed)
 Per pt -had it removed and grew back  Suspect angioma Plans to reach out to dermatology

## 2024-04-16 NOTE — Assessment & Plan Note (Signed)
 Dexa ordered No falls or fracture Encouraged to get back on ca and D   Discussed fall prevention, supplements and exercise for bone density  Discussed chair exercise programs

## 2024-04-16 NOTE — Assessment & Plan Note (Addendum)
 Clinically stable  Continues cardiology care   Ace Lasix 

## 2024-04-16 NOTE — Assessment & Plan Note (Signed)
 Continues omeprazole  20 mg daily  Unable to stop   Lab Results  Component Value Date   VITAMINB12 276 04/12/2024

## 2024-04-16 NOTE — Assessment & Plan Note (Signed)
 Per pt has HR ups and downs  Did not end up doing ablation   Today pulse of 68 Continues amiodarone  and xarelto 

## 2024-04-16 NOTE — Patient Instructions (Addendum)
 Please start on Kerry Simpson and Kerry Simpson for bone health Try to get 1200-1500 mg of calcium per day with at least 2000 iu of vitamin Kerry Simpson  - for bone health   A chair program would be great for exercise for you  Add some strength training to your routine, this is important for bone and brain health and can reduce your risk of falls and help your body use insulin properly and regulate weight  Light weights, exercise bands , and internet videos are a good way to start  Yoga (chair or regular), machines , floor exercises or a gym with machines are also good options     You have an order for:  [x]   3D Mammogram  [x]   Bone Density   Call [facility] [place] to schedule mobile mammogram Call [facility] to schedule bone density     Please call for appointment:   [x]   [facility]  [address] [place] [address]  [phone]  []   [facility]   [address]  [place], [place] [address]  [phone]  [x]   [facility]      [address] [place], [place]        [phone]         []   [facility]  [address] [place], [place]  [phone]  []  [facility]   [address]   [place], [place] [address]  [phone]  []  [facility]  [address] [place], [place] [address] [phone]    Make sure to wear two piece clothing  No lotions powders or deodorants the day of the appointment Make sure to bring picture ID and insurance card.  Bring list of medications you are currently taking including any supplements.   Schedule your screening mammogram through MyChart!   Select [place] imaging sites can now be scheduled through MyChart.  Log into your MyChart account.  Go to 'Visit' (or 'Appointments' if  on mobile  App) --> Schedule an  Appointment  Under 'Select a Reason for Visit' choose the Mammogram  Screening option.  Complete the pre-visit questions  and select the time and place that  best fits your schedule

## 2024-04-16 NOTE — Assessment & Plan Note (Signed)
 bp in fair control at this time  BP Readings from Last 1 Encounters:  04/16/24 132/75   No changes needed Most recent labs reviewed  Disc lifstyle change with low sodium diet and exercise   Amlodipine  5 mg daily  Fosinopril  20 mg daily  Lasix  20 mg twice weekly

## 2024-04-16 NOTE — Assessment & Plan Note (Signed)
 Doing well overall Normal tsh Lab Results  Component Value Date   TSH 1.17 04/12/2024

## 2024-04-16 NOTE — Assessment & Plan Note (Signed)
 Normal TSH today  Lab Results  Component Value Date   TSH 1.17 04/12/2024   No clinical changes

## 2024-04-18 NOTE — Assessment & Plan Note (Addendum)
 Reviewed health habits including diet and exercise and skin cancer prevention Reviewed appropriate screening tests for age  Also reviewed health mt list, fam hx and immunization status , as well as social and family history   See HPI Labs reviewed and ordered Health Maintenance  Topic Date Due   Medicare Annual Wellness Visit  04/14/2024   COVID-19 Vaccine (5 - 2024-25 season) 05/02/2025*   Mammogram  05/08/2024   Flu Shot  06/11/2024   DTaP/Tdap/Td vaccine (2 - Td or Tdap) 11/08/2031   Pneumonia Vaccine  Completed   DEXA scan (bone density measurement)  Completed   Hepatitis C Screening  Completed   Zoster (Shingles) Vaccine  Completed   HPV Vaccine  Aged Out   Meningitis B Vaccine  Aged Out   Colon Cancer Screening  Discontinued  *Topic was postponed. The date shown is not the original due date.    Dexa and mammogram ordered  Discussed fall prevention, supplements and exercise for bone density  Declines follow up for positive cologuard  PHQ 7 , with treatment/ declines further treament or counseling

## 2024-04-18 NOTE — Assessment & Plan Note (Addendum)
 Pt had positive cologuard 04/2023  Declined colonoscopy or any further screening after that  She is on anticoagulation  Voiced understanding that she may have polyp or cancer given that positive result   Otherwise over 75 would not ordinarily screen with colonoscopy

## 2024-04-18 NOTE — Assessment & Plan Note (Signed)
 Prediabetes  Lab Results  Component Value Date   HGBA1C 6.0 04/12/2024   HGBA1C 6.2 04/10/2023   HGBA1C 6.2 06/28/2020    disc imp of low glycemic diet and wt loss to prevent DM2

## 2024-04-18 NOTE — Assessment & Plan Note (Signed)
Mammogram ordered °Pt will call to schedule  °

## 2024-04-18 NOTE — Assessment & Plan Note (Signed)
 Bmi 38.8 with co morbidities   Discussed how this problem influences overall health and the risks it imposes  Reviewed plan for weight loss with lower calorie diet (via better food choices (lower glycemic and portion control) along with exercise building up to or more than 30 minutes 5 days per week including some aerobic activity and strength training

## 2024-04-18 NOTE — Assessment & Plan Note (Signed)
 Cbc and iron are normal today

## 2024-04-18 NOTE — Assessment & Plan Note (Signed)
 Dexa ordered

## 2024-05-01 ENCOUNTER — Other Ambulatory Visit: Payer: Self-pay | Admitting: Cardiovascular Disease

## 2024-05-03 NOTE — Telephone Encounter (Signed)
 Prescription refill request for Xarelto  received.  Indication: AF Last office visit: 04/30/23  JAYSON Holts MD Weight: 95kg Age: 78 Scr: 0.83 on 04/12/24  Epic CrCl: 83.78  Based on above findings Xarelto  20mg  daily is the appropriate dose.  Refill approved. Pt is due for MD appt.  Message sent to schedulers.

## 2024-05-15 ENCOUNTER — Other Ambulatory Visit: Payer: Self-pay | Admitting: Cardiovascular Disease

## 2024-05-17 ENCOUNTER — Ambulatory Visit
Admission: RE | Admit: 2024-05-17 | Discharge: 2024-05-17 | Disposition: A | Discharge status: Home / Self Care | Source: Ambulatory Visit | Attending: Family Medicine | Admitting: Family Medicine

## 2024-05-17 DIAGNOSIS — Z1231 Encounter for screening mammogram for malignant neoplasm of breast: Secondary | ICD-10-CM | POA: Diagnosis not present

## 2024-05-19 NOTE — Telephone Encounter (Signed)
Pt is scheduled on 8/18.

## 2024-05-20 ENCOUNTER — Ambulatory Visit: Payer: Self-pay | Admitting: Family Medicine

## 2024-05-26 ENCOUNTER — Other Ambulatory Visit: Payer: Self-pay | Admitting: Family Medicine

## 2024-06-04 ENCOUNTER — Other Ambulatory Visit: Payer: Self-pay | Admitting: Family Medicine

## 2024-06-14 ENCOUNTER — Other Ambulatory Visit: Payer: Self-pay | Admitting: Cardiovascular Disease

## 2024-06-26 NOTE — Progress Notes (Unsigned)
 Cardiology Office Note  Date:  06/28/2024   ID:  Kerry Simpson, DOB March 05, 1946, MRN [id number]  PCP:  Randeen Laine LABOR, MD   Chief Complaint  Patient presents with   Follow-up    Doing well.     HPI:  [name] is a pleasant 78 year-old woman with history of  obesity,  hypertension,  GERD  Paroxysmal atrial fibrillation, on amiodarone  Bradycardia, sick sinus syndrome on beta-blockers Snoring, previously canceled sleep study who presents for routine follow-up for persistent atrial fibrillation  Last seen by myself in clinic 5/24  Seen by Dr. Cindie June 2024 Seen in preparation for ablation Amiodarone  dosing reduced down to 200 daily in preparation Cancelled ablation in follow up as she reports atrial fibrillation was relatively well-controlled  In follow-up today she reports that she has had several episodes of afib in past few months Takes extra amiodarone  for paroxysmal atrial fibrillation  Sedentary at baseline, limited by arthritic pain in her knees  Denies significant shortness of breath or chest pain  Stress at home, husband with medical issues Sepsis, sciatica end of 2024, slow to recover  Seen by one of our providers 11/23 Was in atrial fibrillation, amiodarone  increased to 200 bid for 7 days EKG 10/11/22: atrial flutter She cancelled EKG and DCCV 12/23 as she was feeling better  Labs reviewed with her in detail HBA1C 6.0 totaL CHOL 150,  Ldl 74 TSH normal from 6/25  EKG personally reviewed by myself on todays visit EKG Interpretation Date/Time:  Monday June 28 2024 09:14:06 EDT Ventricular Rate:  67 PR Interval:  212 QRS Duration:  88 QT Interval:  450 QTC Calculation: 475 R Axis:   -39  Text Interpretation: Sinus rhythm with 1st degree A-V block Left axis deviation Possible Anterior infarct , age undetermined When compared with ECG of 12-Apr-2016 07:13, PACs have resolved Confirmed by Perla Lye [phone]) on 06/28/2024 9:25:03 AM     Other past medical history reviewed Echo 12/23 EF  60-65%  Started on amio 03/2016: CHADS-VASc score is greater than or equal to 3 (hypertension, age, gender)   Seen by dr. Fernande 04/2016:  Tachy/brady digoxin  held sleep disordered breathing and evidence of HFpEF.  Does not think she has OSA Cancelled sleep study Previous cardioversion cancelled, she converted on her own on amio   Echocardiogram  showing normal LV function, mildly elevated right heart pressures    PMH:   has a past medical history of Anemia, Atrial fibrillation (HCC), Cataract (05/13/2018), Complication of anesthesia, Difficult intubation, Fibrocystic breast, Fracture of right shoulder, Gallstones, Grave's disease, Graves disease, Hypertension, Obesity, and Osteoporosis.  PSH:    Past Surgical History:  Procedure Laterality Date   CATARACT EXTRACTION W/PHACO Right 09/02/2022   Procedure: CATARACT EXTRACTION PHACO AND INTRAOCULAR LENS PLACEMENT (IOC) RIGHT;  Surgeon: Myrna Adine Anes, MD;  Location: [facility];  Service: Ophthalmology;  Laterality: Right;  8.31 0:49.5   CATARACT EXTRACTION W/PHACO Left 09/16/2022   Procedure: CATARACT EXTRACTION PHACO AND INTRAOCULAR LENS PLACEMENT (IOC) LEFT;  Surgeon: Myrna Adine Anes, MD;  Location: [facility];  Service: Ophthalmology;  Laterality: Left;  13.18 1:10.5   COLONOSCOPY  09/29/2014   Normal, Kerry Nida, MD   ELECTROPHYSIOLOGIC STUDY N/A 04/12/2016   Procedure: CARDIOVERSION;  Surgeon: Lye JINNY Perla, MD;  Location: [facility] ORS;  Service: Cardiovascular;  Laterality: N/A;   ESOPHAGOGASTRODUODENOSCOPY (EGD) WITH PROPOFOL  N/A 09/08/2018   Procedure: ESOPHAGOGASTRODUODENOSCOPY (EGD) WITH PROPOFOL ;  Surgeon: Gaylyn Gladis PENNER, MD;  Location: [facility]  ENDOSCOPY;  Service: Endoscopy;  Laterality: N/A;   ESOPHAGOGASTRODUODENOSCOPY ENDOSCOPY     EUS N/A 01/12/2015   Procedure: UPPER ENDOSCOPIC ULTRASOUND (EUS) LINEAR;  Surgeon: Toribio SHAUNNA Cedar, MD;   Location: [facility] ENDOSCOPY;  Service: Endoscopy;  Laterality: N/A;   TUBAL LIGATION      Current Outpatient Medications  Medication Sig Dispense Refill   acetaminophen (TYLENOL) 500 MG tablet Take 1,000 mg by mouth daily as needed.      amiodarone  (PACERONE ) 200 MG tablet TAKE ONE TABLET BY MOUTH ONCE A DAY 90 tablet 3   amLODipine  (NORVASC ) 5 MG tablet Take 1 tablet (5 mg total) by mouth daily. KEEP OV. 30 tablet 2   BIOTIN PO Take 2 tablets by mouth in the morning, at noon, and at bedtime. WITH COLLAGEN     Calcium 250 MG CAPS Take by mouth daily.     cholecalciferol (VITAMIN D3) 25 MCG (1000 UNIT) tablet Take 1,000 Units by mouth daily.     Collagen-Vitamin C-Biotin (COLLAGEN PO) Take by mouth daily.     fosinopril  (MONOPRIL ) 20 MG tablet TAKE ONE TABLET BY MOUTH ONCE DAILY 90 tablet 0   furosemide  (LASIX ) 20 MG tablet Take 1 tablet (20 mg total) by mouth as directed. Take 1 tablet (20 mg) once a day for 3 days then take as needed. 90 tablet 3   Ginkgo Biloba (GINKOBA PO) Take by mouth daily.     Glucosamine-Chondroitin (GLUCOSAMINE CHONDR COMPLEX PO) Take by mouth daily.     melatonin 5 MG TABS Take 10 mg by mouth at bedtime as needed.     Misc Natural Products (YUMVS BEET ROOT-TART CHERRY PO) Take 1 tablet by mouth daily.     Multiple Vitamin (MULTIVITAMIN) capsule Take 1 capsule by mouth daily.     omeprazole  (PRILOSEC) 20 MG capsule TAKE ONE CAPSULE BY MOUTH ONCE DAILY 90 capsule 1   PARoxetine  (PAXIL ) 20 MG tablet TAKE ONE TABLET BY MOUTH ONCE A DAY 90 tablet 1   TURMERIC PO Take 1 capsule by mouth daily.     XARELTO  20 MG TABS tablet TAKE ONE TABLET BY MOUTH ONCE A DAY WITH SUPPER 90 tablet 1   No current facility-administered medications for this visit.    Allergies:   Patient has no known allergies.   Social History:  The patient  reports that she has never smoked. She has never been exposed to tobacco smoke. She has never used smokeless tobacco. She reports that she does not  drink alcohol and does not use drugs.   Family History:   family history includes Cancer in her brother; Diabetes in her father; Heart disease in her brother; Heart failure in her brother; Hypertension in her brother, father, and mother.    Review of Systems: Review of Systems  Constitutional: Negative.   HENT: Negative.    Respiratory: Negative.    Cardiovascular: Negative.   Gastrointestinal: Negative.   Musculoskeletal: Negative.   Neurological: Negative.   Psychiatric/Behavioral: Negative.    All other systems reviewed and are negative.   PHYSICAL EXAM: VS:  BP (!) 140/70 (BP Location: Left Arm, Patient Position: Sitting, Cuff Size: Normal)   Pulse 67   Ht 4' 11 (1.499 m)   Wt 199 lb 4 oz (90.4 kg)   SpO2 95%   BMI 40.24 kg/m  , BMI Body mass index is 40.24 kg/m. Constitutional:  oriented to person, place, and time. No distress.  HENT:  Head: Grossly normal Eyes:  no discharge. No scleral  icterus.  Neck: No JVD, no carotid bruits  Cardiovascular: Regular rate and rhythm, no murmurs appreciated Pulmonary/Chest: Clear to auscultation bilaterally, no wheezes or rales Abdominal: Soft.  no distension.  no tenderness.  Musculoskeletal: Normal range of motion Neurological:  normal muscle tone. Coordination normal. No atrophy Skin: Skin warm and dry Psychiatric: normal affect, pleasant  Recent Labs: 04/12/2024: ALT 14; BUN 16; Creatinine, Ser 0.83; Hemoglobin 13.7; Platelets 252.0; Potassium 4.0; Sodium 140; TSH 1.17    Lipid Panel Lab Results  Component Value Date   CHOL 150 04/12/2024   HDL 51.30 04/12/2024   LDLCALC 74 04/12/2024   TRIG 123.0 04/12/2024    Wt Readings from Last 3 Encounters:  06/28/24 199 lb 4 oz (90.4 kg)  04/16/24 192 lb 8 oz (87.3 kg)  04/30/23 209 lb 8 oz (95 kg)     ASSESSMENT AND PLAN:  Essential hypertension - Blood pressure borderline elevated, recommend close monitoring at home Continue fosinopril , amlodipine  at current  dosing Takes Lasix  as needed  Atrial fibrillation and flutter (HCC) -  Previously seen by EP, canceled ablation given rare episodes Continues to have rare episodes, takes amiodarone  200 daily with extra amiodarone  as needed Maintaining normal sinus rhythm Normal TSH May be exacerbated by sleep disordered breathing, sleep apnea, obesity, previously canceled sleep study  Class 2 obesity due to excess calories without serious comorbidity with body mass index (BMI) of 37.0 to 37.9 in adult We have encouraged continued exercise, careful diet management  DOE (dyspnea on exertion) Recommend regular walking program, carbohydrate restriction  Gait instability Limited by joint pain, scheduled to see orthopedics  Chest pain, unspecified type No recent chest pain symptoms  Sleep disturbance Reports sleeping relatively well, snoring Previously declined sleep study   Orders Placed This Encounter  Procedures   EKG 12-Lead     Signed, Velinda Lunger, M.D., Ph.D. 06/28/2024  [facility] [place], [place] [phone]

## 2024-06-28 ENCOUNTER — Ambulatory Visit: Attending: Cardiovascular Disease | Admitting: Cardiovascular Disease

## 2024-06-28 ENCOUNTER — Encounter: Payer: Self-pay | Admitting: Cardiovascular Disease

## 2024-06-28 VITALS — BP 140/70 | HR 67 | Ht 59.0 in | Wt 199.2 lb

## 2024-06-28 DIAGNOSIS — R7303 Prediabetes: Secondary | ICD-10-CM

## 2024-06-28 DIAGNOSIS — I10 Essential (primary) hypertension: Secondary | ICD-10-CM | POA: Diagnosis not present

## 2024-06-28 DIAGNOSIS — I4892 Unspecified atrial flutter: Secondary | ICD-10-CM | POA: Diagnosis not present

## 2024-06-28 DIAGNOSIS — I4819 Other persistent atrial fibrillation: Secondary | ICD-10-CM

## 2024-06-28 DIAGNOSIS — G479 Sleep disorder, unspecified: Secondary | ICD-10-CM

## 2024-06-28 DIAGNOSIS — I5030 Unspecified diastolic (congestive) heart failure: Secondary | ICD-10-CM | POA: Diagnosis not present

## 2024-06-28 MED ORDER — FUROSEMIDE 20 MG PO TABS: 20.0000 mg | ORAL_TABLET | Freq: Every day | ORAL | 3 refills | Status: AC | PRN

## 2024-06-28 MED ORDER — FOSINOPRIL SODIUM 20 MG PO TABS: 20.0000 mg | ORAL_TABLET | Freq: Every day | ORAL | 3 refills | Status: AC

## 2024-06-28 MED ORDER — AMIODARONE HCL 200 MG PO TABS: 200.0000 mg | ORAL_TABLET | Freq: Every day | ORAL | 3 refills | Status: AC

## 2024-06-28 NOTE — Patient Instructions (Signed)

## 2024-06-30 DIAGNOSIS — M170 Bilateral primary osteoarthritis of knee: Secondary | ICD-10-CM | POA: Diagnosis not present

## 2024-07-08 ENCOUNTER — Ambulatory Visit: Admitting: Dermatology

## 2024-07-08 ENCOUNTER — Encounter: Payer: Self-pay | Admitting: Dermatology

## 2024-07-08 DIAGNOSIS — L820 Inflamed seborrheic keratosis: Secondary | ICD-10-CM

## 2024-07-08 DIAGNOSIS — D492 Neoplasm of unspecified behavior of bone, soft tissue, and skin: Secondary | ICD-10-CM

## 2024-07-08 DIAGNOSIS — D2372 Other benign neoplasm of skin of left lower limb, including hip: Secondary | ICD-10-CM

## 2024-07-08 DIAGNOSIS — L821 Other seborrheic keratosis: Secondary | ICD-10-CM

## 2024-07-08 DIAGNOSIS — D485 Neoplasm of uncertain behavior of skin: Secondary | ICD-10-CM | POA: Diagnosis not present

## 2024-07-08 MED ORDER — MUPIROCIN 2 % EX OINT: TOPICAL_OINTMENT | CUTANEOUS | 1 refills | Status: DC

## 2024-07-08 NOTE — Patient Instructions (Addendum)
 Cryotherapy Aftercare  Wash gently with soap and water everyday.   Apply Vaseline and Band-Aid daily until healed.   Wound Care Instructions  Cleanse wound gently with soap and water once a day then pat dry with clean gauze. Apply a thin coat of Petrolatum (petroleum jelly, Vaseline) over the wound (unless you have an allergy to this). We recommend that you use a new, sterile tube of Vaseline. Do not pick or remove scabs. Do not remove the yellow or white healing tissue from the base of the wound.  Cover the wound with fresh, clean, nonstick gauze and secure with paper tape. You may use Band-Aids in place of gauze and tape if the wound is small enough, but would recommend trimming much of the tape off as there is often too much. Sometimes Band-Aids can irritate the skin.  You should call the office for your biopsy report after 1 week if you have not already been contacted.  If you experience any problems, such as abnormal amounts of bleeding, swelling, significant bruising, significant pain, or evidence of infection, please call the office immediately.  FOR ADULT SURGERY PATIENTS: If you need something for pain relief you may take 1 extra strength Tylenol  (acetaminophen ) AND 2 Ibuprofen (200mg  each) together every 4 hours as needed for pain. (do not take these if you are allergic to them or if you have a reason you should not take them.) Typically, you may only need pain medication for 1 to 3 days.     Due to recent changes in healthcare laws, you may see results of your pathology and/or laboratory studies on MyChart before the doctors have had a chance to review them. We understand that in some cases there may be results that are confusing or concerning to you. Please understand that not all results are received at the same time and often the doctors may need to interpret multiple results in order to provide you with the best plan of care or course of treatment. Therefore, we ask that you  please give us  2 business days to thoroughly review all your results before contacting the office for clarification. Should we see a critical lab result, you will be contacted sooner.   If You Need Anything After Your Visit  If you have any questions or concerns for your doctor, please call our main line at [phone] and press option 4 to reach your doctor's medical assistant. If no one answers, please leave a voicemail as directed and we will return your call as soon as possible. Messages left after 4 pm will be answered the following business day.   You may also send us  a message via MyChart. We typically respond to MyChart messages within 1-2 business days.  For prescription refills, please ask your pharmacy to contact our office. Our fax number is [phone].  If you have an urgent issue when the clinic is closed that cannot wait until the next business day, you can page your doctor at the number below.    Please note that while we do our best to be available for urgent issues outside of office hours, we are not available 24/7.   If you have an urgent issue and are unable to reach us , you may choose to seek medical care at your doctor's office, retail clinic, urgent care center, or emergency room.  If you have a medical emergency, please immediately call 911 or go to the emergency department.  Pager Numbers  - Dr. Hester: [phone]  -  Dr. Jackquline: [phone]  - Dr. Claudene: [phone]   In the event of inclement weather, please call our main line at [phone] for an update on the status of any delays or closures.  Dermatology Medication Tips: Please keep the boxes that topical medications come in in order to help keep track of the instructions about where and how to use these. Pharmacies typically print the medication instructions only on the boxes and not directly on the medication tubes.   If your medication is too expensive, please contact our office at  [phone] option 4 or send us  a message through MyChart.   We are unable to tell what your co-pay for medications will be in advance as this is different depending on your insurance coverage. However, we may be able to find a substitute medication at lower cost or fill out paperwork to get insurance to cover a needed medication.   If a prior authorization is required to get your medication covered by your insurance company, please allow us  1-2 business days to complete this process.  Drug prices often vary depending on where the prescription is filled and some pharmacies may offer cheaper prices.  The website www.goodrx.com contains coupons for medications through different pharmacies. The prices here do not account for what the cost may be with help from insurance (it may be cheaper with your insurance), but the website can give you the price if you did not use any insurance.  - You can print the associated coupon and take it with your prescription to the pharmacy.  - You may also stop by our office during regular business hours and pick up a GoodRx coupon card.  - If you need your prescription sent electronically to a different pharmacy, notify our office through [facility] or by phone at [phone] option 4.     Si Usted Necesita Algo Despus de Su Visita  Tambin puede enviarnos un mensaje a travs de Clinical cytogeneticist. Por lo general respondemos a los mensajes de MyChart en el transcurso de 1 a 2 das hbiles.  Para renovar recetas, por favor pida a su farmacia que se ponga en contacto con nuestra oficina. Randi lakes de fax es [place] [phone].  Si tiene un asunto urgente cuando la clnica est cerrada y que no puede esperar hasta el siguiente da hbil, puede llamar/localizar a su doctor(a) al nmero que aparece a continuacin.   Por favor, tenga en cuenta que aunque hacemos todo lo posible para estar disponibles para asuntos urgentes fuera del horario de [place], no estamos  disponibles las 24 horas del da, los 7 [address]  [address] de la [place].   Si tiene un problema urgente y no puede comunicarse con nosotros, puede optar por buscar atencin mdica  en el consultorio de su doctor(a), en una clnica privada, en un centro de atencin urgente o en una sala de emergencias.  Si tiene Engineer, drilling, por favor llame inmediatamente al 911 o vaya a la sala de emergencias.  Nmeros de bper  - Dr. Hester: [phone]  - Dra. Jackquline: [phone]  - Dr. Claudene: [phone]   En caso de inclemencias del tiempo, por favor llame a landry capes principal al [phone] para una actualizacin sobre el [place] de cualquier retraso o cierre.  Consejos para la medicacin en dermatologa: Por favor, guarde las cajas en las que vienen los medicamentos de uso tpico para ayudarle a seguir las instrucciones sobre dnde y cmo usarlos. Las farmacias generalmente imprimen las instrucciones del medicamento slo en las  cajas y no directamente en los tubos del medicamento.   Si su medicamento es muy caro, por favor, pngase en contacto con landry rieger llamando al [phone] y presione la opcin 4 o envenos un mensaje a travs de Clinical cytogeneticist.   No podemos decirle cul ser su copago por los medicamentos por adelantado ya que esto es diferente dependiendo de la cobertura de su seguro. Sin embargo, es posible que podamos encontrar un medicamento sustituto a Audiological scientist un formulario para que el seguro cubra el medicamento que se considera necesario.   Si se requiere una autorizacin previa para que su compaa de seguros malta su medicamento, por favor permtanos de 1 a 2 das hbiles para completar este proceso.  Los precios de los medicamentos varan con frecuencia dependiendo del Environmental consultant de dnde se surte la receta y alguna farmacias pueden ofrecer precios ms baratos.  El sitio web www.goodrx.com tiene cupones para medicamentos de Health and safety inspector. Los precios aqu no  tienen en cuenta lo que podra costar con la ayuda del seguro (puede ser ms barato con su seguro), pero el sitio web puede darle el precio si no utiliz Tourist information centre manager.  - Puede imprimir el cupn correspondiente y llevarlo con su receta a la farmacia.  - Tambin puede pasar por nuestra oficina durante el horario de atencin regular y Education officer, museum una tarjeta de cupones de GoodRx.  - Si necesita que su receta se enve electrnicamente a una farmacia diferente, informe a nuestra oficina a travs de MyChart de [place] o por telfono llamando al [phone] y presione la opcin 4.

## 2024-07-08 NOTE — Progress Notes (Signed)
   New Patient Visit   Subjective  Kerry Simpson is a 78 y.o. female who presents for the following: Places of concern at L knee cap previously removed a couple years ago at a different dermatology office biopsy showed benign,but slowly started coming back and feels like it did get bigger, places at forehead. Patient denies any hx of skin cancer, no family hx of skin cancer.    The following portions of the chart were reviewed this encounter and updated as appropriate: medications, allergies, medical history  Review of Systems:  No other skin or systemic complaints except as noted in HPI or Assessment and Plan.  Objective  Well appearing patient in no apparent distress; mood and affect are within normal limits.  A focused examination was performed of the following areas: Face, L knee  Relevant exam findings are noted in the Assessment and Plan.  Left knee 1.8 x 1.4 cm violaceous indurated plaque with peripheral hyperpigmentation   forehead x2 (2) Stuck on waxy paps with erythema  Assessment & Plan   SEBORRHEIC KERATOSIS - Stuck-on, waxy, tan-brown papules and/or plaques  - Benign-appearing - Discussed benign etiology and prognosis. - Observe - Call for any changes  NEOPLASM OF SKIN Left knee Skin / nail biopsy Type of biopsy: tangential   Informed consent: discussed and consent obtained   Timeout: patient name, date of birth, surgical site, and procedure verified   Procedure prep:  Patient was prepped and draped in usual sterile fashion Prep type:  Isopropyl alcohol Anesthesia: the lesion was anesthetized in a standard fashion   Anesthetic:  1% lidocaine  w/ epinephrine  1-100,000 buffered w/ 8.4% NaHCO3 Instrument used: DermaBlade   Hemostasis achieved with: pressure, aluminum chloride and electrodesiccation   Outcome: patient tolerated procedure well   Post-procedure details: sterile dressing applied and wound care instructions given   Dressing type: bandage and  petrolatum    Specimen 1 - Surgical pathology Differential Diagnosis: Dermatofibroma vs other  Check Margins: No 1.8 x 1.4 cm violaceous indurated plaque with peripheral hyperpigmentation ED precautions given to patient, advised that if bleeding has not subsided after 20 minutes and continuous pressure to head to nearest ED to control bleeding.   Patient advised that wounds below the knee take longer to heal.   Will request medical records from patient's previous dermatologist.  INFLAMED SEBORRHEIC KERATOSIS (2) forehead x2 (2) Symptomatic, irritating, patient would like treated. Destruction of lesion - forehead x2 (2) Complexity: simple   Destruction method: cryotherapy   Informed consent: discussed and consent obtained   Timeout:  patient name, date of birth, surgical site, and procedure verified Lesion destroyed using liquid nitrogen: Yes   Region frozen until ice ball extended beyond lesion: Yes   Cryo cycles: 1 or 2. Outcome: patient tolerated procedure well with no complications   Post-procedure details: wound care instructions given   Additional details:  Prior to procedure, discussed risks of blister formation, small wound, skin dyspigmentation, or rare scar following cryotherapy. Recommend Vaseline ointment to treated areas while healing.   SEBORRHEIC KERATOSES    Return if symptoms worsen or fail to improve, for w/ Dr. Claudene pending biopsy results.  I, Jacquelynn V. Wilfred, CMA, am acting as scribe for Boneta Claudene, MD.   Documentation: I have reviewed the above documentation for accuracy and completeness, and I agree with the above.  Boneta Claudene, MD

## 2024-07-14 LAB — SURGICAL PATHOLOGY

## 2024-07-15 ENCOUNTER — Ambulatory Visit: Payer: Self-pay | Admitting: Dermatology

## 2024-07-15 NOTE — Telephone Encounter (Signed)
Discussed pathology results. Patient voiced understanding.

## 2024-07-15 NOTE — Telephone Encounter (Signed)
-----   Message from [place] sent at 07/15/2024  2:46 PM EDT ----- Diagnosis: left knee :       DERMATOFIBROMA, HEMOSIDEROTIC TYPE, BASE INVOLVED   Plan: please call Biopsy from left leg shows a benign overgrowth of fibrous and scar tissue called a dermatofibroma. It was specifically a hemosiderotic dermatofibroma, which means there is extra blood flow and  hemorrhage within it. No treatment is needed but surgical removal is recommended if it regrows. ----- Message ----- From: Interface, Lab In [place] Sent: 07/14/2024   3:45 PM EDT To: Boneta Sharps, MD

## 2024-08-05 ENCOUNTER — Ambulatory Visit

## 2024-08-19 NOTE — Progress Notes (Signed)
 Kerry Simpson                                          MRN: [id number]   08/19/2024   The [facility] Quality Team Specialist reviewed this patient medical record for the purposes of chart review for care gap closure. The following were reviewed: chart review for care gap closure-controlling blood pressure.    [facility]I Quality Team

## 2024-08-24 DIAGNOSIS — M170 Bilateral primary osteoarthritis of knee: Secondary | ICD-10-CM | POA: Diagnosis not present

## 2024-09-16 ENCOUNTER — Ambulatory Visit: Admitting: Family Medicine

## 2024-09-20 ENCOUNTER — Ambulatory Visit (INDEPENDENT_AMBULATORY_CARE_PROVIDER_SITE_OTHER): Admitting: Family Medicine

## 2024-09-20 ENCOUNTER — Encounter: Payer: Self-pay | Admitting: Family Medicine

## 2024-09-20 VITALS — BP 124/80 | HR 68 | Temp 98.5°F | Ht 59.0 in | Wt 196.0 lb

## 2024-09-20 DIAGNOSIS — Z23 Encounter for immunization: Secondary | ICD-10-CM

## 2024-09-20 DIAGNOSIS — M7989 Other specified soft tissue disorders: Secondary | ICD-10-CM

## 2024-09-20 NOTE — Patient Instructions (Signed)
 I put the referral in for ultrasound of your left leg  Please let us  know if you don't hear in 1-2 weeks to set that up (mychart message or call or letter)   If you have soreness= try ice pack   Call if any concerns before it gets scheduled

## 2024-09-20 NOTE — Assessment & Plan Note (Signed)
 LLE medial to knee  Per pt uncomfortable, may be getting bigger  On exam consistent with lipoma  Pt mentions she may also have a baker's cyst Also has OA in that knee   Will order US  of LLE  Pending report Pt would be interested in surgical opinion after that

## 2024-09-20 NOTE — Progress Notes (Signed)
 Subjective:    Patient ID: Kerry Simpson, female    DOB: 12/24/1945, 78 y.o.   MRN: [id number]  HPI  Wt Readings from Last 3 Encounters:  09/20/24 196 lb (88.9 kg)  06/28/24 199 lb 4 oz (90.4 kg)  04/16/24 192 lb 8 oz (87.3 kg)   39.59 kg/m  Vitals:   09/20/24 0856  BP: 124/80  Pulse: 68  Temp: 98.5 F (36.9 C)  SpO2: 95%    Pt presents for bump/knot on her leg   Medial to her left knee    Had lesion removed from left knee - healing up well/not stitches (dermatofibroma)  Also has a baker's cyst in the back of that knee   Feels a knot medial/up from that  A little tender to the touch  Ortho said to keep an eye on it   That knee has arthritis in it     Patient Active Problem List   Diagnosis Date Noted   Current use of proton pump inhibitor 03/29/2024   Positive colorectal cancer screening using Cologuard test 05/05/2023   Colon cancer screening 04/15/2023   Encounter for screening mammogram for breast cancer 04/15/2023   Adverse effect of amiodarone  04/10/2022   Skin lesion of left leg 04/10/2022   Mass of soft tissue of lower leg 04/10/2022   Left knee pain 07/04/2021   Prediabetes 05/23/2019   Iron deficiency anemia 05/23/2018   Screening mammogram, encounter for 05/20/2018   (HFpEF) heart failure with preserved ejection fraction (HCC) 05/19/2017   GERD (gastroesophageal reflux disease) 08/20/2016   DOE (dyspnea on exertion) 05/28/2016   Sleep disturbance 05/28/2016   Somnolence, daytime 05/09/2016   Encounter for anticoagulation discussion and counseling 02/15/2016   Atrial fibrillation and flutter (HCC) 02/14/2016   Routine general medical examination at a health care facility 10/13/2015   Estrogen deficiency 10/13/2015   History of melena 12/12/2014   Gallstone 12/07/2014   Encounter for routine gynecological examination 04/14/2013   STRESS REACTION, ACUTE, WITH EMOTIONAL DISTURBANCE 11/26/2010   Osteopenia 06/21/2009   Subclinical  hypothyroidism 04/26/2008   Morbid obesity (HCC) 04/26/2008   Essential hypertension 04/26/2008   Past Medical History:  Diagnosis Date   Anemia    iron def   Atrial fibrillation (HCC)    Dr Perla   Cataract 05/13/2018   both eyes per last vision exam    Complication of anesthesia    quit breathing -Endoscopy( 12-22-14)   Difficult intubation    Pt reports unaware of difficulty   Fibrocystic breast    Fracture of right shoulder    Gallstones    Grave's disease    Graves disease    Hypertension    Obesity    Osteoporosis    osteopenia   Past Surgical History:  Procedure Laterality Date   CATARACT EXTRACTION W/PHACO Right 09/02/2022   Procedure: CATARACT EXTRACTION PHACO AND INTRAOCULAR LENS PLACEMENT (IOC) RIGHT;  Surgeon: Myrna Adine Anes, MD;  Location: [facility];  Service: Ophthalmology;  Laterality: Right;  8.31 0:49.5   CATARACT EXTRACTION W/PHACO Left 09/16/2022   Procedure: CATARACT EXTRACTION PHACO AND INTRAOCULAR LENS PLACEMENT (IOC) LEFT;  Surgeon: Myrna Adine Anes, MD;  Location: [facility];  Service: Ophthalmology;  Laterality: Left;  13.18 1:10.5   COLONOSCOPY  09/29/2014   Normal, Princella Nida, MD   ELECTROPHYSIOLOGIC STUDY N/A 04/12/2016   Procedure: CARDIOVERSION;  Surgeon: Evalene JINNY Perla, MD;  Location: [facility];  Service: Cardiovascular;  Laterality: N/A;   ESOPHAGOGASTRODUODENOSCOPY (EGD) WITH  PROPOFOL  N/A 09/08/2018   Procedure: ESOPHAGOGASTRODUODENOSCOPY (EGD) WITH PROPOFOL ;  Surgeon: Gaylyn Gladis PENNER, MD;  Location: [facility];  Service: Endoscopy;  Laterality: N/A;   ESOPHAGOGASTRODUODENOSCOPY ENDOSCOPY     EUS N/A 01/12/2015   Procedure: UPPER ENDOSCOPIC ULTRASOUND (EUS) LINEAR;  Surgeon: Toribio SHAUNNA Cedar, MD;  Location: [facility];  Service: Endoscopy;  Laterality: N/A;   TUBAL LIGATION     Social History   Tobacco Use   Smoking status: Never    Passive exposure: Never   Smokeless tobacco: Never  Vaping Use    Vaping status: Never Used  Substance Use Topics   Alcohol use: No    Alcohol/week: 0.0 standard drinks of alcohol   Drug use: No   Family History  Problem Relation Age of Onset   Hypertension Mother    Diabetes Father    Hypertension Father    Hypertension Brother    Cancer Brother        prostate   Heart failure Brother    Heart disease Brother        with bypass   Colon cancer Neg Hx    Breast cancer Neg Hx    No Known Allergies Current Outpatient Medications on File Prior to Visit  Medication Sig Dispense Refill   acetaminophen (TYLENOL) 500 MG tablet Take 1,000 mg by mouth daily as needed.      amiodarone  (PACERONE ) 200 MG tablet Take 1 tablet (200 mg total) by mouth daily. With extra as needed for afib 110 tablet 3   amLODipine  (NORVASC ) 5 MG tablet Take 1 tablet (5 mg total) by mouth daily. KEEP OV. 30 tablet 2   calcium carbonate (CALCIUM 600) 1500 (600 Ca) MG TABS tablet Take 1,500 mg by mouth daily.     Collagen-Vitamin C-Biotin (COLLAGEN PO) Take by mouth daily.     fosinopril  (MONOPRIL ) 20 MG tablet Take 1 tablet (20 mg total) by mouth daily. 90 tablet 3   furosemide  (LASIX ) 20 MG tablet Take 1 tablet (20 mg total) by mouth daily as needed. 90 tablet 3   Ginkgo Biloba (GINKOBA PO) Take by mouth daily.     Glucosamine-Chondroitin (GLUCOSAMINE CHONDR COMPLEX PO) Take by mouth daily.     melatonin 5 MG TABS Take 10 mg by mouth at bedtime as needed.     Misc Natural Products (YUMVS BEET ROOT-TART CHERRY PO) Take 1 tablet by mouth daily.     Multiple Vitamin (MULTIVITAMIN) capsule Take 1 capsule by mouth daily.     omeprazole  (PRILOSEC) 20 MG capsule TAKE ONE CAPSULE BY MOUTH ONCE DAILY 90 capsule 1   PARoxetine  (PAXIL ) 20 MG tablet TAKE ONE TABLET BY MOUTH ONCE A DAY 90 tablet 1   TURMERIC PO Take 1 capsule by mouth daily.     XARELTO  20 MG TABS tablet TAKE ONE TABLET BY MOUTH ONCE A DAY WITH SUPPER 90 tablet 1   No current facility-administered medications on file  prior to visit.    Review of Systems  Constitutional:  Negative for fatigue, fever and unexpected weight change.  Respiratory:  Negative for chest tightness and shortness of breath.   Cardiovascular:  Negative for chest pain.  Musculoskeletal:  Positive for arthralgias.  Skin:        Growth on leg near left knee  Tight and uncomfortable   Neurological:  Negative for weakness and numbness.       Objective:   Physical Exam Constitutional:      General: She is not in acute  distress.    Appearance: Normal appearance. She is well-developed. She is obese. She is not ill-appearing or diaphoretic.  HENT:     Head: Normocephalic and atraumatic.     Mouth/Throat:     Mouth: Mucous membranes are moist.  Eyes:     Conjunctiva/sclera: Conjunctivae normal.     Pupils: Pupils are equal, round, and reactive to light.  Neck:     Thyroid : No thyromegaly.     Vascular: No carotid bruit or JVD.  Cardiovascular:     Rate and Rhythm: Normal rate and regular rhythm.     Heart sounds: Normal heart sounds.     No gallop.  Pulmonary:     Effort: Pulmonary effort is normal. No respiratory distress.     Breath sounds: Normal breath sounds. No wheezing or rales.  Abdominal:     General: There is no distension or abdominal bruit.     Palpations: Abdomen is soft.  Musculoskeletal:     Cervical back: Normal range of motion and neck supple.     Right lower leg: No edema.     Left lower leg: No edema.  Lymphadenopathy:     Cervical: No cervical adenopathy.  Skin:    General: Skin is warm and dry.     Coloration: Skin is not pale.     Findings: No rash.     Comments: Soft semil mobile lump medial to left knee  Mildly tender on deep palpation  No palpable cords   No skin changes Rom of knee is limited by arthritis   Neurological:     Mental Status: She is alert.     Coordination: Coordination normal.     Deep Tendon Reflexes: Reflexes are normal and symmetric. Reflexes normal.  Psychiatric:         Mood and Affect: Mood normal.           Assessment & Plan:   Problem List Items Addressed This Visit       Other   Mass of soft tissue of lower leg - Primary   LLE medial to knee  Per pt uncomfortable, may be getting bigger  On exam consistent with lipoma  Pt mentions she may also have a baker's cyst Also has OA in that knee   Will order US  of LLE  Pending report Pt would be interested in surgical opinion after that         Relevant Orders   US  LT LOWER EXTREM LTD SOFT TISSUE NON VASCULAR   Other Visit Diagnoses       Need for influenza vaccination       Relevant Orders   Flu vaccine HIGH DOSE PF(Fluzone Trivalent) (Completed)

## 2024-09-21 ENCOUNTER — Other Ambulatory Visit: Payer: Self-pay | Admitting: Cardiovascular Disease

## 2024-10-05 ENCOUNTER — Ambulatory Visit
Admission: RE | Admit: 2024-10-05 | Discharge: 2024-10-05 | Disposition: A | Source: Ambulatory Visit | Attending: Family Medicine | Admitting: Family Medicine

## 2024-10-05 DIAGNOSIS — M7989 Other specified soft tissue disorders: Secondary | ICD-10-CM

## 2024-10-05 DIAGNOSIS — R2232 Localized swelling, mass and lump, left upper limb: Secondary | ICD-10-CM | POA: Diagnosis not present

## 2024-10-12 ENCOUNTER — Ambulatory Visit: Payer: Self-pay | Admitting: Family Medicine

## 2024-10-12 DIAGNOSIS — M7989 Other specified soft tissue disorders: Secondary | ICD-10-CM

## 2024-10-14 ENCOUNTER — Emergency Department
Admission: EM | Admit: 2024-10-14 | Discharge: 2024-10-14 | Disposition: A | Discharge status: Home / Self Care | Attending: Emergency Medicine | Admitting: Emergency Medicine

## 2024-10-14 ENCOUNTER — Emergency Department

## 2024-10-14 ENCOUNTER — Ambulatory Visit: Payer: Self-pay

## 2024-10-14 ENCOUNTER — Other Ambulatory Visit: Payer: Self-pay

## 2024-10-14 DIAGNOSIS — S0012XA Contusion of left eyelid and periocular area, initial encounter: Secondary | ICD-10-CM | POA: Insufficient documentation

## 2024-10-14 DIAGNOSIS — S0990XA Unspecified injury of head, initial encounter: Secondary | ICD-10-CM

## 2024-10-14 DIAGNOSIS — S0011XA Contusion of right eyelid and periocular area, initial encounter: Secondary | ICD-10-CM | POA: Diagnosis not present

## 2024-10-14 DIAGNOSIS — S0083XA Contusion of other part of head, initial encounter: Secondary | ICD-10-CM | POA: Diagnosis not present

## 2024-10-14 DIAGNOSIS — W1839XA Other fall on same level, initial encounter: Secondary | ICD-10-CM | POA: Diagnosis not present

## 2024-10-14 DIAGNOSIS — W19XXXA Unspecified fall, initial encounter: Secondary | ICD-10-CM

## 2024-10-14 LAB — BASIC METABOLIC PANEL WITH GFR
Anion gap: 10 (ref 5–15)
BUN: 16 mg/dL (ref 8–23)
CO2: 26 mmol/L (ref 22–32)
Calcium: 9.1 mg/dL (ref 8.9–10.3)
Chloride: 103 mmol/L (ref 98–111)
Creatinine, Ser: 0.84 mg/dL (ref 0.44–1.00)
GFR, Estimated: >60 mL/min (ref >60)
Glucose, Bld: 100 mg/dL — ABNORMAL HIGH (ref 70–99)
Potassium: 4.2 mmol/L (ref 3.5–5.1)
Sodium: 139 mmol/L (ref 135–145)

## 2024-10-14 LAB — CBC
HCT: 44 % (ref 36.0–46.0)
Hemoglobin: 14.3 g/dL (ref 12.0–15.0)
MCH: 29.5 pg (ref 26.0–34.0)
MCHC: 32.5 g/dL (ref 30.0–36.0)
MCV: 90.7 fL (ref 80.0–100.0)
Platelets: 231 K/uL (ref 150–400)
RBC: 4.85 MIL/uL (ref 3.87–5.11)
RDW: 14.3 % (ref 11.5–15.5)
WBC: 8 K/uL (ref 4.0–10.5)
nRBC: 0 % (ref 0.0–0.2)

## 2024-10-14 NOTE — Telephone Encounter (Signed)
 FYI Only or Action Required?: FYI only for provider: ED advised.  Patient was last seen in primary care on 09/20/2024 by Randeen Laine LABOR, MD.  Called Nurse Triage reporting Fall, Headache, and Facial Swelling.  Symptoms began several days ago.  Interventions attempted: Rest, hydration, or home remedies and Ice/heat application.  Symptoms are: gradually worsening.  Triage Disposition: Go to ED Now (or PCP Triage)  Patient/caregiver understands and will follow disposition?: Yes   Copied from CRM #[id number]. Topic: Clinical - Medical Advice >> Oct 14, 2024  9:38 AM Tiffini S wrote: Reason for CRM: Patient called stating that she fell on her face/ she have bags under her eyes that are discolored- have a unusual fluid in them/ vision is okay but pain in head.   Scheduled appointment with pcp for 10/15/24 watilist- transferred call to triage nurse Reason for Disposition  Taking Coumadin (warfarin) or other strong blood thinner, or known bleeding disorder (e.g., thrombocytopenia)  Answer Assessment - Initial Assessment Questions Pt fell on Saturday while looking for Christmas Decorations. Fell on face onto some rocks. Takes Xarelto . Onset of goose egg on head initially, now mostly resolved. Pt has bags under eyes chronically, worse now. Appear red and purple and more swollen around both eyes. Mild intermittent headache. Denies changes to speech, vision or ability to walk. Speaking in clear coherent sentences. Denies numbness, tingling or weakness. Advised ED today and explained risks of current symptoms and delaying care. Pt agreeable to go. Advised EMS if symptoms worsen.  1. MECHANISM: How did the injury happen? For falls, ask: What height did you fall from? and What surface did you fall against?      Fell on face onto some rocks while trying to get some christmas decorations  2. ONSET: When did the injury happen? (e.g., minutes, hours ago)      Saturday   3. NEUROLOGIC SYMPTOMS:  Was there any loss of consciousness? Are there any other neurological symptoms?      Deneis  4. MENTAL STATUS: Does the person know who they are, who you are, and where they are?      AAO  5. LOCATION: What part of the head was hit?      Face, mostly on the right  6. SCALP APPEARANCE: What does the scalp look like? Is it bleeding now? If Yes, ask: Is it difficult to stop?      Goose egg on head. Has improved over the past few days  7. SIZE: For cuts, bruises, or swelling, ask: How large is it? (e.g., inches or centimeters)      Triage cut short d/t symptoms  8. PAIN: Is there any pain? If Yes, ask: How bad is it? (Scale 0-10; or none, mild, moderate, severe)     Headache, right forehead to ear. Mild pain, intermittent.  9. TETANUS: For any breaks in the skin, ask: When was your last tetanus booster?     Triage cut short d/t symptoms  10. BLOOD THINNERS: Do you take any blood thinners? (e.g., aspirin, clopidogrel / Plavix, coumadin, heparin). Notes: Other strong blood thinners include: Arixtra (fondaparinux), Eliquis (apixaban), Pradaxa (dabigatran), and Xarelto  (rivaroxaban ).       Xarelto   11. OTHER SYMPTOMS: Do you have any other symptoms? (e.g., neck pain, vomiting)       Reddish purple discoloration bags under both eyes  Protocols used: Head Injury-A-AH

## 2024-10-14 NOTE — ED Provider Notes (Signed)
 [facility] Provider Note    Event Date/Time   First MD Initiated Contact with Patient 10/14/24 1238     (approximate)   History   Fall (/) and Facial Injury   HPI  Kerry Simpson is a 78 y.o. female who comes in for a fall.  Patient is on blood thinners for A-fib.  Patient has bruising around her bilateral eyes on her right forehead.  Reportedly had a fall on Saturday while looking for Christmas decorations in a building.  Patient reports of some mechanical fall.  She denies any chest pain, shortness of breath, syncopal.  She reports that she had a hematoma to her forehead and that now she started to have bruising around her eyes.  She did report a little puncture to her nose but she stated that the bleeding has stopped on its own.  She denies any headaches, confusion or other concerns.  She reports coming in because her primary care doctor told her to come in and get evaluated given the bruising is now spreading around her eyes.  No vision changes no chest pain no abdominal pain no other injuries.   Physical Exam   Triage Vital Signs: ED Triage Vitals  Encounter Vitals Group     BP 10/14/24 1214 (!) 181/86     Girls Systolic BP Percentile --      Girls Diastolic BP Percentile --      Boys Systolic BP Percentile --      Boys Diastolic BP Percentile --      Pulse Rate 10/14/24 1214 68     Resp 10/14/24 1214 18     Temp 10/14/24 1214 97.9 F (36.6 C)     Temp Source 10/14/24 1214 Oral     SpO2 10/14/24 1214 98 %     Weight --      Height --      Head Circumference --      Peak Flow --      Pain Score 10/14/24 1215 1     Pain Loc --      Pain Education --      Exclude from Growth Chart --     Most recent vital signs: Vitals:   10/14/24 1214  BP: (!) 181/86  Pulse: 68  Resp: 18  Temp: 97.9 F (36.6 C)  SpO2: 98%     General: Awake, no distress.  CV:  Good peripheral perfusion.  Resp:  Normal effort.  Abd:  No distention.   Other:  Patient has some old bruising noted around her eyes.  Less than 1 cm old appearing laceration to the bridge of the nose.  No chest wall tenderness no abdominal tenderness moving all extremities well.   ED Results / Procedures / Treatments   Labs (all labs ordered are listed, but only abnormal results are displayed) Labs Reviewed  CBC  BASIC METABOLIC PANEL WITH GFR    RADIOLOGY I have reviewed the CT  personally and interpreted  no ich    PROCEDURES:  Critical Care performed: No  Procedures   MEDICATIONS ORDERED IN ED: Medications - No data to display   IMPRESSION / MDM / ASSESSMENT AND PLAN / ED COURSE  I reviewed the triage vital signs and the nursing notes.   Patient's presentation is most consistent with acute presentation with potential threat to life or bodily function.    Differential includes cervical fracture, facial fracture, intercranial hemorrhage CT imaging ordered from triage to further evaluate.  Patient reports Tdap updated last 10 years.  Blood work ordered from triage to evaluate for any anemia or evidence of infection.   BMP is reassuring CBC reassuring  IMPRESSION: 1. No acute intracranial abnormality. 2. No acute fracture or traumatic malalignment of the cervical spine. 3. No acute fracture of the facial bones. 4. Right forehead contusion. 5. Approximately 2.7 cm right thyroid  nodule. Recommend thyroid  ultrasound if not previously performed.    Patient updated on results copy report given to her for the thyroid .  Patient repeat evaluated feels comfortable with plan for discharge home and denies any other concerns  Will have her follow-up with PCP for blood pressure check.      FINAL CLINICAL IMPRESSION(S) / ED DIAGNOSES   Final diagnoses:  Fall, initial encounter  Injury of head, initial encounter     Rx / DC Orders   ED Discharge Orders     None        Note:  This document was prepared using Dragon voice  recognition software and may include unintentional dictation errors.   Ernest Ronal BRAVO, MD 10/14/24 1409

## 2024-10-14 NOTE — ED Notes (Signed)
 Urine sample was sent to lab in case one is needed.

## 2024-10-14 NOTE — Telephone Encounter (Signed)
 In ED now  Agree with advisement  Will watch out for correspondence

## 2024-10-14 NOTE — ED Notes (Signed)
 Pt ambulated to hallway toilet and back without assistance from staff. Pt was slightly dizzy when initially sitting on the side of bed and waited until it passed before walking to restroom. Pt used husband's 4 prong cane to help with stabilization and had a steady, even gait. Pt was educated on how to provide urine sample and the use of the pull cord in the restroom in case assistance was needed. Pt verbalized understanding. Pt returned to bed, bed in the lowest, locked position. Pt tolerated activity well.

## 2024-10-14 NOTE — Discharge Instructions (Addendum)
 CT imaging is reassuring however you should follow-up with your primary care doctor for the thyroid  nodule and your blood pressure recheck and return to the ER develop fevers, worsening symptoms or any other concern  IMPRESSION: 1. No acute intracranial abnormality. 2. No acute fracture or traumatic malalignment of the cervical spine. 3. No acute fracture of the facial bones. 4. Right forehead contusion. 5. Approximately 2.7 cm right thyroid  nodule. Recommend thyroid  ultrasound if not previously performed.

## 2024-10-14 NOTE — ED Triage Notes (Signed)
 Pt to ED via POV from home. Pt reports fall on Saturday while looking for christmas decorations in a building. Pt denies dizziness. Denies LOC. Pt is on blood thinner for afib. Pt has bruising under bilateral eyes and right forehead.

## 2024-10-14 NOTE — Telephone Encounter (Signed)
 Next Appt With Family Medicine Dessie Balls, MD) 10/15/2024 at 11:30 AM

## 2024-10-15 ENCOUNTER — Ambulatory Visit: Admitting: Family Medicine

## 2024-10-22 NOTE — Progress Notes (Signed)
 TAMSIN NADER                                          MRN: [id number]   10/22/2024   The [facility] Quality Team Specialist reviewed this patient medical record for the purposes of chart review for care gap closure. The following were reviewed: abstraction for care gap closure-controlling blood pressure.    [facility] Quality Team

## 2024-10-26 ENCOUNTER — Encounter: Payer: Self-pay | Admitting: General Surgery

## 2024-10-26 ENCOUNTER — Ambulatory Visit: Admitting: General Surgery

## 2024-10-26 VITALS — BP 146/76 | HR 78 | Ht 59.0 in | Wt 192.0 lb

## 2024-10-26 DIAGNOSIS — D2372 Other benign neoplasm of skin of left lower limb, including hip: Secondary | ICD-10-CM | POA: Diagnosis not present

## 2024-10-26 DIAGNOSIS — D239 Other benign neoplasm of skin, unspecified: Secondary | ICD-10-CM

## 2024-10-26 DIAGNOSIS — M7122 Synovial cyst of popliteal space [Baker], left knee: Secondary | ICD-10-CM | POA: Diagnosis not present

## 2024-10-26 NOTE — Patient Instructions (Signed)
 Please give our office a call if you have any questions or concerns

## 2024-10-26 NOTE — Progress Notes (Signed)
 Patient ID: Kerry Simpson, female   DOB: [birth year], 78 y.o.   MRN: [id number] CC: Skin Lesion History of Present Illness Kerry Simpson is a 78 y.o. female with past medical history as below including atrial fibrillation who presents in consultation for left skin lesion.  The patient reports that she had a lesion removed from her left leg several years ago and then had it removed again in August of this year by dermatology.  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  She reports that it has grown back.  She also reports that she has some pain in her left posterior knee.  She denies any drainage from the area.  She denies any swelling her only complaint is that there is discoloration.  Past Medical History Past Medical History:  Diagnosis Date   Anemia    iron def   Atrial fibrillation (HCC)    Dr Perla   Cataract 05/13/2018   both eyes per last vision exam    Complication of anesthesia    quit breathing -Endoscopy( 12-22-14)   Difficult intubation    Pt reports unaware of difficulty   Fibrocystic breast    Fracture of right shoulder    Gallstones    Grave's disease    Graves disease    Hypertension    Obesity    Osteoporosis    osteopenia       Past Surgical History:  Procedure Laterality Date   CATARACT EXTRACTION W/PHACO Right 09/02/2022   Procedure: CATARACT EXTRACTION PHACO AND INTRAOCULAR LENS PLACEMENT (IOC) RIGHT;  Surgeon: Myrna Adine Anes, MD;  Location: [facility];  Service: Ophthalmology;  Laterality: Right;  8.31 0:49.5   CATARACT EXTRACTION W/PHACO Left 09/16/2022   Procedure: CATARACT EXTRACTION PHACO AND INTRAOCULAR LENS PLACEMENT (IOC) LEFT;  Surgeon: Myrna Adine Anes, MD;  Location: [facility];  Service: Ophthalmology;  Laterality: Left;  13.18 1:10.5   COLONOSCOPY  09/29/2014   Normal, Princella Nida, MD   ELECTROPHYSIOLOGIC STUDY N/A 04/12/2016   Procedure: CARDIOVERSION;  Surgeon: Evalene JINNY Perla, MD;  Location: [facility];   Service: Cardiovascular;  Laterality: N/A;   ESOPHAGOGASTRODUODENOSCOPY (EGD) WITH PROPOFOL  N/A 09/08/2018   Procedure: ESOPHAGOGASTRODUODENOSCOPY (EGD) WITH PROPOFOL ;  Surgeon: Gaylyn Gladis PENNER, MD;  Location: [facility];  Service: Endoscopy;  Laterality: N/A;   ESOPHAGOGASTRODUODENOSCOPY ENDOSCOPY     EUS N/A 01/12/2015   Procedure: UPPER ENDOSCOPIC ULTRASOUND (EUS) LINEAR;  Surgeon: Toribio SHAUNNA Cedar, MD;  Location: [facility];  Service: Endoscopy;  Laterality: N/A;   TUBAL LIGATION      Allergies[1]  Current Outpatient Medications  Medication Sig Dispense Refill   acetaminophen (TYLENOL) 500 MG tablet Take 1,000 mg by mouth daily as needed.      amiodarone  (PACERONE ) 200 MG tablet Take 1 tablet (200 mg total) by mouth daily. With extra as needed for afib 110 tablet 3   amLODipine  (NORVASC ) 5 MG tablet TAKE ONE TABLET (5 MG TOTAL) BY MOUTH DAILY. KEEP OFFICE VISIT 30 tablet 3   calcium carbonate (CALCIUM 600) 1500 (600 Ca) MG TABS tablet Take 1,500 mg by mouth daily.     Collagen-Vitamin C-Biotin (COLLAGEN PO) Take by mouth daily.     fosinopril  (MONOPRIL ) 20 MG tablet Take 1 tablet (20 mg total) by mouth daily. 90 tablet 3   furosemide  (LASIX ) 20 MG tablet Take 1 tablet (20 mg total) by mouth daily as needed. 90 tablet 3   Ginkgo Biloba (GINKOBA PO) Take by mouth daily.  Glucosamine-Chondroitin (GLUCOSAMINE CHONDR COMPLEX PO) Take by mouth daily.     melatonin 5 MG TABS Take 10 mg by mouth at bedtime as needed.     Misc Natural Products (YUMVS BEET ROOT-TART CHERRY PO) Take 1 tablet by mouth daily.     Multiple Vitamin (MULTIVITAMIN) capsule Take 1 capsule by mouth daily.     omeprazole  (PRILOSEC) 20 MG capsule TAKE ONE CAPSULE BY MOUTH ONCE DAILY 90 capsule 1   PARoxetine  (PAXIL ) 20 MG tablet TAKE ONE TABLET BY MOUTH ONCE A DAY 90 tablet 1   TURMERIC PO Take 1 capsule by mouth daily.     XARELTO  20 MG TABS tablet TAKE ONE TABLET BY MOUTH ONCE A DAY WITH SUPPER 90 tablet 1    No current facility-administered medications for this visit.    Family History Family History  Problem Relation Age of Onset   Hypertension Mother    Diabetes Father    Hypertension Father    Hypertension Brother    Cancer Brother        prostate   Heart failure Brother    Heart disease Brother        with bypass   Colon cancer Neg Hx    Breast cancer Neg Hx        Social History Social History[2]      ROS Full ROS of systems performed and is otherwise negative there than what is stated in the HPI  Physical Exam Blood pressure (!) 146/76, pulse 78, height 4' 11 (1.499 m), weight 192 lb (87.1 kg), SpO2 97%.  Alert and oriented x 3, normal work of breathing room air, regular rate and rhythm abdomen soft, nontender nondistended, left leg she does have a quite large Baker's cyst that is not tender to palpation, on the anterior medial leg just inferior to the knee there is an area that is well-healed with hyperpigmentation.  This is flat and the skin overlying this is thin.  There is no mass or lesion that can be palpated.  Data Reviewed Ultrasound reviewed and significant for Baker's cyst.  I have personally reviewed the patient's imaging and medical records.    Assessment/Plan    Patient status post excision of dermatofibroma.  There is some hyperpigmentation of the area.  I discussed with her that the skin here is thin and there is no mass lesion that I can excise.  Given that it was a benign finding from her dermatologist I think there is risk of excising the hyperpigmentation because of the tension on the skin here and the thinness of the skin.  I discussed with her that the lesion was benign and the hyperpigmentation does not have any worrisome features.  She is going to see her dermatologist soon and if they believe that I need to resect this area then I will but I do not think it carries high malignant potential.  As far as her Baker's cyst I did discuss with her that  I am happy to send her to an orthopedist to get this evaluated.  She can follow-up in our office as needed.  A total of 45 minutes was spent reviewing the patient's chart, performing history and physical and discussing treatment options with the patient     Jayson MALVA Endow 10/26/2024, 12:56 PM     [1] No Known Allergies [2]  Social History Tobacco Use   Smoking status: Never    Passive exposure: Never   Smokeless tobacco: Never  Vaping Use  Vaping status: Never Used  Substance Use Topics   Alcohol use: No    Alcohol/week: 0.0 standard drinks of alcohol   Drug use: No

## 2024-10-28 ENCOUNTER — Other Ambulatory Visit: Payer: Self-pay | Admitting: Cardiology

## 2024-10-28 NOTE — Telephone Encounter (Signed)
 Prescription refill request for Xarelto  received.  Indication:afib Last office visit:8/25 Weight:87.1  kg Age:78 Scr:0.84  12/25 CrCl:74.67  ml/min  Prescription refilled

## 2024-11-26 ENCOUNTER — Other Ambulatory Visit: Payer: Self-pay | Admitting: Family Medicine

## 2024-12-06 ENCOUNTER — Ambulatory Visit

## 2024-12-06 VITALS — BP 130/89 | Ht 59.0 in | Wt 192.0 lb

## 2024-12-06 DIAGNOSIS — Z0000 Encounter for general adult medical examination without abnormal findings: Secondary | ICD-10-CM | POA: Diagnosis not present

## 2024-12-06 NOTE — Patient Instructions (Signed)
 Kerry Simpson,  Thank you for taking the time for your Medicare Wellness Visit. I appreciate your continued commitment to your health goals. Please review the care plan we discussed, and feel free to reach out if I can assist you further.  Please note that Annual Wellness Visits do not include a physical exam. Some assessments may be limited, especially if the visit was conducted virtually. If needed, we may recommend an in-person follow-up with your provider.  Ongoing Care Seeing your primary care provider every 3 to 6 months helps us  monitor your health and provide consistent, personalized care.   Referrals If a referral was made during today's visit and you haven't received any updates within two weeks, please contact the referred provider directly to check on the status.  Recommended Screenings:  Health Maintenance  Topic Date Due   Medicare Annual Wellness Visit  04/14/2024   COVID-19 Vaccine (5 - 2025-26 season) 10/07/2027*   Breast Cancer Screening  05/17/2025   DTaP/Tdap/Td vaccine (2 - Td or Tdap) 11/08/2031   Pneumococcal Vaccine for age over 55  Completed   Flu Shot  Completed   Osteoporosis screening with Bone Density Scan  Completed   Hepatitis C Screening  Completed   Zoster (Shingles) Vaccine  Completed   Meningitis B Vaccine  Aged Out   Colon Cancer Screening  Discontinued  *Topic was postponed. The date shown is not the original due date.       10/14/2024   12:16 PM  Advanced Directives  Does Patient Have a Medical Advance Directive? No  Would patient like information on creating a medical advance directive? No - Patient declined    Vision: Annual vision screenings are recommended for early detection of glaucoma, cataracts, and diabetic retinopathy. These exams can also reveal signs of chronic conditions such as diabetes and high blood pressure.  Dental: Annual dental screenings help detect early signs of oral cancer, gum disease, and other conditions linked to  overall health, including heart disease and diabetes.  Please see the attached documents for additional preventive care recommendations.

## 2024-12-06 NOTE — Progress Notes (Signed)
 " I connected with  Kerry Simpson on 12/06/24 by a audio enabled telemedicine application and verified that I am speaking with the correct person using two identifiers.  Patient Location: Home  Provider Location: Home Office  Persons Participating in Visit: Patient.  I discussed the limitations of evaluation and management by telemedicine. The patient expressed understanding and agreed to proceed.  Vital Signs: Because this visit was a virtual/telehealth visit, some criteria may be missing or patient reported. Any vitals not documented were not able to be obtained and vitals that have been documented are patient reported.  Chief Complaint  Patient presents with   Medicare Wellness     Subjective:   Kerry Simpson is a 79 y.o. female who presents for a Medicare Annual Wellness Visit.  Visit info / Clinical Intake: Medicare Wellness Visit Type:: Subsequent Annual Wellness Visit Persons participating in visit and providing information:: patient Medicare Wellness Visit Mode:: Telephone If telephone:: video declined Since this visit was completed virtually, some vitals may be partially provided or unavailable. Missing vitals are due to the limitations of the virtual format.: Documented vitals are patient reported If Telephone or Video please confirm:: I connected with patient using audio/video enable telemedicine. I verified patient identity with two identifiers, discussed telehealth limitations, and patient agreed to proceed. Patient Location:: home Provider Location:: home office Interpreter Needed?: No Pre-visit prep was completed: yes AWV questionnaire completed by patient prior to visit?: no Living arrangements:: lives with spouse/significant other Patient's Overall Health Status Rating: very good Typical amount of pain: none Does pain affect daily life?: no Are you currently prescribed opioids?: no  Dietary Habits and Nutritional Risks How many meals a day?: 3 Eats fruit  and vegetables daily?: yes Most meals are obtained by: preparing own meals In the last 2 weeks, have you had any of the following?: none Diabetic:: no  Functional Status Activities of Daily Living (to include ambulation/medication): Independent Ambulation: Independent Medication Administration: Independent Home Management (perform basic housework or laundry): Independent Manage your own finances?: yes Primary transportation is: driving Concerns about vision?: no *vision screening is required for WTM* Concerns about hearing?: no  Fall Screening Falls in the past year?: 1 Number of falls in past year: 0 Was there an injury with Fall?: 1 Fall Risk Category Calculator: 2 Patient Fall Risk Level: Moderate Fall Risk  Fall Risk Patient at Risk for Falls Due to: History of fall(s) Fall risk Follow up: Falls evaluation completed  Home and Transportation Safety: All rugs have non-skid backing?: N/A, no rugs All stairs or steps have railings?: N/A, no stairs Grab bars in the bathtub or shower?: yes Have non-skid surface in bathtub or shower?: yes Good home lighting?: yes Regular seat belt use?: yes Hospital stays in the last year:: no  Cognitive Assessment Difficulty concentrating, remembering, or making decisions? : no Will 6CIT or Mini Cog be Completed: yes What year is it?: 0 points What month is it?: 0 points Give patient an address phrase to remember (5 components): remember words apple , table, penny About what time is it?: 0 points Count backwards from 20 to 1: 0 points Say the months of the year in reverse: 0 points Repeat the address phrase from earlier: 0 points 6 CIT Score: 0 points  Advance Directives (For Healthcare) Does Patient Have a Medical Advance Directive?: Yes Does patient want to make changes to medical advance directive?: No - Patient declined Type of Advance Directive: Healthcare Power of [place]; Living will; Out of facility  DNR (pink MOST or yellow  form) Copy of Healthcare Power of Attorney in Chart?: No - copy requested Copy of Living Will in Chart?: No - copy requested Out of facility DNR (pink MOST or yellow form) in Chart? (Ambulatory ONLY): No - copy requested Would patient like information on creating a medical advance directive?: No - Patient declined  Reviewed/Updated  Reviewed/Updated: Reviewed All (Medical, Surgical, Family, Medications, Allergies, Care Teams, Patient Goals)    Allergies (verified) Patient has no known allergies.   Current Medications (verified) Outpatient Encounter Medications as of 12/06/2024  Medication Sig   acetaminophen (TYLENOL) 500 MG tablet Take 1,000 mg by mouth daily as needed.    amiodarone  (PACERONE ) 200 MG tablet Take 1 tablet (200 mg total) by mouth daily. With extra as needed for afib   amLODipine  (NORVASC ) 5 MG tablet TAKE ONE TABLET (5 MG TOTAL) BY MOUTH DAILY. KEEP OFFICE VISIT   calcium carbonate (CALCIUM 600) 1500 (600 Ca) MG TABS tablet Take 1,500 mg by mouth daily.   Collagen-Vitamin C-Biotin (COLLAGEN PO) Take by mouth daily.   fosinopril  (MONOPRIL ) 20 MG tablet Take 1 tablet (20 mg total) by mouth daily.   furosemide  (LASIX ) 20 MG tablet Take 1 tablet (20 mg total) by mouth daily as needed.   Ginkgo Biloba (GINKOBA PO) Take by mouth daily.   Glucosamine-Chondroitin (GLUCOSAMINE CHONDR COMPLEX PO) Take by mouth daily.   melatonin 5 MG TABS Take 10 mg by mouth at bedtime as needed.   Misc Natural Products (YUMVS BEET ROOT-TART CHERRY PO) Take 1 tablet by mouth daily.   Multiple Vitamin (MULTIVITAMIN) capsule Take 1 capsule by mouth daily.   omeprazole  (PRILOSEC) 20 MG capsule TAKE ONE CAPSULE BY MOUTH ONCE DAILY   PARoxetine  (PAXIL ) 20 MG tablet TAKE ONE TABLET BY MOUTH ONCE A DAY   TURMERIC PO Take 1 capsule by mouth daily.   XARELTO  20 MG TABS tablet TAKE ONE TABLET BY MOUTH ONCE A DAY WITH SUPPER   No facility-administered encounter medications on file as of 12/06/2024.     History: Past Medical History:  Diagnosis Date   Anemia    iron def   Atrial fibrillation (HCC)    Dr Gollan   Cataract 05/13/2018   both eyes per last vision exam    Complication of anesthesia    quit breathing -Endoscopy( 12-22-14)   Difficult intubation    Pt reports unaware of difficulty   Fibrocystic breast    Fracture of right shoulder    Gallstones    Grave's disease    Graves disease    Hypertension    Obesity    Osteoporosis    osteopenia   Past Surgical History:  Procedure Laterality Date   CATARACT EXTRACTION W/PHACO Right 09/02/2022   Procedure: CATARACT EXTRACTION PHACO AND INTRAOCULAR LENS PLACEMENT (IOC) RIGHT;  Surgeon: Myrna Adine Anes, MD;  Location: [facility];  Service: Ophthalmology;  Laterality: Right;  8.31 0:49.5   CATARACT EXTRACTION W/PHACO Left 09/16/2022   Procedure: CATARACT EXTRACTION PHACO AND INTRAOCULAR LENS PLACEMENT (IOC) LEFT;  Surgeon: Myrna Adine Anes, MD;  Location: [facility];  Service: Ophthalmology;  Laterality: Left;  13.18 1:10.5   COLONOSCOPY  09/29/2014   Normal, Princella Nida, MD   ELECTROPHYSIOLOGIC STUDY N/A 04/12/2016   Procedure: CARDIOVERSION;  Surgeon: Evalene JINNY Lunger, MD;  Location: [facility];  Service: Cardiovascular;  Laterality: N/A;   ESOPHAGOGASTRODUODENOSCOPY (EGD) WITH PROPOFOL  N/A 09/08/2018   Procedure: ESOPHAGOGASTRODUODENOSCOPY (EGD) WITH PROPOFOL ;  Surgeon: Gaylyn Gladis PENNER,  MD;  Location: [facility] END[facility];  Service: Endoscopy;  Laterality: N/A;   ESOPHAGOGASTRODUODENOSCOPY ENDOSCOPY     EUS N/A 01/12/2015   Procedure: UPPER ENDOSCOPIC ULTRASOUND (EUS) LINEAR;  Surgeon: Toribio SHAUNNA Cedar, MD;  Location: [facility];  Service: Endoscopy;  Laterality: N/A;   TUBAL LIGATION     Family History  Problem Relation Age of Onset   Hypertension Mother    Diabetes Father    Hypertension Father    Hypertension Brother    Cancer Brother        prostate   Heart failure Brother    Heart disease  Brother        with bypass   Colon cancer Neg Hx    Breast cancer Neg Hx    Social History   Occupational History   Occupation: Retired    Associate Professor: RETIRED  Tobacco Use   Smoking status: Never    Passive exposure: Never   Smokeless tobacco: Never  Vaping Use   Vaping status: Never Used  Substance and Sexual Activity   Alcohol use: No    Alcohol/week: 0.0 standard drinks of alcohol   Drug use: No   Sexual activity: Not Currently   Tobacco Counseling Counseling given: Not Answered  SDOH Screenings   Food Insecurity: No Food Insecurity (12/06/2024)  Housing: Low Risk (12/06/2024)  Transportation Needs: No Transportation Needs (12/06/2024)  Utilities: Not At Risk (12/06/2024)  Depression (PHQ2-9): Low Risk (12/06/2024)  Financial Resource Strain: Low Risk (03/28/2022)  Physical Activity: Sufficiently Active (12/06/2024)  Social Connections: Socially Integrated (12/06/2024)  Stress: No Stress Concern Present (12/06/2024)  Tobacco Use: Low Risk (12/06/2024)  Health Literacy: Adequate Health Literacy (12/06/2024)   See flowsheets for full screening details  Depression Screen PHQ 2 & 9 Depression Scale- Over the past 2 weeks, how often have you been bothered by any of the following problems? Little interest or pleasure in doing things: 0 Feeling down, depressed, or hopeless (PHQ Adolescent also includes...irritable): 0 PHQ-2 Total Score: 0 Trouble falling or staying asleep, or sleeping too much: 0 Feeling tired or having little energy: 0 Poor appetite or overeating (PHQ Adolescent also includes...weight loss): 1 (overeating) Feeling bad about yourself - or that you are a failure or have let yourself or your family down: 0 Trouble concentrating on things, such as reading the newspaper or watching television (PHQ Adolescent also includes...like school work): 0 Moving or speaking so slowly that other people could have noticed. Or the opposite - being so fidgety or restless that you have  been moving around a lot more than usual: 0 Thoughts that you would be better off dead, or of hurting yourself in some way: 0 PHQ-9 Total Score: 1 If you checked off any problems, how difficult have these problems made it for you to do your work, take care of things at home, or get along with other people?: Not difficult at all  Depression Treatment Depression Interventions/Treatment : EYV7-0 Score <4 Follow-up Not Indicated     Goals Addressed             This Visit's Progress    Patient Stated   On track    03/28/2022, wants to lose weight             Objective:    Today's Vitals   12/06/24 1316  BP: 130/89  Weight: 192 lb (87.1 kg)  Height: 4' 11 (1.499 m)   Body mass index is 38.78 kg/m.  Hearing/Vision screen Hearing Screening - Comments:: No  difficulties  Vision Screening - Comments:: Patient wears glasses / Dr Laurice in [place]  Immunizations and Health Maintenance Health Maintenance  Topic Date Due   Medicare Annual Wellness (AWV)  04/14/2024   COVID-19 Vaccine (5 - 2025-26 season) 10/07/2027 (Originally 07/12/2024)   Mammogram  05/17/2025   DTaP/Tdap/Td (2 - Td or Tdap) 11/08/2031   Pneumococcal Vaccine: 50+ Years  Completed   Influenza Vaccine  Completed   Bone Density Scan  Completed   Hepatitis C Screening  Completed   Zoster Vaccines- Shingrix  Completed   Meningococcal B Vaccine  Aged Out   Colonoscopy  Discontinued        Assessment/Plan:  This is a routine wellness examination for Kerry Simpson.  Patient Care Team: Tower, Laine LABOR, MD as PCP - General Cindie Ole DASEN, MD (Inactive) as PCP - Electrophysiology (Cardiology) Tower, Laine LABOR, MD as Consulting Physician (Family Medicine) Dessa, Reyes ORN, MD (General Surgery) Perla Evalene PARAS, MD as Consulting Physician (Cardiology) Laurice Francis NOVAK, OD [facility])  I have personally reviewed and noted the following in the patients chart:   Medical and social history Use of alcohol, tobacco  or illicit drugs  Current medications and supplements including opioid prescriptions. Functional ability and status Nutritional status Physical activity Advanced directives List of other physicians Hospitalizations, surgeries, and ER visits in previous 12 months Vitals Screenings to include cognitive, depression, and falls Referrals and appointments  No orders of the defined types were placed in this encounter.  In addition, I have reviewed and discussed with patient certain preventive protocols, quality metrics, and best practice recommendations. A written personalized care plan for preventive services as well as general preventive health recommendations were provided to patient.   Lyle MARLA Right, [place]   12/06/2024   No follow-ups on file.  After Visit Summary: (MyChart) Due to this being a telephonic visit, the after visit summary with patients personalized plan was offered to patient via MyChart   Nurse Notes:No voiced concerns at this time  "
